# Patient Record
Sex: Female | Born: 1940 | Race: White | Hispanic: No | Marital: Married | Smoking: Never smoker
Health system: Southern US, Academic
[De-identification: ages and names within clinical notes are randomized; demographics above are authoritative.]

## PROBLEM LIST (undated history)

## (undated) DIAGNOSIS — Z973 Presence of spectacles and contact lenses: Secondary | ICD-10-CM

## (undated) DIAGNOSIS — G43909 Migraine, unspecified, not intractable, without status migrainosus: Secondary | ICD-10-CM

## (undated) DIAGNOSIS — M1990 Unspecified osteoarthritis, unspecified site: Secondary | ICD-10-CM

## (undated) DIAGNOSIS — F32A Depression, unspecified: Secondary | ICD-10-CM

## (undated) DIAGNOSIS — K219 Gastro-esophageal reflux disease without esophagitis: Secondary | ICD-10-CM

## (undated) DIAGNOSIS — R6889 Other general symptoms and signs: Secondary | ICD-10-CM

## (undated) DIAGNOSIS — G8929 Other chronic pain: Secondary | ICD-10-CM

## (undated) DIAGNOSIS — M545 Low back pain, unspecified: Secondary | ICD-10-CM

## (undated) DIAGNOSIS — R0602 Shortness of breath: Secondary | ICD-10-CM

## (undated) DIAGNOSIS — R413 Other amnesia: Secondary | ICD-10-CM

## (undated) HISTORY — DX: Depression, unspecified: F32.A

## (undated) HISTORY — DX: Unspecified osteoarthritis, unspecified site: M19.90

## (undated) HISTORY — PX: HX BREAST AUGMENTATION: SHX7

## (undated) HISTORY — DX: Gastro-esophageal reflux disease without esophagitis: K21.9

## (undated) HISTORY — PX: CYSTOURETHROSCOPY: SHX476

## (undated) HISTORY — DX: Other amnesia: R41.3

## (undated) HISTORY — DX: Migraine, unspecified, not intractable, without status migrainosus: G43.909

## (undated) HISTORY — PX: SACROILIAC JOINT INJECTION: SHX2370

## (undated) HISTORY — DX: Other general symptoms and signs: R68.89

## (undated) HISTORY — DX: Low back pain, unspecified: M54.50

## (undated) HISTORY — PX: HX CERVICAL SPINE SURGERY: 2100001197

## (undated) HISTORY — PX: HX ROTATOR CUFF REPAIR: SHX139

## (undated) HISTORY — PX: TOE AMPUTATION: SHX809

## (undated) HISTORY — PX: BREAST IMPLANT REMOVAL: SUR1101

## (undated) HISTORY — PX: FOOT SURGERY: SHX648

---

## 2018-01-06 ENCOUNTER — Ambulatory Visit (HOSPITAL_COMMUNITY)
Admission: RE | Admit: 2018-01-06 | Discharge: 2018-01-06 | Disposition: A | Discharge status: Home / Self Care | Payer: Self-pay | Source: Ambulatory Visit | Admitting: Radiology

## 2018-01-14 ENCOUNTER — Ambulatory Visit (HOSPITAL_COMMUNITY)
Admission: RE | Admit: 2018-01-14 | Discharge: 2018-01-14 | Disposition: A | Discharge status: Home / Self Care | Payer: Self-pay | Source: Ambulatory Visit | Admitting: Radiology

## 2019-05-07 ENCOUNTER — Ambulatory Visit (HOSPITAL_COMMUNITY)
Admission: RE | Admit: 2019-05-07 | Discharge: 2019-05-07 | Disposition: A | Discharge status: Home / Self Care | Payer: Self-pay | Source: Ambulatory Visit | Admitting: Radiology

## 2020-06-20 ENCOUNTER — Ambulatory Visit: Payer: Medicare Other

## 2020-10-13 IMAGING — MR MRI RIGHT SHOULDER WITHOUT CONTRAST
4 of 7 series · 21 of 40 positions shown · IV contrast (gadolinium)
Comparison: None

MRI RIGHT SHOULDER WITHOUT CONTRAST, 10/13/2020 [DATE]: 
CLINICAL INDICATION: Chronic right shoulder pain. Right shoulder surgery 11 
years ago (report not available at this time).
TECHNIQUE: Multiplanar, multiecho position MR images of the right shoulder were 
performed without intravenous gadolinium enhancement.

[Series 101: survey_fullfov_transversal · axial · 10.0mm · 1.84mm/px · z∈[-51,+51]mm · 2 of 7 slices shown]
[im 1/7]
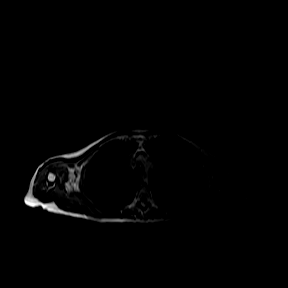
[im 7/7]
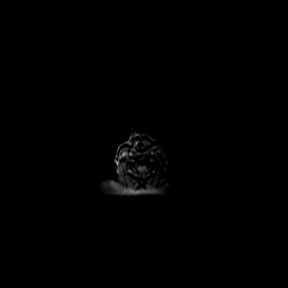

[Series 201: (person_name)2(person_name)_(person_name) · axial · 6.0mm · 0.61mm/px · z∈[-13,+191]mm · 4 of 15 slices shown]
[im 1/15]
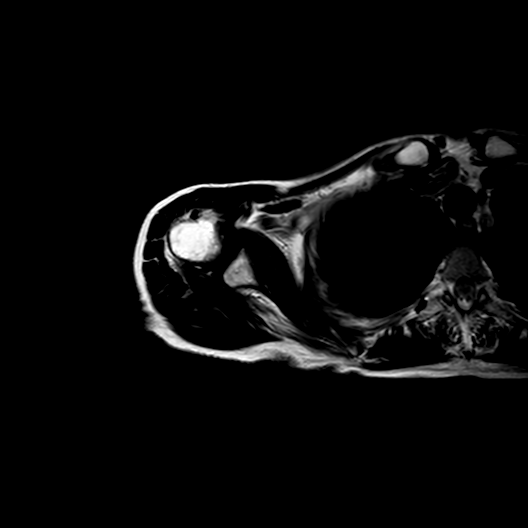
[im 5/15]
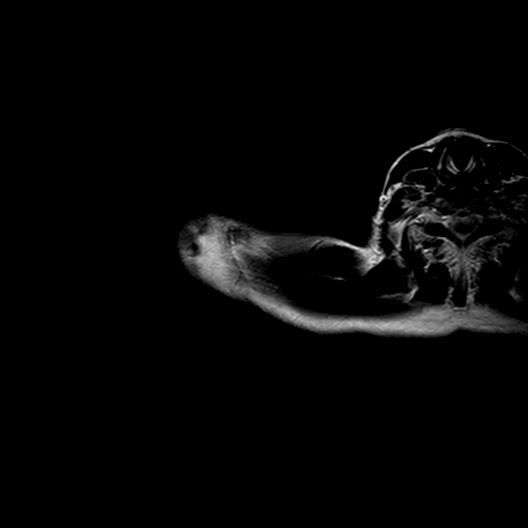
[im 10/15]
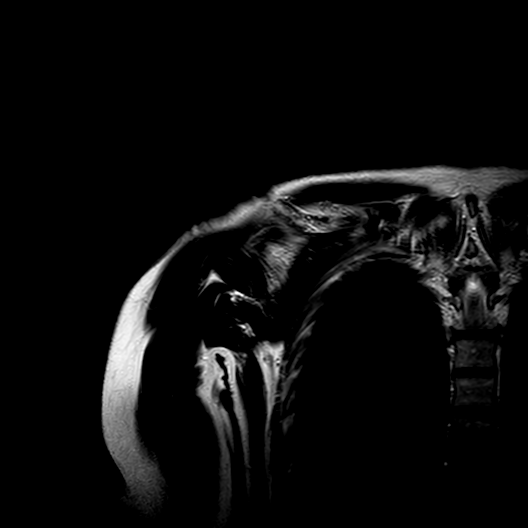
[im 15/15]
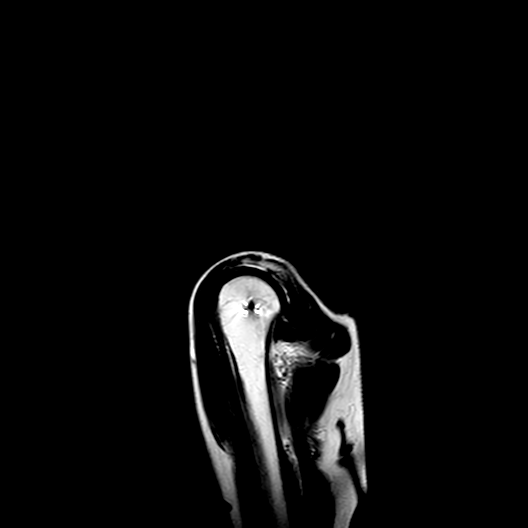

[Series 301: (person_name)_(person_name)_(person_name)* · axial · 3.0mm · 0.35mm/px · z∈[-48,+41]mm · 8 of 28 slices shown]
[im 1/28]
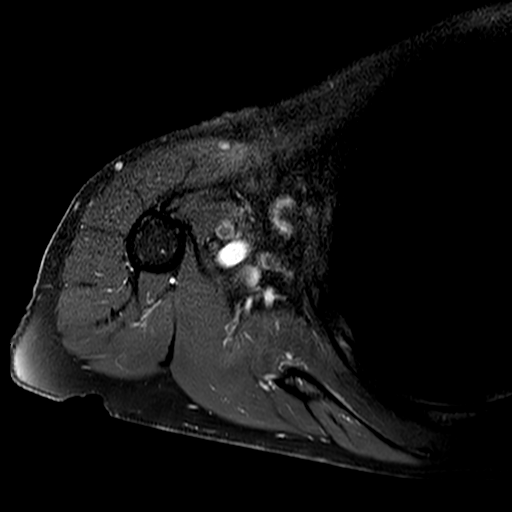
[im 4/28]
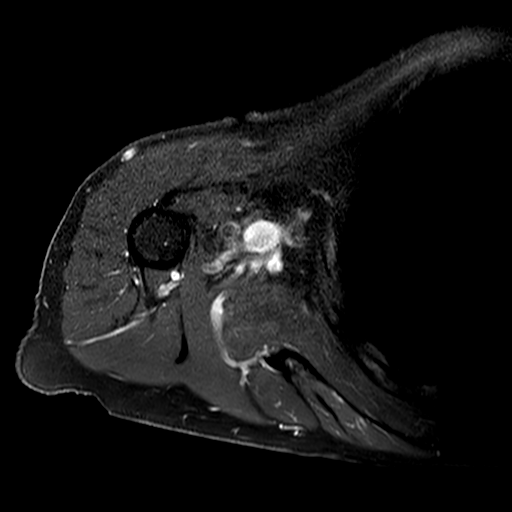
[im 8/28]
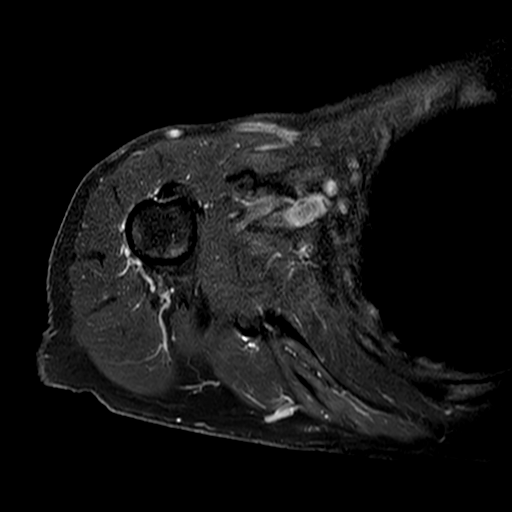
[im 12/28]
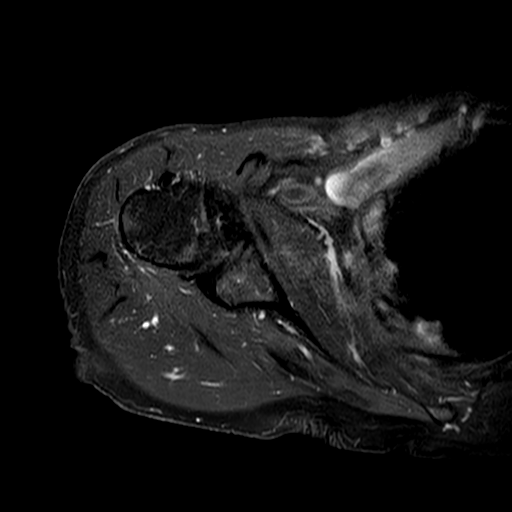
[im 16/28]
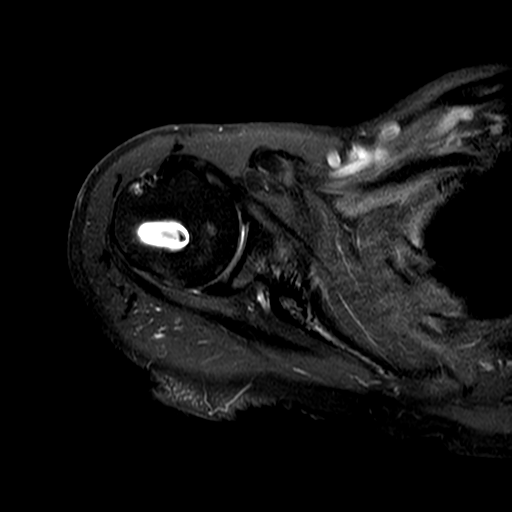
[im 20/28]
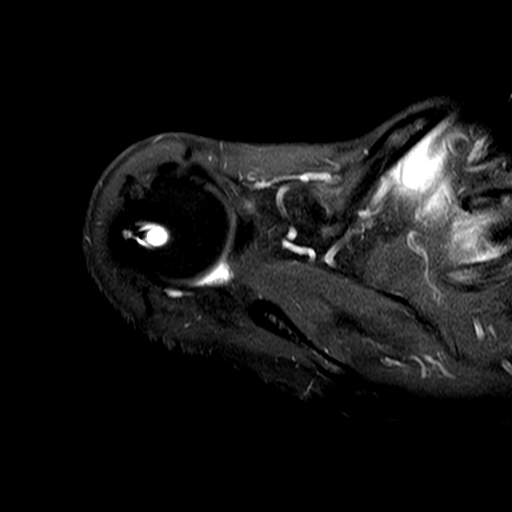
[im 24/28]
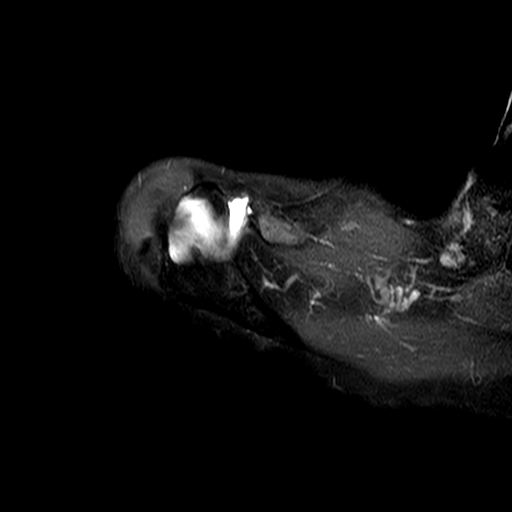
[im 28/28]
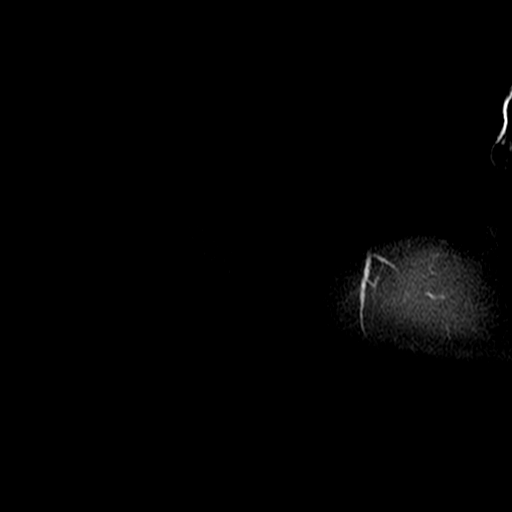

[Series 401: t2_fs_sag* · oblique · 3.0mm · 0.37mm/px · 7 of 24 slices shown]
[im 1/24]
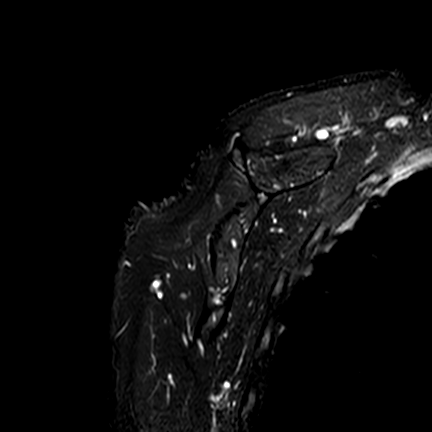
[im 4/24]
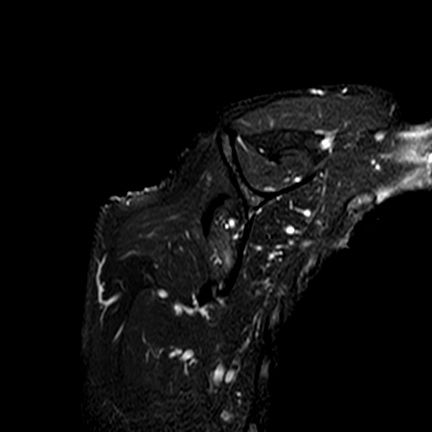
[im 8/24]
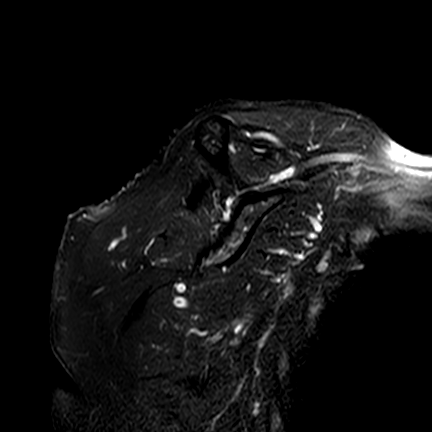
[im 12/24]
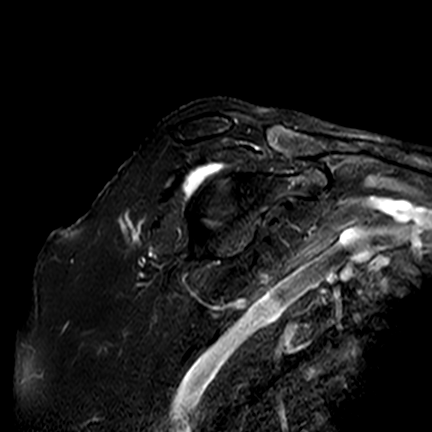
[im 16/24]
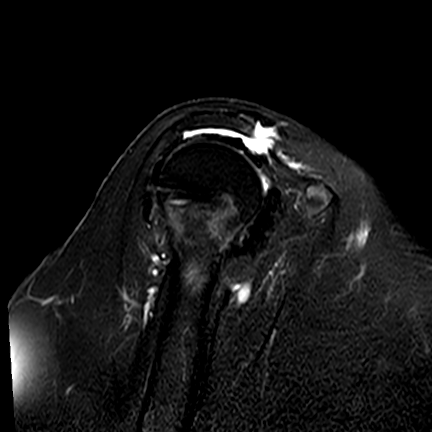
[im 20/24]
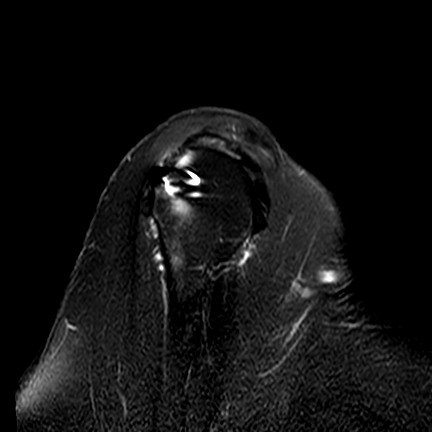
[im 24/24]
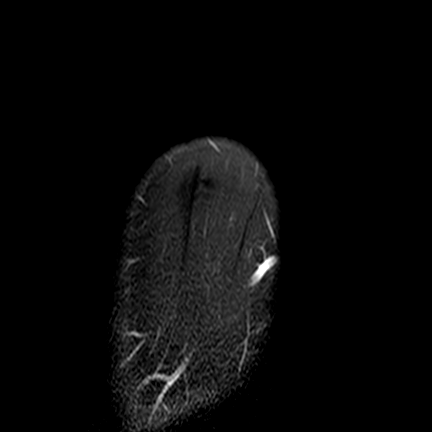

[21 of 40 positions shown; findings below may reference images not displayed]

FINDINGS: ROTATOR CUFF: Status post rotator cuff repair with 0.9 cm segment of marked 
thinning and small full-thickness tears of the reattached superior fibers of the 
infraspinatus tendon (sequence 401 image 7). Small near full-thickness tear of 
the critical zone of the supraspinatus tendon measures 0.4 cm in AP dimensions 
(sequence 601 image 10). The subscapularis and teres minor tendons are 
preserved. Moderate supraspinatus and infraspinatus fatty muscular atrophy. 
ACROMIOCLAVICULAR JOINT: The acromioclavicular joint is preserved. The 
coracoacromial ligament is intact without prominent spurring at the acromial 
attachment. Ruptured inferior acromioclavicular ligament. Coracoclavicular 
ligament is preserved. Acromioplasty. 
GLENOHUMERAL JOINT: The humeral head is well located within the glenoid fossa. 
The glenoid labrum is preserved. No paralabral cyst. The intra-articular portion 
of the long head of the biceps tendon is negative. No shoulder joint effusion. 
BONES AND SOFT TISSUES: Metallic suture anchors in the lateral humeral head. 1 
cm focus of edema-like signal changes within the greater tuberosity of the 
humerus. No fracture. No Hill-Sachs defect. Small humeral head subcortical cyst. 
Small amount of joint fluid and small amount of subacromial/subdeltoid bursal 
fluid, with extension into the AC joint. The axillary region is negative. 
Subcutaneous tissues are negative.
IMPRESSION: 1.  Status post rotator cuff repair and acromioplasty, with 0.9 cm segment of 
marked thinning and small full-thickness tears of the reattached superior fibers 
of the infraspinatus tendon. 
2.  0.4 cm near full-thickness tear of the critical zone of the supraspinatus 
tendon. 
3.  Moderate supraspinatus and infraspinatus fatty muscular atrophy. 
4.  Ruptured inferior acromioclavicular ligament. 
5.  1 cm focus of edema-like signal changes within the greater tuberosity of the 
humerus.

## 2020-12-30 HISTORY — PX: ANTERIOR FUSION CERVICAL SPINE: SUR626

## 2021-01-12 HISTORY — PX: HX CATARACT REMOVAL: SHX102

## 2021-03-27 ENCOUNTER — Ambulatory Visit (HOSPITAL_COMMUNITY)
Admission: RE | Admit: 2021-03-27 | Discharge: 2021-03-27 | Disposition: A | Discharge status: Home / Self Care | Payer: Self-pay | Source: Ambulatory Visit | Admitting: Radiology

## 2021-07-09 IMAGING — MR MRI CERVICAL SPINE WITHOUT CONTRAST
4 of 6 series · 24 of 48 positions shown · IV contrast (gadolinium)
Comparison: None

FINAL Diagnostic Imaging Report 
________________________________________________________________________________________________ 
MRI CERVICAL SPINE WITHOUT CONTRAST, 07/09/2021 [DATE]: 
CLINICAL INDICATION: Spinal stenosis
TECHNIQUE: Multiplanar, multiecho position MR images of the cervical spine were 
performed without intravenous gadolinium enhancement. Patient was scanned on a 
1.5T magnet.

[Series 101: survey* · axial · 10.0mm · 1.56mm/px · z∈[-30,+199]mm · 7 of 15 slices shown]
[im 1/15]
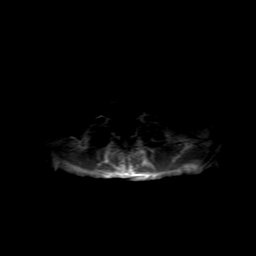
[im 3/15]
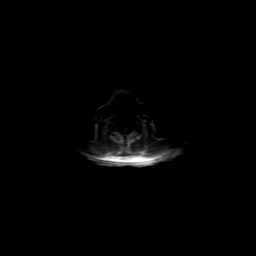
[im 5/15]
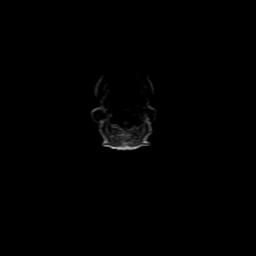
[im 8/15]
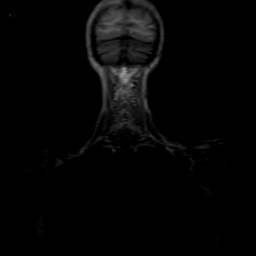
[im 10/15]
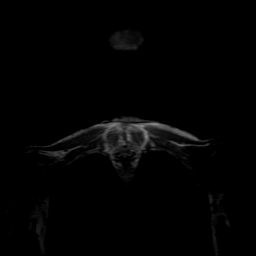
[im 12/15]
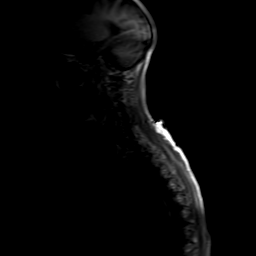
[im 15/15]
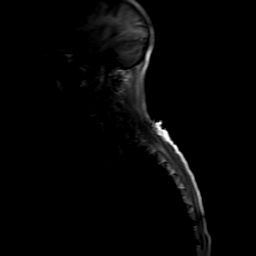

[Series 201: t2w_cor-surv · coronal · 5.0mm · 0.85mm/px · 3 of 7 slices shown]
[im 1/7]
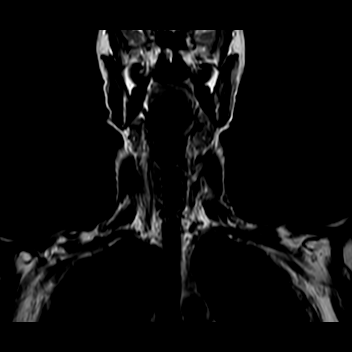
[im 4/7]
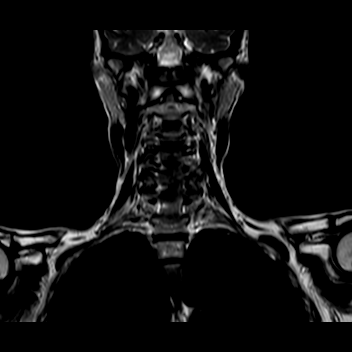
[im 7/7]
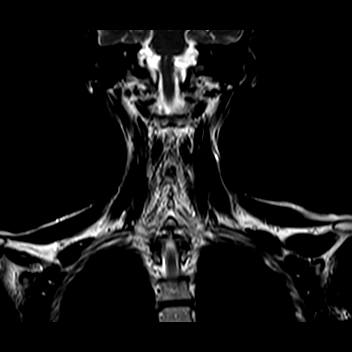

[Series 301: t1_(person_name) · sagittal · 3.0mm · 0.42mm/px · 7 of 15 slices shown]
[im 1/15]
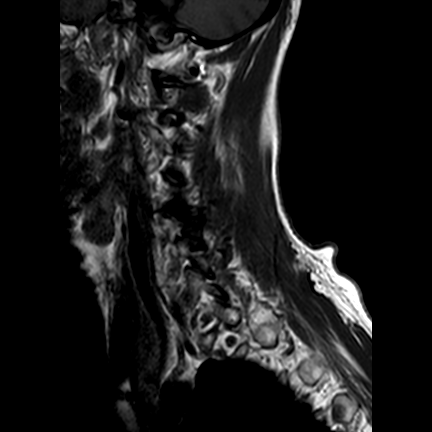
[im 3/15]
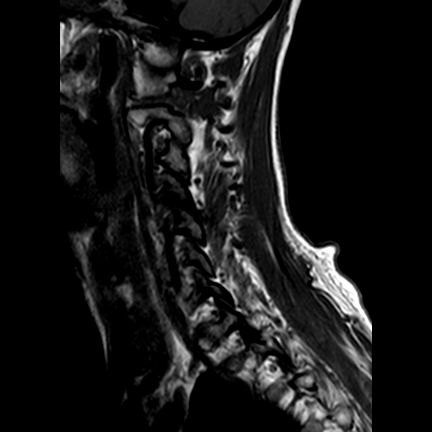
[im 5/15]
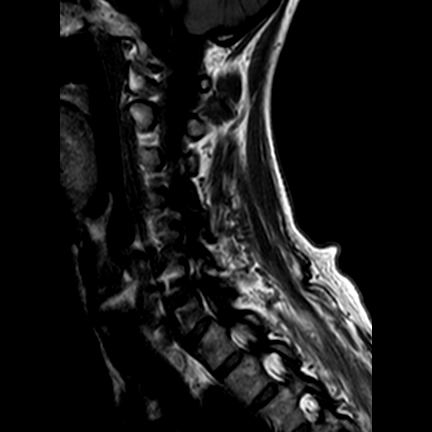
[im 8/15]
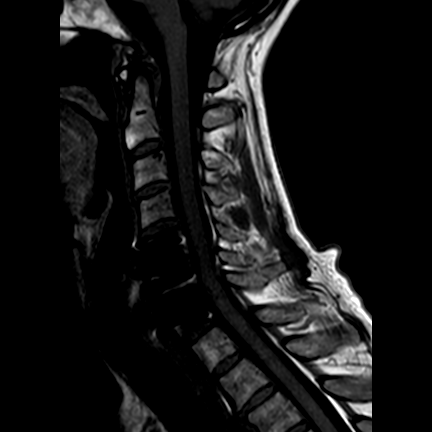
[im 10/15]
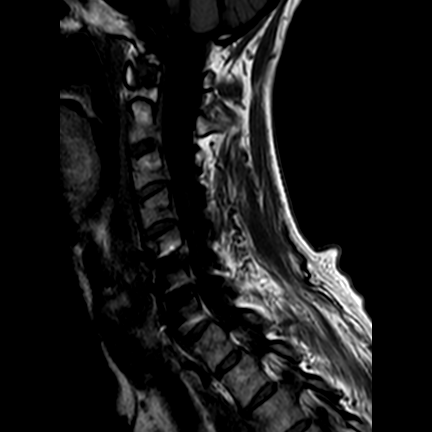
[im 12/15]
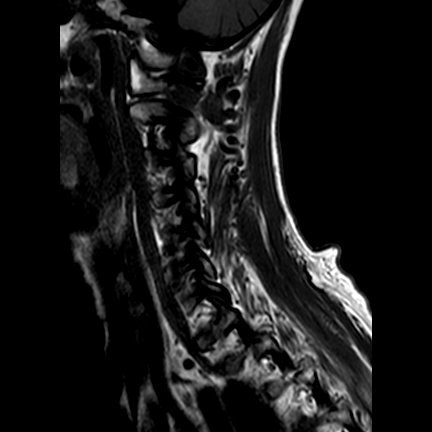
[im 15/15]
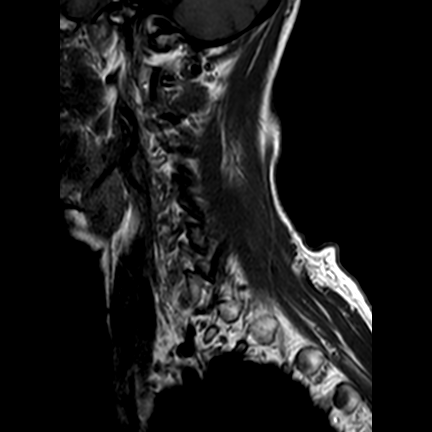

[Series 401: t2w_mv_xd_sag · sagittal · 3.0mm · 0.31mm/px · 7 of 15 slices shown]
[im 1/15]
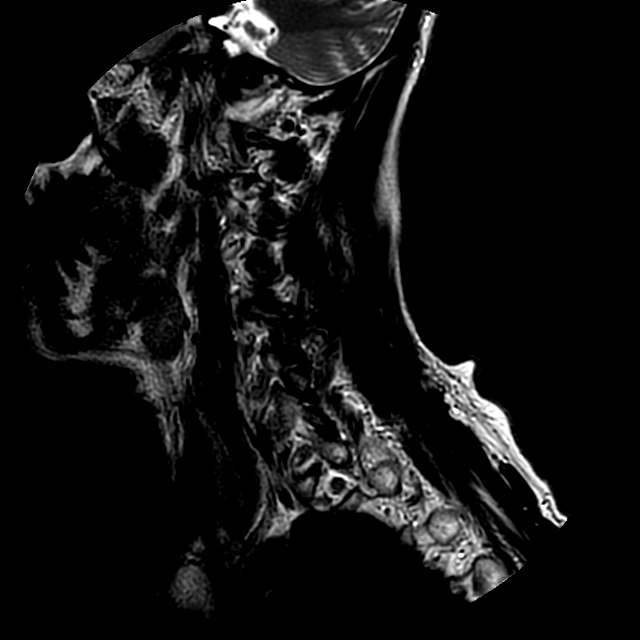
[im 3/15]
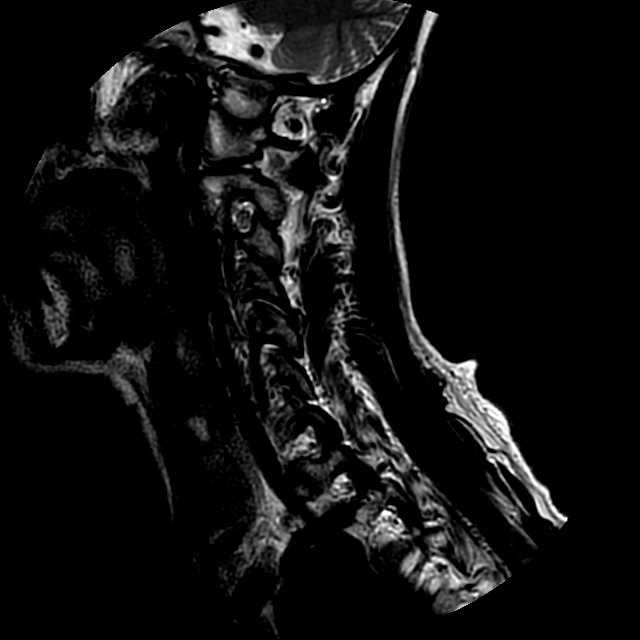
[im 5/15]
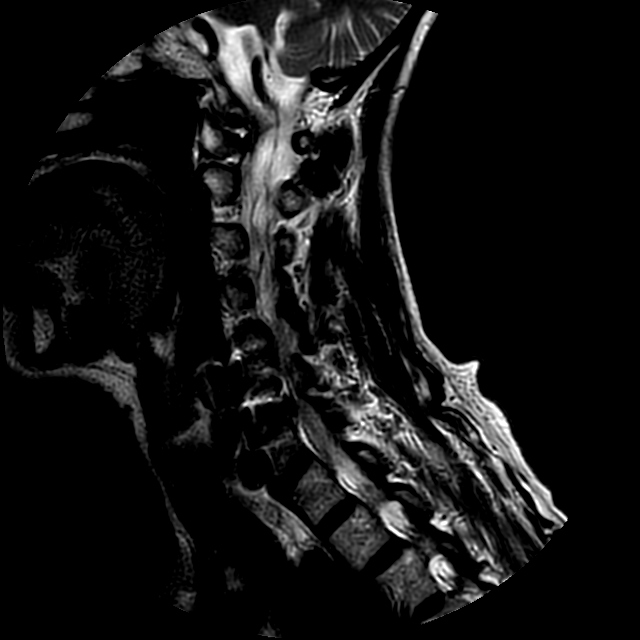
[im 8/15]
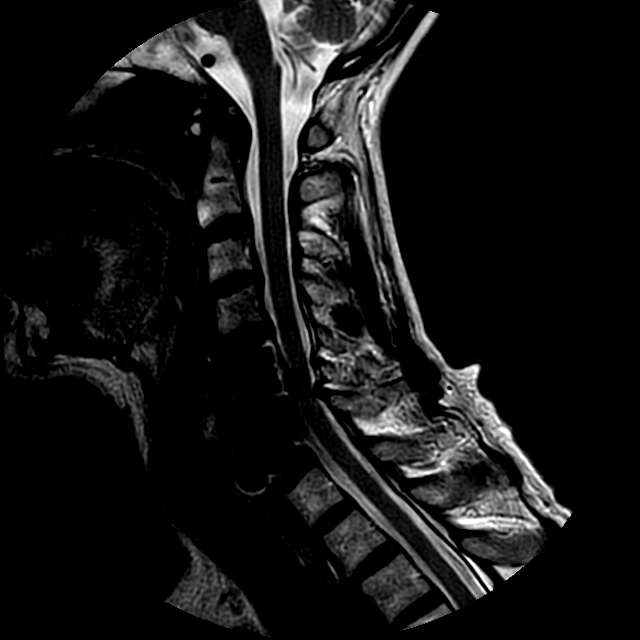
[im 10/15]
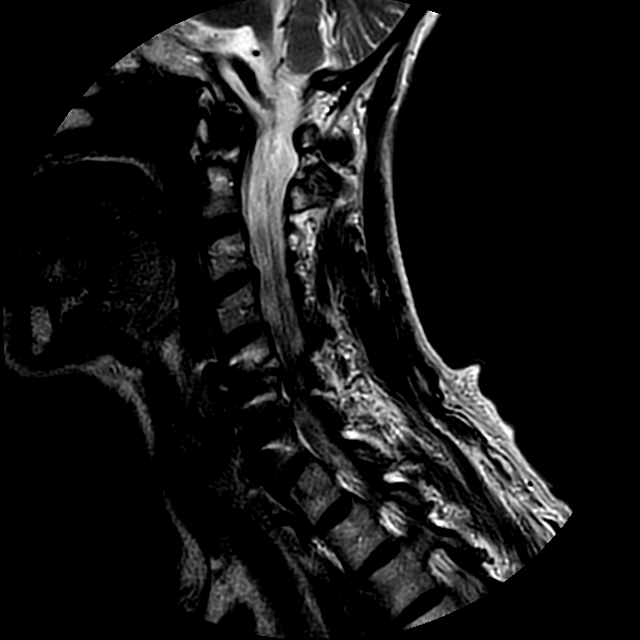
[im 12/15]
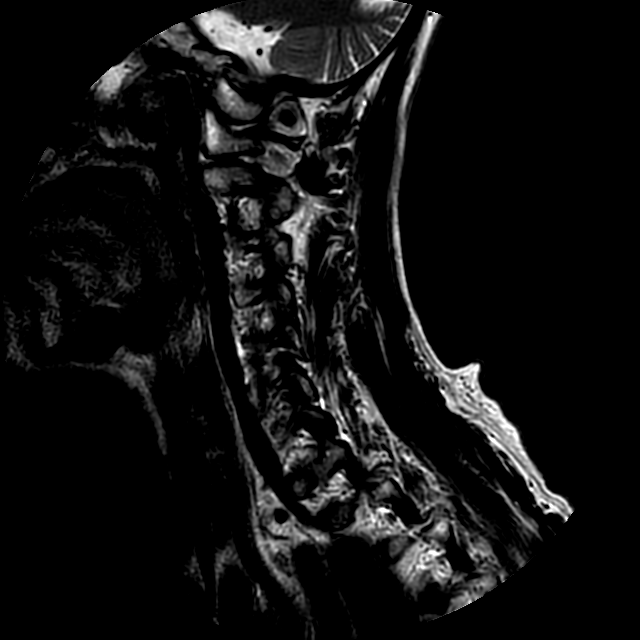
[im 15/15]
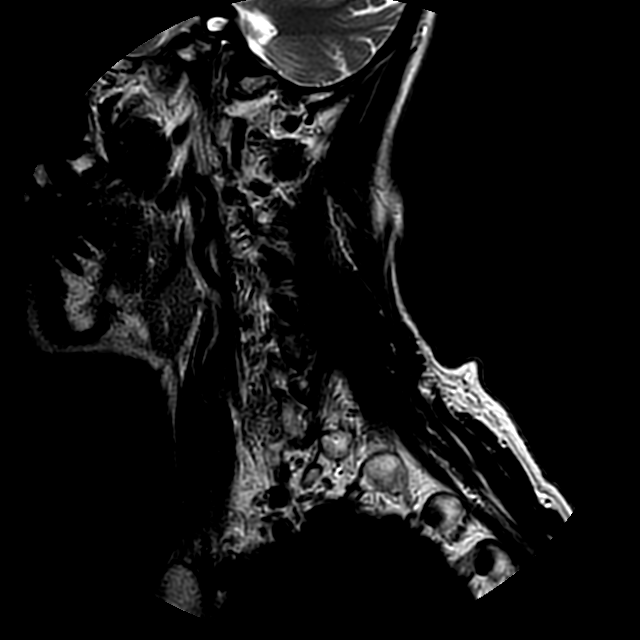

[24 of 48 positions shown; findings below may reference images not displayed]

FINDINGS: Patient is status post C5-C7 ACDF with anterior plate and screw 
fusion. Susceptibility artifact limits detail. Hardware appears grossly 
well-positioned. 
There is moderately severe canal stenosis opposite C5-6 due to posterior 
osteophyte and dorsal ligament is thickening, with cord deformity. Canal 
diameter proximally 6 mm measured on sagittal T2 image 9, 5.5 mm on sagittal 
image 8. There is moderate left, mild right foraminal stenosis at C5-6, axial 
image 21. At C6-7 there appears to be moderate left, mild right foraminal 
stenosis, image 16. 
At C2-3 the canal and foramina are open. 
At C3-4 there is mild disc bulge not touching the cord. Mid sagittal canal 
diameter is 9 mm, mildly stenotic. Foramina are open. 
At C4-5 there is mild disc bulge approximating the cord without deformity. The 
canal is borderline narrow. Foramina are open. 
Opposite C6-7 Canal diameter is 8 mm, mildly stenotic. 
At C7-T1 the canal and foramina are open. 
The dens is intact. No evidence for fracture or malignancy. Cord signal appears 
normal.
IMPRESSION: Patient is status post C5-C7 ACDF with plate and screw fusion. Allowing for 
susceptibility artifact hardware appears appropriately positioned. 
There is moderately severe canal stenosis opposite C5-6, with deformity of the 
ventral and dorsal cord. There is foraminal stenosis at C5-6 and C6-7, moderate 
on the left, mild on the right at both levels.

## 2021-07-10 IMAGING — CT CT CERVICAL SPINE WITHOUT CONTRAST
5 series · 15 of 33 positions shown, 17 images · non-contrast
Comparison: MRI cervical spine from June 09, 2021.

FINAL Diagnostic Imaging Report 
________________________________________________________________________________________________ 
CT CERVICAL SPINE WITHOUT CONTRAST, 07/10/2021 [DATE]: 
CLINICAL INDICATION: Cervical fusion. 
A search for DICOM formatted images was conducted for prior CT imaging studies 
completed at a non-affiliated media free facility.
TECHNIQUE: The cervical spine was scanned from the skull base through T1 
vertebra without contrast on a high-resolution CT scanner using dose reduction 
techniques. Routing MPR reconstructions were performed.

[Series 3: axial · axial · 0.33mm/px · z∈[-171,-101]mm · 2 of 107 slices shown]
[im 36/107  bone]
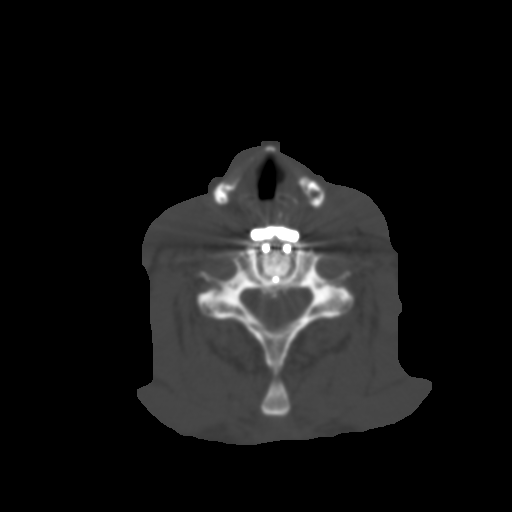
[im 71/107  bone]
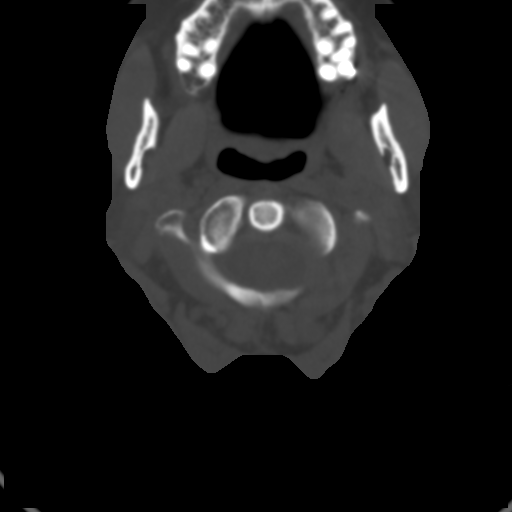

[Series 5: axial (person_name) · axial · 0.33mm/px · z∈[-189,-83]mm · 3 of 107 slices shown, 4 images]
[im 27/107  soft-tissue]
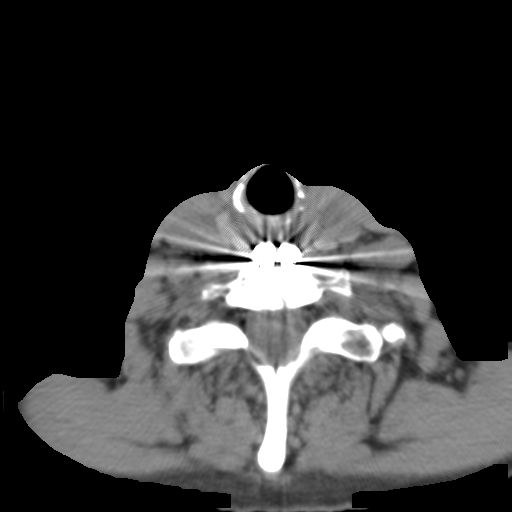
[im 27/107  bone]
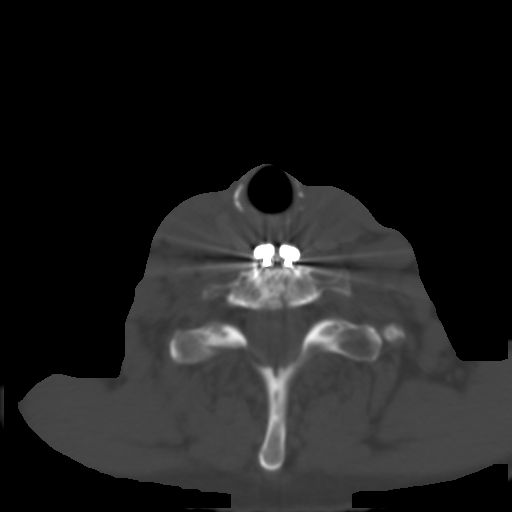
[im 54/107  bone]
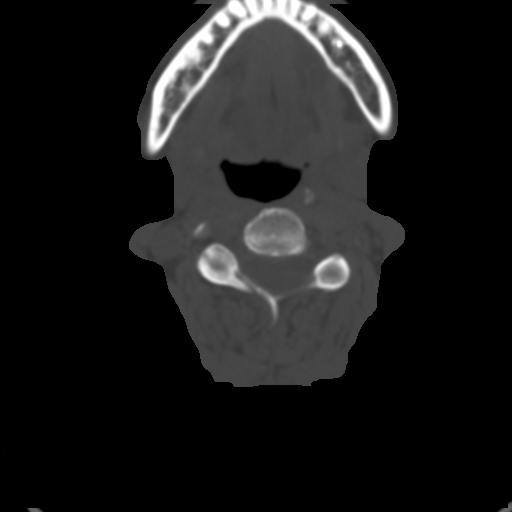
[im 80/107  bone]
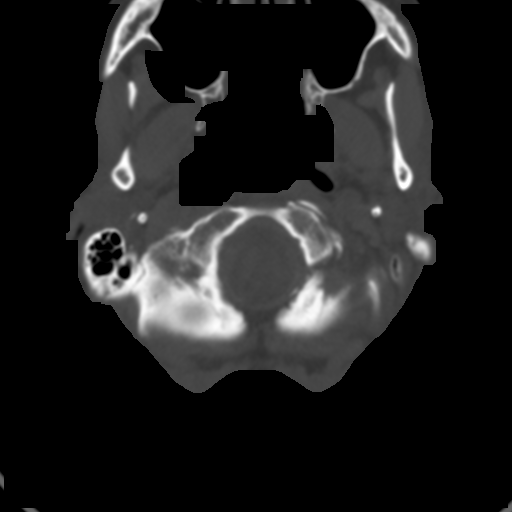

[Series 7: cor · coronal · 0.32mm/px · 3 of 66 slices shown]
[im 14/66  bone]
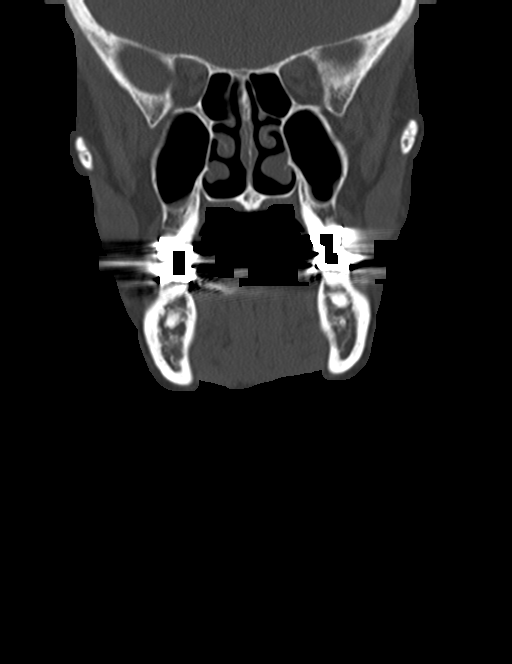
[im 27/66  bone]
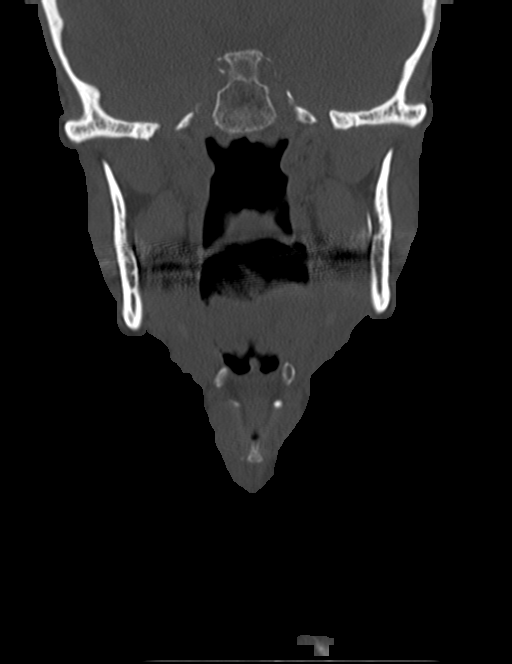
[im 40/66  bone]
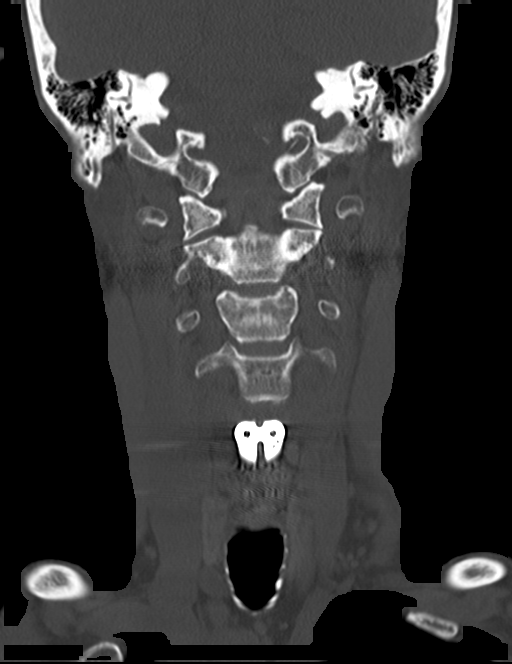

[Series 9: sag st · sagittal · 0.37mm/px · 5 of 76 slices shown, 6 images]
[im 26/76  bone]
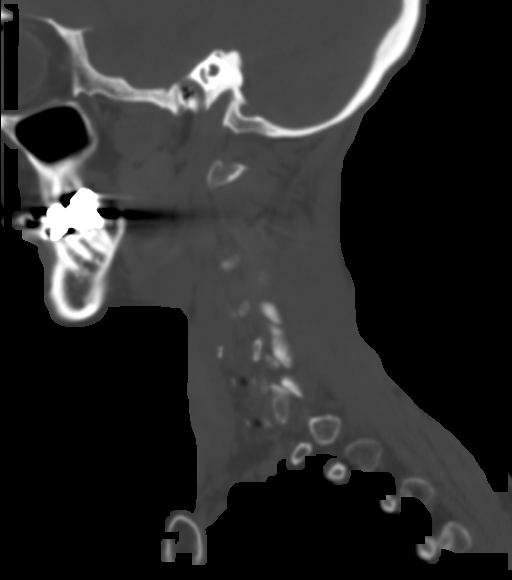
[im 32/76  bone]
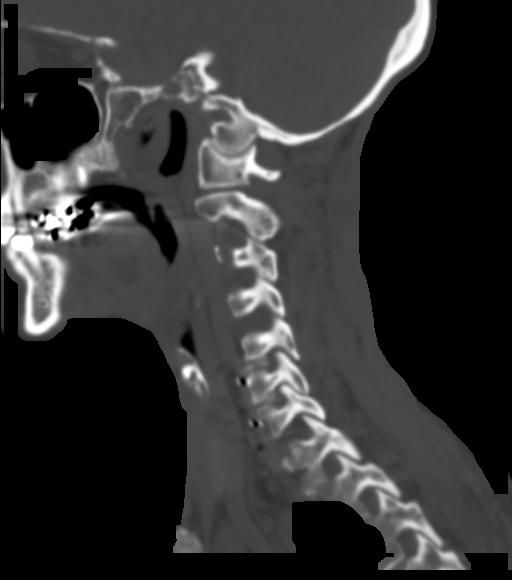
[im 38/76  soft-tissue]
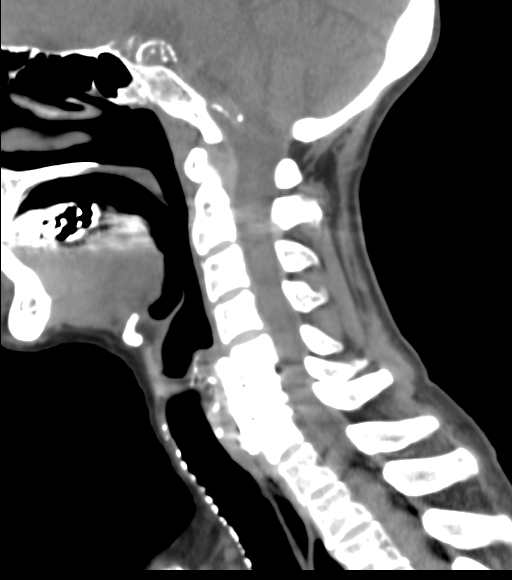
[im 38/76  bone]
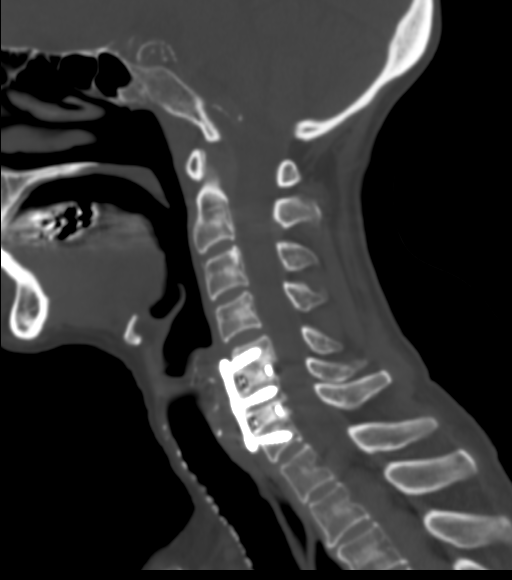
[im 44/76  bone]
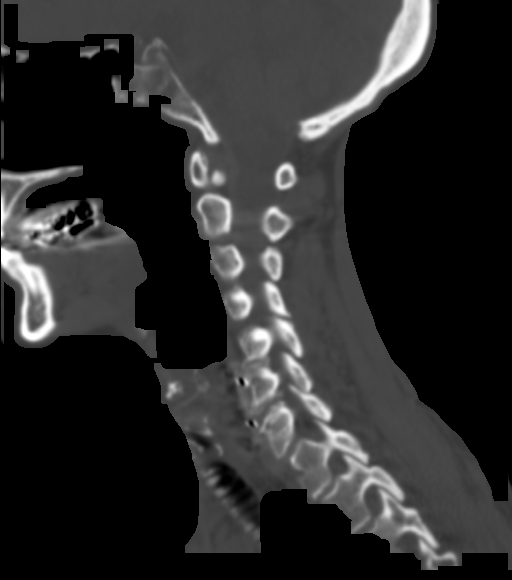
[im 51/76  bone]
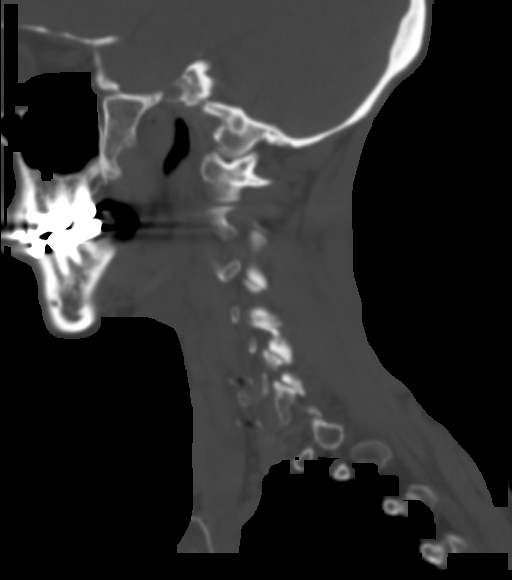

[Series 11: st multi (person_name) · axial · 0.29mm/px · z∈[-230,-189]mm · 2 of 118 slices shown]
[im 30/118  bone]
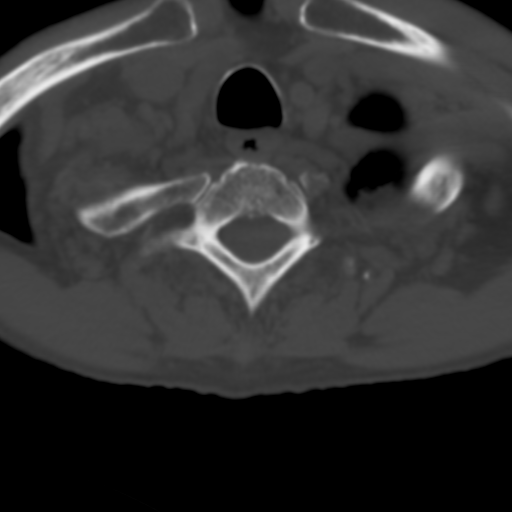
[im 59/118  bone]
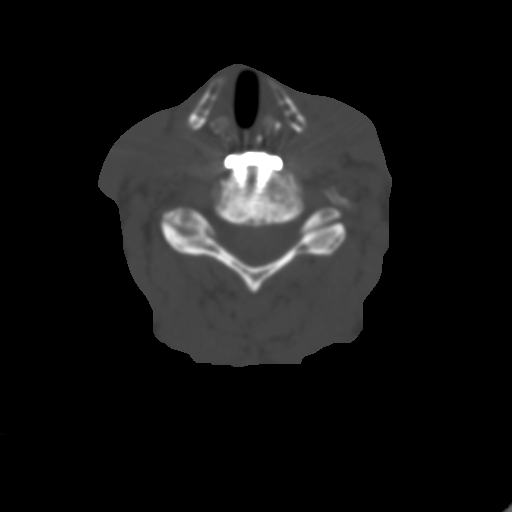

[15 of 33 positions shown; findings below may reference images not displayed]

FINDINGS: --------------------------------------------------------------------------- 
Postsurgical changes of anterior cervical discectomy and fusion from C5 through 
C7. There is no evidence of hardware fracture. No evidence of hardware 
loosening. Within the intervertebral spacer there is evidence of bony fusion 
across the disc spaces at C5-C6 and C6-C7. 
--------------------------------------------------------------------------- 
Alignment of cervical spine demonstrates mild anterolisthesis of C4 relative to 
C5. No acute cervical spine fracture. No focal suspect lytic or blastic lesions. 
Visualized extraspinal soft tissues are unremarkable. The lung apices are clear. 
Discogenic/degenerative changes, stable from recent MRI, please see that report 
for level by level details. 
---------------------------------------------------------------------------
IMPRESSION: Postsurgical changes of ACDF from C5 through C7 without evidence of hardware 
failure. No acute fracture. 
RADIATION DOSE REDUCTION: All CT scans are performed using radiation dose 
reduction techniques, when applicable.  Technical factors are evaluated and 
adjusted to ensure appropriate moderation of exposure.  Automated dose 
management technology is applied to adjust the radiation doses to minimize 
exposure while achieving diagnostic quality images.

## 2021-07-24 IMAGING — MR MRI LUMBAR SPINE WITHOUT CONTRAST
5 of 8 series · 20 of 48 positions shown · IV contrast (gadolinium)
Comparison: None.

FINAL Diagnostic Imaging Report 
________________________________________________________________________________________________ 
MRI LUMBAR SPINE WITHOUT CONTRAST, 07/24/2021 [DATE]: 
CLINICAL INDICATION: Chronic low back pain radiating down bilateral legs to the 
toes.
TECHNIQUE: Multiplanar, multiecho position MR images of the lumbar spine were 
performed without intravenous gadolinium enhancement. Patient was scanned on a 
1.5T magnet.

[Series 101: survey · axial · 10.0mm · 1.39mm/px · z∈[-33,+201]mm · 2 of 10 slices shown]
[im 1/10]
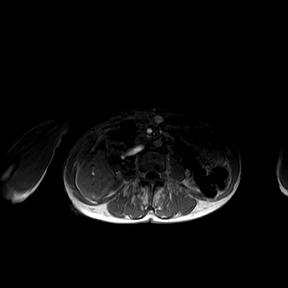
[im 10/10]
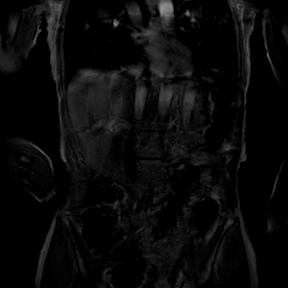

[Series 201: t2w_cor-surv · coronal · 6.0mm · 0.60mm/px · 2 of 5 slices shown]
[im 1/5]
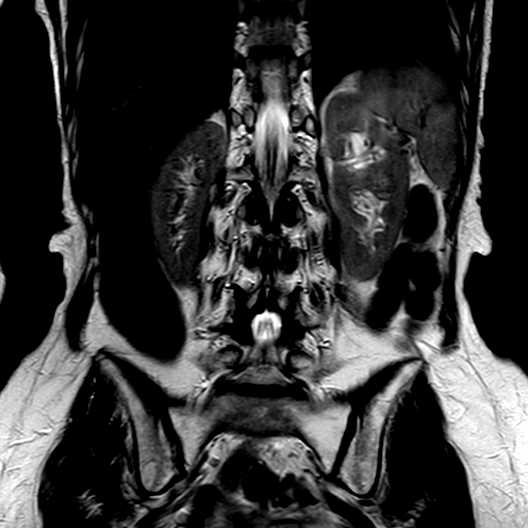
[im 5/5]
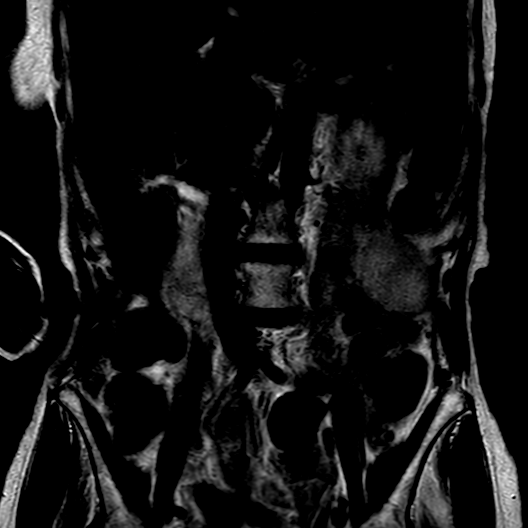

[Series 301: t1_tse_sag · sagittal · 4.0mm · 0.44mm/px · 5 of 15 slices shown]
[im 1/15]
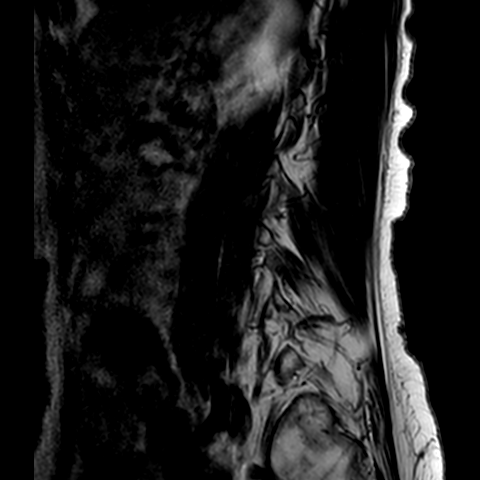
[im 4/15]
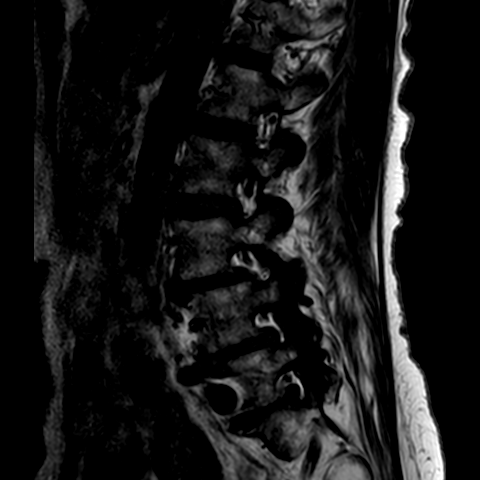
[im 8/15]
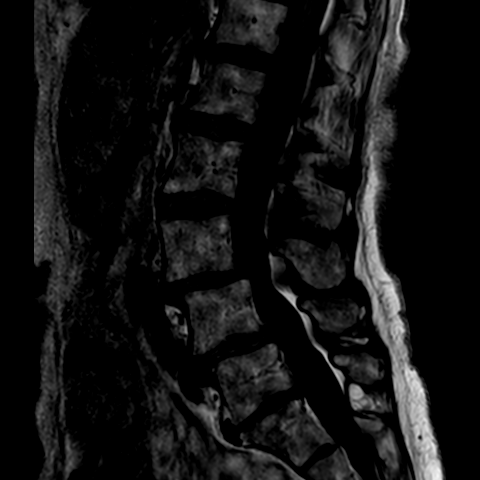
[im 11/15]
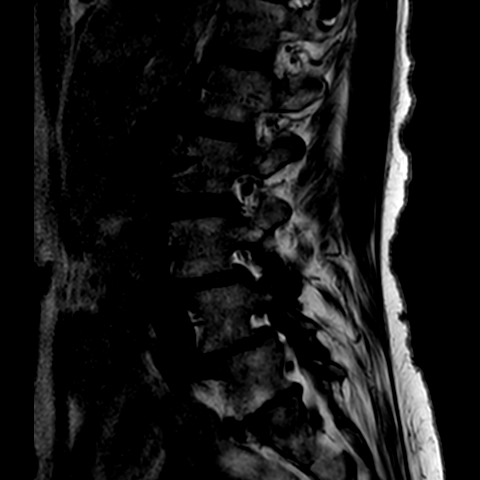
[im 15/15]
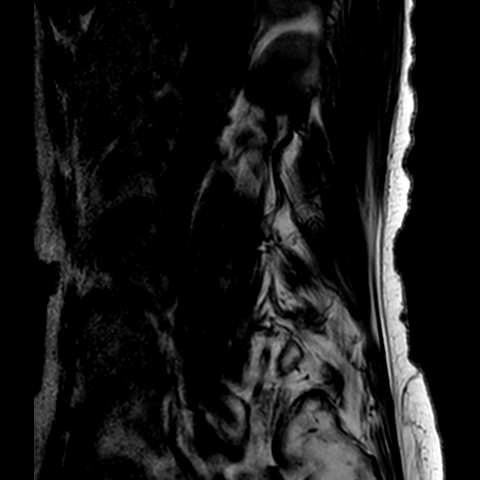

[Series 401: t2_tse_sag · sagittal · 4.0mm · 0.38mm/px · 3 of 15 slices shown]
[im 1/15]
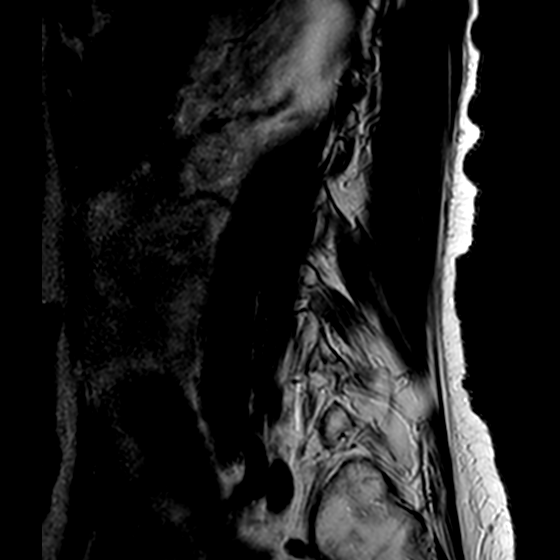
[im 4/15]
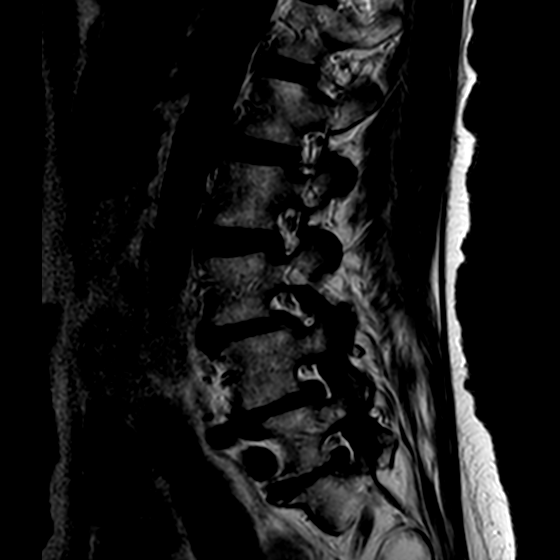
[im 8/15]
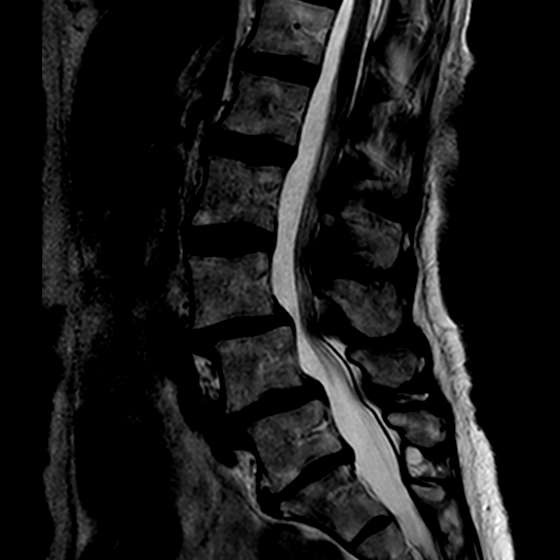

[Series 701: T1 · axial · 4.0mm · 0.38mm/px · z∈[-135,+55]mm · 8 of 27 slices shown]
[im 1/27]
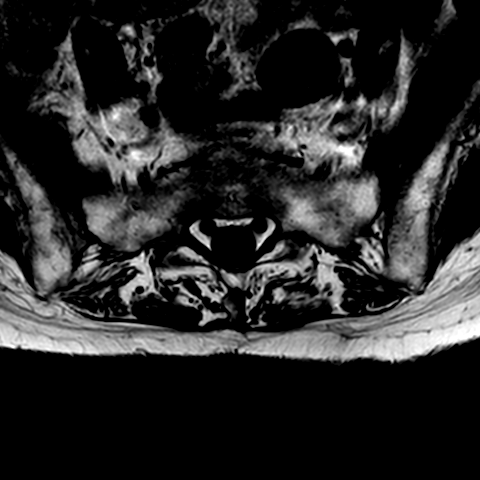
[im 4/27]
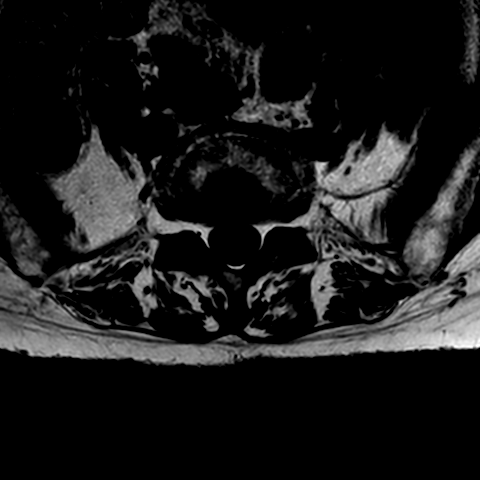
[im 8/27]
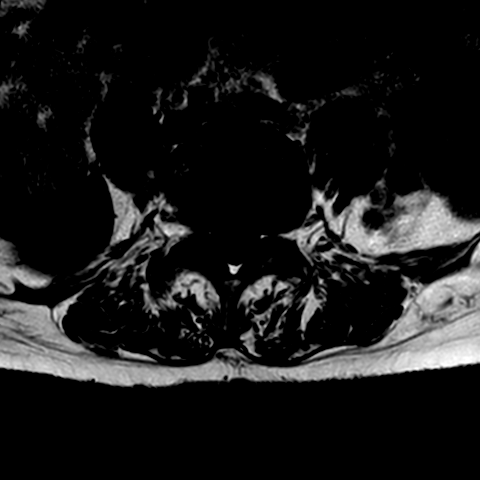
[im 12/27]
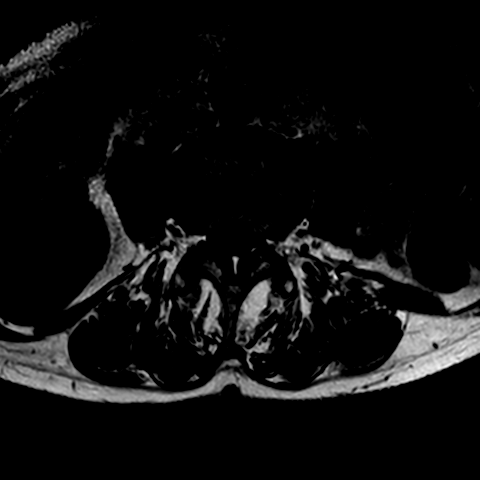
[im 15/27]
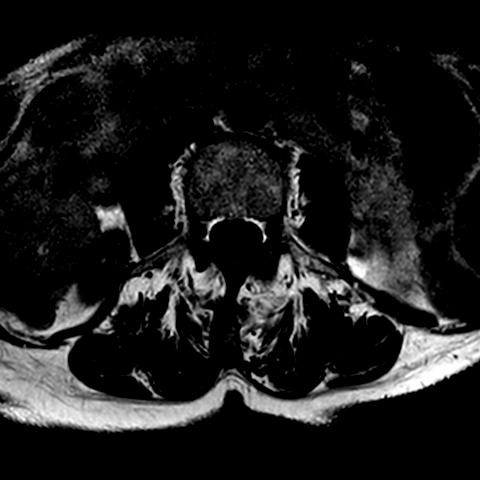
[im 19/27]
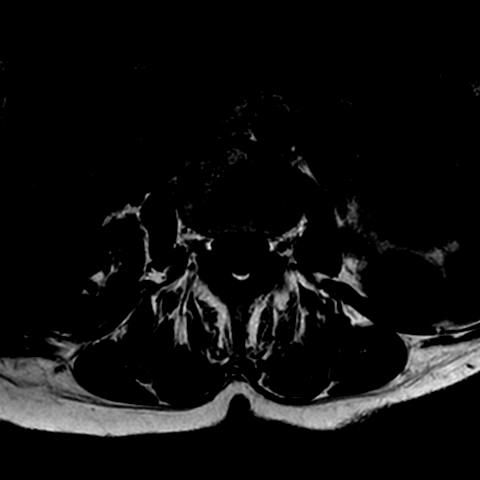
[im 23/27]
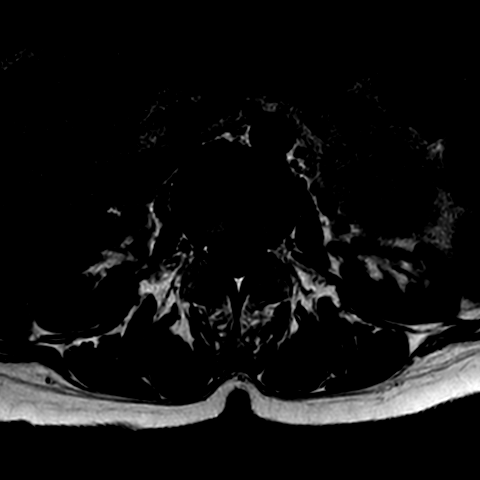
[im 27/27]
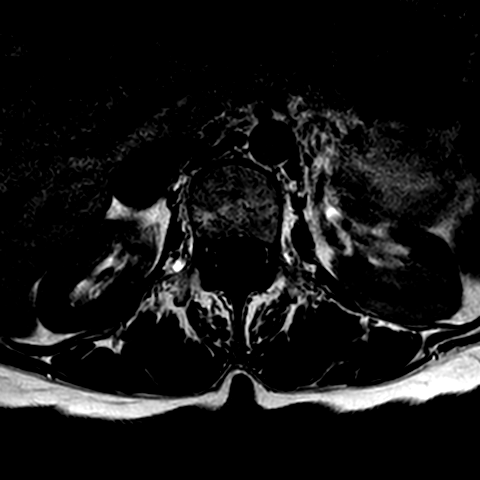

[20 of 48 positions shown; findings below may reference images not displayed]

FINDINGS: --------------------------------------------------------------------------- 
General: 
Alignment demonstrates 6 mm anterolisthesis of L3 relative to L4 and 5 mm of 
anterolisthesis of L4 relative to L5. Minimal retrolisthesis of L1 relative to 
L2 No focal suspect marrow signal abnormality. Conus medullaris is normal in 
size and signal intensity, terminating at L2 superior endplate level. Visualized 
extraspinal soft tissues are unremarkable. 
Modic I-II levels: None. 
Ligamentum flavum > 2.5 mm levels: All levels. 
--------------------------------------------------------------------------- 
Segmental: 
T12-L1: No significant central canal or neural foraminal narrowing. 
L1-L2: No significant central canal or neural foraminal narrowing. 
L2-L3: Trace disc bulge. No significant central canal or neural foraminal 
narrowing. 
L3-L4: Bilateral facet and ligamentum flavum hypertrophy. Loss of disc height. 
Uncovering of the disc space. Mild central canal narrowing with moderate 
bilateral subarticular recess narrowing. No significant neural foraminal 
narrowing. 
L4-L5: Bilateral facet and ligamentum flavum hypertrophy. Loss of disc height. 
Uncovering of the disc space. No significant central canal narrowing noting 
moderate bilateral subarticular recess narrowing. Mild left neural foraminal 
narrowing. Mild right neural foraminal narrowing. 
L5-S1: Loss of disc height. Disc bulge, eccentric towards the left side. Mild 
bilateral facet hypertrophy. No significant central canal narrowing. Moderate 
left neural foraminal narrowing. Mild right neural foraminal narrowing. 
---------------------------------------------------------------------------
IMPRESSION: 1.  Discogenic/degenerative changes throughout the lumbar spine as above. 
2.  Moderate bilateral subarticular recess narrowing at L3-L4 and L4-L5. 
3.  Moderate left neural foraminal narrowing at the L5-S1 level.

## 2021-09-13 ENCOUNTER — Ambulatory Visit: Payer: Medicare Other | Attending: Internal Medicine | Admitting: Internal Medicine

## 2021-09-13 ENCOUNTER — Ambulatory Visit (HOSPITAL_BASED_OUTPATIENT_CLINIC_OR_DEPARTMENT_OTHER): Payer: Medicare Other

## 2021-09-13 ENCOUNTER — Encounter (HOSPITAL_BASED_OUTPATIENT_CLINIC_OR_DEPARTMENT_OTHER): Payer: Self-pay | Admitting: Internal Medicine

## 2021-09-13 ENCOUNTER — Other Ambulatory Visit: Payer: Self-pay

## 2021-09-13 VITALS — BP 138/72 | HR 98 | Temp 97.5°F | Ht 63.31 in | Wt 104.5 lb

## 2021-09-13 DIAGNOSIS — E162 Hypoglycemia, unspecified: Secondary | ICD-10-CM

## 2021-09-13 DIAGNOSIS — R55 Syncope and collapse: Secondary | ICD-10-CM

## 2021-09-13 DIAGNOSIS — E46 Unspecified protein-calorie malnutrition: Secondary | ICD-10-CM

## 2021-09-13 DIAGNOSIS — G43909 Migraine, unspecified, not intractable, without status migrainosus: Secondary | ICD-10-CM

## 2021-09-13 DIAGNOSIS — Z411 Encounter for cosmetic surgery: Secondary | ICD-10-CM

## 2021-09-13 DIAGNOSIS — R1310 Dysphagia, unspecified: Secondary | ICD-10-CM

## 2021-09-13 DIAGNOSIS — R0789 Other chest pain: Secondary | ICD-10-CM

## 2021-09-13 DIAGNOSIS — M1990 Unspecified osteoarthritis, unspecified site: Secondary | ICD-10-CM

## 2021-09-13 DIAGNOSIS — H547 Unspecified visual loss: Secondary | ICD-10-CM

## 2021-09-13 DIAGNOSIS — N898 Other specified noninflammatory disorders of vagina: Secondary | ICD-10-CM

## 2021-09-13 DIAGNOSIS — Z0000 Encounter for general adult medical examination without abnormal findings: Secondary | ICD-10-CM

## 2021-09-13 DIAGNOSIS — J3489 Other specified disorders of nose and nasal sinuses: Secondary | ICD-10-CM

## 2021-09-13 DIAGNOSIS — K219 Gastro-esophageal reflux disease without esophagitis: Secondary | ICD-10-CM

## 2021-09-13 DIAGNOSIS — I781 Nevus, non-neoplastic: Secondary | ICD-10-CM

## 2021-09-13 LAB — CBC WITH DIFF
BASOPHIL #: <0.1 10*3/uL (ref <=0.20)
BASOPHIL %: 1 %
EOSINOPHIL #: 0.1 10*3/uL (ref <=0.50)
EOSINOPHIL %: 1 %
HCT: 36.5 % (ref 34.8–46.0)
HGB: 11.6 g/dL (ref 11.5–16.0)
IMMATURE GRANULOCYTE #: <0.1 10*3/uL (ref <0.10)
IMMATURE GRANULOCYTE %: 0 % (ref 0–1)
LYMPHOCYTE #: 0.72 10*3/uL — ABNORMAL LOW (ref 1.00–4.80)
LYMPHOCYTE %: 8 %
MCH: 29.4 pg (ref 26.0–32.0)
MCHC: 31.8 g/dL (ref 31.0–35.5)
MCV: 92.4 fL (ref 78.0–100.0)
MONOCYTE #: 0.35 10*3/uL (ref 0.20–1.10)
MONOCYTE %: 4 %
MPV: 9.3 fL (ref 8.7–12.5)
NEUTROPHIL #: 7.55 10*3/uL (ref 1.50–7.70)
NEUTROPHIL %: 86 %
PLATELETS: 182 10*3/uL (ref 150–400)
RBC: 3.95 10*6/uL (ref 3.85–5.22)
RDW-CV: 14.5 % (ref 11.5–15.5)
WBC: 8.8 10*3/uL (ref 3.7–11.0)

## 2021-09-13 LAB — COMPREHENSIVE METABOLIC PANEL, NON-FASTING
ALBUMIN: 3.8 g/dL (ref 3.4–4.8)
ALKALINE PHOSPHATASE: 50 U/L — ABNORMAL LOW (ref 55–145)
ALT (SGPT): 15 U/L (ref 8–22)
ANION GAP: 8 mmol/L (ref 4–13)
AST (SGOT): 22 U/L (ref 8–45)
BILIRUBIN TOTAL: 0.4 mg/dL (ref 0.3–1.3)
BUN/CREA RATIO: 22 (ref 6–22)
BUN: 21 mg/dL (ref 8–25)
CALCIUM: 8.9 mg/dL (ref 8.8–10.2)
CHLORIDE: 97 mmol/L (ref 96–111)
CO2 TOTAL: 27 mmol/L (ref 23–31)
CREATININE: 0.95 mg/dL (ref 0.60–1.05)
ESTIMATED GFR: 61 mL/min/BSA (ref >=60)
GLUCOSE: 154 mg/dL — ABNORMAL HIGH (ref 65–125)
POTASSIUM: 4.3 mmol/L (ref 3.5–5.1)
PROTEIN TOTAL: 7.2 g/dL (ref 6.0–8.0)
SODIUM: 132 mmol/L — ABNORMAL LOW (ref 136–145)

## 2021-09-13 LAB — LIPID PANEL
CHOL/HDL RATIO: 3.2
CHOLESTEROL: 230 mg/dL — ABNORMAL HIGH (ref 100–200)
HDL CHOL: 72 mg/dL (ref >=50)
LDL CALC: 125 mg/dL — ABNORMAL HIGH (ref <100)
NON-HDL: 158 mg/dL (ref <=190)
TRIGLYCERIDES: 167 mg/dL — ABNORMAL HIGH (ref <150)
VLDL CALC: 33 mg/dL — ABNORMAL HIGH (ref <30)

## 2021-09-13 LAB — FOLATE: FOLATE: 10.8 ng/mL (ref 7.0–31.0)

## 2021-09-13 LAB — POC BLOOD GLUCOSE (RESULTS): GLUCOSE, POC: 90 mg/dl (ref 70–105)

## 2021-09-13 LAB — VITAMIN B12: VITAMIN B 12: 438 pg/mL (ref 200–900)

## 2021-09-13 MED ORDER — OXYCODONE 10 MG TABLET
10.0000 mg | ORAL_TABLET | Freq: Four times a day (QID) | ORAL | 0 refills | Status: DC | PRN
Start: 2021-09-13 — End: 2021-10-03

## 2021-09-13 MED ORDER — GUAIFENESIN ER 600 MG TABLET, EXTENDED RELEASE 12 HR
1200.0000 mg | EXTENDED_RELEASE_TABLET | Freq: Two times a day (BID) | ORAL | Status: DC
Start: 2021-09-13 — End: 2021-12-27

## 2021-09-13 MED ORDER — BUTALBITAL-ACETAMINOPHEN-CAFFEINE 50 MG-300 MG-40 MG CAPSULE
1.0000 | ORAL_CAPSULE | Freq: Every day | ORAL | 0 refills | Status: DC | PRN
Start: 2021-09-13 — End: 2021-10-23

## 2021-09-13 MED ORDER — TURMERIC ROOT EXTRACT 500 MG TABLET
500.0000 mg | ORAL_TABLET | Freq: Every day | ORAL | 3 refills | Status: DC
Start: 2021-09-13 — End: 2021-10-23

## 2021-09-13 NOTE — Nursing Note (Signed)
Performed POCT Blood Glucose test. Result: 90.     Milfred Krammes, RN  09/13/2021, 12:17

## 2021-09-13 NOTE — Nursing Note (Signed)
09/13/21 1200   Covid-19 Vaccine Questions   Which dose are you administering ? Booster   Do you have an active COVID infection ? N   Have you received COVID antibodies in the past 90 days ? N   Have you had a severe allergic reaction to vaccines in the past ( severe hives, swelling, shortness of breath, treatment with EpiPen or hospitalization? Karren Cobble, Ambulatory Care Assistant  09/13/2021, 12:20

## 2021-09-13 NOTE — Progress Notes (Signed)
INTERNAL MEDICINE - [facility]  New Patient Visit H&P     Name: Rachel [name] Date of service: 09/13/2021   MRN:  [id number]  Age/DOB: 80 y.o., [birth year] PCP:  [name], MD  Reason for Visit: Establish Care     Subjective:      Rachel [name] is a 80 y.o. 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  They are currently moving back to [place] from [facility].  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  She is retired and says up until a few years ago she used to enjoy a tennis gardening needle [employer] but now because of her chronic pain without osteoarthritis she has been on many unable to do any of these things.  She wakes up every few hours at night due to headaches and pain.  she was following with a pain management specialist in [place], who had tried injections into multiple joints for osteoarthritis and she actually underwent a shoulder replacement and anterior cervical spine fusion.  She had a opiate contract with this physician and had appointments every 3 months.  She has been on oxycodone for about 12 years every 6 hours.  She did try Tylenol in the past which was not effective and she tried Motrin briefly but she reports that "tore upper stomach".    Rachel [name] also experiences trouble with hypoglycemic episodes and syncope.  She reports that she has passed out while gardening and so her husband does not want her to do that without him present.  She has stopped driving because of this and she also notices changes in her vision and dizziness when these episodes occur.  She reports being worked up for this in [place] and was only found to have a low blood sugar.  She says she frequently forgets to eat meals.  Six days ago was her last fall on a  table she had her left rib around the level of her spleen.  She knows that she passed out 1st which led to the fall since then she has been having difficulty breathing because it is painful every time she takes a deep breath.  She reports that she gets very fatigued all of a sudden and then falls.  She says that she has had difficulty going to the grocery store because she relies on her husband to drive her there.  Her husband is a diabetic and so he eats low carb meals and so she has also been eating a similar diet although she knows she needs to eat more carbs.  Her weight has been stable around 105 lb but she does report being as low as 100 lb.  In the office she became dizzy during our visit and reports that she forgot to eat breakfast because she was fixing her husband cereal.  After drinking orange juice and Phillip Heal cracker she felt much better immediately. blood sugar was 90 after she had orange juice.  Her blood pressure was 138/72 with her heart rate being 75.    Rachel [name] tells me that she had cataract surgery in [place] and after that she could not see.  She went back to see her ophthalmologist and was told that she can see normally  and her vision appears normal on exam.  She is asking to establish with the [facility] here.  Because of this difficulty seeing she has been unable to drive for over a year.     Rachel [name] has history of migraines and was using Fioricet as needed which significantly helped her.  She was on propranolol previously but that made her fatigue worse and she felt that she was passing out more frequently.  She stopped the medication herself.  She is requesting a Fioricet refill.  She did use it 8 times in the last year for migraines.    Rachel [name] has a history of sinus infections she says she is currently on a prednisone taper from [place].  She had sinus surgery in the past which was not effective.  Fioricet also helps with her sinus headaches.    In for a marker  receive the flu vaccine and COVID booster but she has not had the by the limbus started the COVID booster was several months ago.       Medical History:      PAST MEDICAL  SURGICAL HISTORIES:   No past medical history on file.     SURGICAL HISTORIES:   Past Surgical History:   Procedure Laterality Date   . ANTERIOR FUSION CERVICAL SPINE  12/30/2020   . FOOT SURGERY Bilateral    . HX BREAST AUGMENTATION Bilateral     x3, 1974, 1994, 2014   . HX CATARACT REMOVAL Bilateral 01/12/2021   . HX ROTATOR CUFF REPAIR Right    . SACROILIAC JOINT INJECTION     . TOE AMPUTATION         ALLERGIES:  She is allergic to latex and shellfish derived.     FAMILY HISTORY:  Her family history is not on file.    SOCIAL HISTORY:  She  has no history on file for alcohol use.   She  reports that she has never smoked. She has never used smokeless tobacco.   She  has no history on file for drug use.    MEDICATIONS:  Outpatient Medications Marked as Taking for the 09/13/21 encounter (Office Visit) with [name], MD   Medication Sig   . Butalbital-Acetaminophen-Caff 50-300-40 mg Oral Capsule Take 1 Capsule by mouth Once per day as needed Indications: a migraine headache   . dexlansoprazole (DEXILANT ORAL) Take by mouth   . estrogen,con/m-progest acet (PREMPRO ORAL) Take by mouth   . fexofenadine HCl (ALLEGRA ORAL) Take by mouth   . guaiFENesin (MUCINEX) 600 mg Oral Tablet Extended Release 12hr Take 2 Tablets (1,200 mg total) by mouth Twice daily   . METHOCARBAMOL ORAL Take by mouth   . Oxycodone (ROXICODONE) 10 mg Oral Tablet Take 1 Tablet (10 mg total) by mouth Every 6 hours as needed for Pain for up to 10 days Indications: osteoarthritis   . turmeric root extract 500 mg Oral Tablet Take 1 Tablet (500 mg total) by mouth Once a day           Review Of Systems Rachel [name] is Positive):      Other than stated above, all other ROS were reviewed and are negative.     Physical Exam:     Vitals:  height is 1.608 m (5' 3.31") and weight is 47.4 kg  (104 lb 8 oz). Her temporal temperature is 36.4 C (97.5 F). Her blood pressure is 138/72 and her pulse is 98. Her oxygen saturation is 99%.  General:  Well-nourished, well-developed, in no apparent distress  Head:  Normocephalic, atraumatic  Ears:  Tympanic membranes pearly without erythema or edema bilaterally  Eyes:  Pupils equally round and reactive to light, extraocular muscles intact, sclareae non-icteric, conjunctivae clear  Nose:  Turbinates without inflammation or erythema  Throat:  Oropharynx clear, mucous membranes moist  Neck:  Trachea midline, no thyromegaly or lymphadenopathy  Cardiovascular:  Regular rate and rhythm, no murmurs, rubs, gallops  Respiratory:  Clear to auscultation bilaterally, no wheezes, rhonchi, crackles  Abdominal:  Bowel sounds normal; abdomen soft, non-tender to palpation  Extremities:  No edema, dorsalis pedis pulses 2+ bilaterally  Skin:  Warm and dry  Neurological:  Awake, A&O x 3. CNII-XII, strength and sensation grossly intact  Psychiatric:  Normal mood & affect, thought process linear, speech content normal         Data Reviewed and Interpretation:     HEMOGLOBIN A1C  No results found for: HA1C  COMPLETE BLOOD COUNT   No results found for: WBC, HGB, HCT, PLTCNT, BANDS    DIFFERENTIAL  No results found for: PMNS, LYMPHOCYTES, MYELOCYTES, MONOCYTES, EOSINOPHIL, BASOPHILS, NRBCS, PMNABS, LYMPHSABS, EOSABS, MONOSABS, BASOSABS  BILIRUBIN  No results found for: BILIRUBIN, TOTBILIRUBIN, NBILIP  BASIC METABOLIC PANEL  No results found for: SODIUM, POTASSIUM, CHLORIDE, CO2, ANIONGAP, BUN, CREATININE, BUNCRRATIO, GFR, CALCIUM, GLUCOSENF   COMPREHENSIVE METABOLIC PANEL FASTING  No results found for: SODIUM, POTASSIUM, CHLORIDE, CO2, ANIONGAP, BUN, CREATININE, GLUCOSEFAST, CALCIUM, PHOSPHORUS, ALBUMIN, TOTALPROTEIN, ALKPHOS, AST, ALT, BILIRUBINCON  GLUCOSE  No results found for: GLUCOSEFAST, GLUCOSENF  LIPID PROFILE  No results found for: CHOLESTEROL, HDLCHOL, LDLCHOL, LDLCHOLDIR,  TRIG           Assessment:        Rachel [name] is a 80 y.o. 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 Endoscopy Request (RUBY ONLY)   . CANCELED: XR CHEST AP AND LATERAL   . Covid-19 Vaccine,Moderna,17mcg/0.5ml,BIVALENT Booster Dose (18 yr+)   . CBC/DIFF   . Comp Metabolic Panel Nonfasting   . Lipid Panel   . VITAMIN D 25 TOTAL   . Vitamin B12   . Folate   . Hemoglobin A1C   . Refer to [facility] [employer]   . Refer to [place]   . Refer to [facility]   . Refer to [facility] [place]   . Refer to [name]   . POCT Blood glucose   . guaiFENesin (MUCINEX) 600 mg Oral Tablet Extended Release 12hr   . Oxycodone (ROXICODONE) 10 mg Oral Tablet   . turmeric root extract 500 mg Oral Tablet   . Butalbital-Acetaminophen-Caff 50-300-40 mg Oral Capsule            Plan:          (Z41.1) Encounter for breast augmentation  (primary encounter diagnosis)  Comment: plastic surgery consultation for implant removal ordered  Plan: Refer to [place]    (H54.7) Visual impairment  Comment: ophthalmology referral  s/p cataract surgery    Plan: Refer to [name] [place]    (R13.10) Dysphagia, unspecified type  Comment: endoscopy request to evaluate dysphagia after cervical spine surgery  Plan: Endoscopy Request (RUBY ONLY)    (J34.89) Thick nasal mucus  Plan: guaiFENesin (MUCINEX) 600 mg Oral Tablet         Extended Release 12hr    (E16.2) Hypoglycemia  Comment: frequent meals every 4-6 hours  BS in the office after orange juice was 90  Plan: Comp Metabolic Panel Nonfasting, Hemoglobin A1C    (E46) Malnutrition, unspecified type (CMS HCC)  Comment: instructed to eat every 4-6 hours when she takes her pain  medication for OA, appetite is normal but she forgets to eat   Plan: CBC/DIFF, VITAMIN D 25 TOTAL, Vitamin B12,         Folate    (Z00.00) Healthcare maintenance  Comment: bivalent COVID booster today  pneumovax at her pharmacy  derm referral for skin check  Plan: Lipid Panel, Covid-19         Vaccine,Moderna,20mcg/0.5ml,BIVALENT Booster         Dose (18 yr+), Refer to [name]    (M19.90) Osteoarthritis, unspecified osteoarthritis type, unspecified site  Comment: refilled oxycodone for 10 day supply but d/w patient that I do not feel comfortable with her being on opiates and would prefer a pain management referral  Plan: Oxycodone (ROXICODONE) 10 mg Oral Tablet,         turmeric root extract 500 mg Oral Tablet, Refer        to [facility]    (G43.909) Migraine  Comment: fioricet PRN, used it in [place] 8 times in the last year  Plan: Butalbital-Acetaminophen-Caff 50-300-40 mg Oral        Capsule    (R07.89) Left-sided chest wall pain  Comment: s/p fall - normal lung sounds, no ecchymoses or palpable rib abnormalities on exam  voltaren gel  Plan: CANCELED: XR CHEST AP AND LATERAL    (R55) Pre-syncope  Comment: 2/2 hypoglycemia - eat frequent meals  discussed options like meals on wheels and ordering grocery delivery  Plan: POCT Blood glucose    (I78.1) Spider veins  Comment: vein institute referral  Plan: Refer to [facility] [place] Maintenance:       Preventative Healthcare (Female):    Pap (>21yo q 3 years or q 5 years if negative HPV and >30yo)    HPV Vaccine N/a   Mammogram (76-74 yo, 40-50 if risk, > 75 if life expectancy > 10 yrs) No results found for this or any previous visit (from the past 17520 hour(s)).    Annually or bianually for implants   Colonoscopy (50-75 average risk or age 69 or 10 years prior to 1st degree relative diagnosis) 5 years ago colonoscopy normal. Was scheduled this month for EGD/colon for dysphagia but she is moving   CT  chest (>30 pack year smoking history; yearly starting at age 43-80, current smoker or quit within 15 years) N/a   Bone Density (>65 or with risk factors) No results found for this or any previous visit (from the past [id number] hour(s)).    Normal in last 5 years in [place]   Hemoglobin A1c (every 3 years if BMI >25 and additional risk factor - ADA)  Check today   Lipids No results found for: CHOLESTEROL, HDLCHOL, LDLCHOL, LDLCHOLDIR, TRIG   The ASCVD Risk score Mikey Bussing DC Jr., et al., 2013) failed to calculate for the following reasons:    Cannot find a previous HDL lab    Cannot find a previous total cholesterol  lab    Unable to determine if patient is Non-Hispanic African American    Check today   Depression Screening with PHQ-2 (yearly) completed   HIV screening (one time) declined   HCV screening (one time) declined   Tetanus Booster (every 10 years, 5 years if wound) Due next year   Flu Shot (yearly) [place] in [place]   Pneumonia Vaccine (>65yo, or CAD/DM/COPD/liver disease, or smoking every 5 years or once after 65) Will get pneumovax at pharmacy   Zoster vaccination (>50yo) completed     Follow Up:       Return to clinic in 6 months.    Patient had no questions regarding treatment plan, goals, risks or benefits and agrees to contact me or my clinic in the interim should any questions or problems arise.    On the day of the encounter, a total of  90 minutes was spent on this patient encounter including review of historical information, examination, documentation and post-visit activities.     [name], MD 09/13/2021, 12:06  Department of Internal Medicine  [facility]

## 2021-09-14 LAB — VITAMIN D 25 TOTAL: VITAMIN D, 25OH: 58 ng/mL (ref 30–100)

## 2021-09-14 LAB — HGA1C (HEMOGLOBIN A1C WITH EST AVG GLUCOSE)
ESTIMATED AVERAGE GLUCOSE: 128 mg/dL
HEMOGLOBIN A1C: 6.1 % — ABNORMAL HIGH (ref 4.0–5.6)

## 2021-09-15 ENCOUNTER — Other Ambulatory Visit (HOSPITAL_BASED_OUTPATIENT_CLINIC_OR_DEPARTMENT_OTHER): Payer: Self-pay | Admitting: Internal Medicine

## 2021-09-15 DIAGNOSIS — R0602 Shortness of breath: Secondary | ICD-10-CM

## 2021-09-15 DIAGNOSIS — W19XXXA Unspecified fall, initial encounter: Secondary | ICD-10-CM

## 2021-09-18 ENCOUNTER — Other Ambulatory Visit: Payer: Self-pay

## 2021-09-18 ENCOUNTER — Ambulatory Visit: Payer: Medicare Other | Attending: Hand Surgery | Admitting: Hand Surgery

## 2021-09-18 ENCOUNTER — Encounter (INDEPENDENT_AMBULATORY_CARE_PROVIDER_SITE_OTHER): Payer: Self-pay | Admitting: Hand Surgery

## 2021-09-18 DIAGNOSIS — Z45812 Encounter for adjustment or removal of left breast implant: Secondary | ICD-10-CM

## 2021-09-18 DIAGNOSIS — N644 Mastodynia: Secondary | ICD-10-CM

## 2021-09-18 DIAGNOSIS — Z45811 Encounter for adjustment or removal of right breast implant: Secondary | ICD-10-CM

## 2021-09-18 DIAGNOSIS — Z411 Encounter for cosmetic surgery: Secondary | ICD-10-CM | POA: Insufficient documentation

## 2021-09-18 NOTE — Progress Notes (Signed)
PHYSICIAN OFFICE [facility]  PLASTIC SURGERY, [place] [address]  [phone]  COSMETIC BREAST CONSULT    Chief Complaint  Painful breast implants    History of Present Illness  Rachel [name] is a 80 y.o. female who presents today with concerns about the appearance of her breasts. Specifically, she is concerned about the pain associated with her implants.  Her current implants were placed in 1994, and she previously had a first pair of implants placed in 1974.  The current implants are saline implants, presumably over the muscle.  [place] A Cibrian does not have a personal history of breast pathology.    Past Medical History  Past Medical History:   Diagnosis Date   . Depression    . GERD (gastroesophageal reflux disease)    . Osteoarthritis        Past Surgical History  Past Surgical History:   Procedure Laterality Date   . ANTERIOR FUSION CERVICAL SPINE  12/30/2020   . FOOT SURGERY Bilateral    . HX BREAST AUGMENTATION Bilateral     x3, 1974, 1994, 2014   . HX CATARACT REMOVAL Bilateral 01/12/2021   . HX ROTATOR CUFF REPAIR Right    . SACROILIAC JOINT INJECTION     . TOE AMPUTATION             Medications  Current Outpatient Medications   Medication Sig Dispense Refill   . Butalbital-Acetaminophen-Caff 50-300-40 mg Oral Capsule Take 1 Capsule by mouth Once per day as needed Indications: a migraine headache 30 Capsule 0   . dexlansoprazole (DEXILANT ORAL) Take by mouth     . DULoxetine (CYMBALTA DR) 60 mg Oral Capsule, Delayed Release(E.C.) Take 60 mg by mouth Once a day     . estrogen,con/m-progest acet (PREMPRO ORAL) Take by mouth     . fexofenadine HCl (ALLEGRA ORAL) Take by mouth     . guaiFENesin (MUCINEX) 600 mg Oral Tablet Extended Release 12hr Take 2 Tablets (1,200 mg total) by mouth Twice daily     . METHOCARBAMOL ORAL Take by mouth     . Oxycodone (ROXICODONE) 10 mg Oral Tablet Take 1 Tablet (10 mg total) by mouth Every 6 hours as needed for Pain  for up to 10 days Indications: osteoarthritis 40 Tablet 0   . turmeric root extract 500 mg Oral Tablet Take 1 Tablet (500 mg total) by mouth Once a day 90 Tablet 3     No current facility-administered medications for this visit.       Allergies  Allergies   Allergen Reactions   . Latex    . Shellfish Derived        Family History  Family Medical History:    None           Social History  Social History     Socioeconomic History   . Marital status: Married     Spouse name: Not on file   . Number of children: Not on file   . Years of education: Not on file   . Highest education level: Not on file   Occupational History   . Not on file   Tobacco Use   . Smoking status: Never Smoker   . Smokeless tobacco: Never Used   Substance and Sexual Activity   . Alcohol use: Not on file   . Drug use: Not on file   . Sexual activity: Not on file   Other Topics  Concern   . Not on file   Social History Narrative   . Not on file     Social Determinants of Health     Financial Resource Strain: Not on file   Food Insecurity: Not on file   Transportation Needs: Not on file   Physical Activity: Not on file   Stress: Not on file   Intimate Partner Violence: Not on file   Housing Stability: Not on file       Review of Systems  negative except for HPI    Physical Exam  BP (!) 140/62   Pulse 95   Temp 36.4 C (97.5 F)   Ht 1.626 m (5\' 4" )   Wt 48.4 kg (106 lb 11.2 oz)   SpO2 100%   BMI 18.32 kg/m       Body mass index is 18.32 kg/m.    Physical Exam  Constitutional:       General: She is not in acute distress.  HENT:      Head: Normocephalic and atraumatic.      Nose: Nose normal. No congestion.   Eyes:      General: No scleral icterus.     Extraocular Movements: Extraocular movements intact.   Cardiovascular:      Rate and Rhythm: Normal rate.   Pulmonary:      Effort: Pulmonary effort is normal. No respiratory distress.   Abdominal:      Palpations: Abdomen is soft.   Musculoskeletal:         General: No deformity. Normal range of  motion.      Cervical back: Normal range of motion and neck supple.   Skin:     General: Skin is warm and dry.      Capillary Refill: Capillary refill takes less than 2 seconds.   Neurological:      General: No focal deficit present.      Mental Status: She is alert and oriented to person, place, and time.   Psychiatric:         Mood and Affect: Mood normal.         Behavior: Behavior normal.       Breasts: No obvious masses bilaterally, bilateral grade 3 ptosis, implants sitting low on chest wall   RIGHT:  No palpable masses, abnormal skin color or changes, nipple discharge or inversion; mildly-TTP, no animation, no discharge or bleeding from nipple; no axillary lymphadenopathy   LEFT:  No palpable masses, abnormal skin color or changes, nipple discharge or inversion; mildly-TTP, no animation, no discharge or bleeding from nipple; no axillary lymphadenopathy        Assessment and Plan  [name] is a 80 y.o. female with nearly 79 year old implants who now has pain associated with the implants. She would benefit from implant removal.  We discussed removing the implants, possibly with a partial capsulectomy.  She is amenable to this plan    Consent obtained  Photographs obtained  Will submit to insurance for authorization  Will schedule surgery  Pt should have PAT    Alysia Penna, MD 09/18/2021, 16:01  PGY-5  [facility]  Plastic, Reconstructive and Hand Surgery  Pager [phone]      I saw and examined the patient.  I reviewed the resident's note.  I agree with the findings and plan of care as documented in the resident's note.  Any exceptions/additions are edited/noted.    Redgie Grayer, MD

## 2021-09-29 ENCOUNTER — Encounter (INDEPENDENT_AMBULATORY_CARE_PROVIDER_SITE_OTHER): Payer: Self-pay | Admitting: NURSE PRACTITIONER-ADULT HEALTH

## 2021-09-29 ENCOUNTER — Ambulatory Visit: Payer: Medicare Other | Attending: NURSE PRACTITIONER-ADULT HEALTH | Admitting: NURSE PRACTITIONER-ADULT HEALTH

## 2021-09-29 ENCOUNTER — Other Ambulatory Visit: Payer: Self-pay

## 2021-09-29 VITALS — BP 120/62 | HR 92 | Temp 97.5°F | Resp 18 | Ht 64.0 in | Wt 104.7 lb

## 2021-09-29 DIAGNOSIS — M5412 Radiculopathy, cervical region: Secondary | ICD-10-CM | POA: Insufficient documentation

## 2021-09-29 DIAGNOSIS — M542 Cervicalgia: Secondary | ICD-10-CM | POA: Insufficient documentation

## 2021-09-29 DIAGNOSIS — G8929 Other chronic pain: Secondary | ICD-10-CM | POA: Insufficient documentation

## 2021-09-29 DIAGNOSIS — M5416 Radiculopathy, lumbar region: Secondary | ICD-10-CM | POA: Insufficient documentation

## 2021-09-29 DIAGNOSIS — M5136 Other intervertebral disc degeneration, lumbar region: Secondary | ICD-10-CM | POA: Insufficient documentation

## 2021-09-29 DIAGNOSIS — M25511 Pain in right shoulder: Secondary | ICD-10-CM | POA: Insufficient documentation

## 2021-09-29 DIAGNOSIS — M79643 Pain in unspecified hand: Secondary | ICD-10-CM | POA: Insufficient documentation

## 2021-09-29 DIAGNOSIS — M47816 Spondylosis without myelopathy or radiculopathy, lumbar region: Secondary | ICD-10-CM | POA: Insufficient documentation

## 2021-09-29 DIAGNOSIS — M1990 Unspecified osteoarthritis, unspecified site: Secondary | ICD-10-CM | POA: Insufficient documentation

## 2021-09-29 DIAGNOSIS — M47812 Spondylosis without myelopathy or radiculopathy, cervical region: Secondary | ICD-10-CM | POA: Insufficient documentation

## 2021-09-29 DIAGNOSIS — M545 Low back pain, unspecified: Secondary | ICD-10-CM | POA: Insufficient documentation

## 2021-09-29 DIAGNOSIS — G43109 Migraine with aura, not intractable, without status migrainosus: Secondary | ICD-10-CM | POA: Insufficient documentation

## 2021-09-29 NOTE — H&P (Addendum)
[facility]  [address] [place], [place] [address]  [phone]    History and Physical      Rachel [name]  MRN: [id number]  DOB: 09-23-1941  Date of Service: 09/29/2021    CHIEF COMPLAINT  Chief Complaint   Patient presents with   . Osteoarthritis Multiple Sites     Everywhere the worst neck and hands       HISTORY OF PRESENT ILLNESS  Rachel [name] is a 80 y.o. female presenting to clinic to establish care. Patient recently moved here from [place], [place] and is taking care of her husband. The patient reports recent neck surgery in January for C3-C5 fusion with plates. She had a follow up with her neurosurgeon in [place] at 6 months and was not due to be seen again. Patient endorses neck and shoulder pain that radiates down her arms and into her fingers. This pain began 10 years ago when she fell on her shoulder. Patient states that she had surgery to repair it however they missed an additional tear and it has been painful since. After this incident is when she was prescribed oxycodone 10mg  q6h for her pain in addition to low back and hand pain. She describes the shoulder pain as stabbing and aching. The pain is intermittent with aggravating factors include using her right arm. Alleviating factors include rest and ice with alternating heat. She has done physical therapy for her shoulder off and on for 6 years. 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 Patient states that they occur everyday and she usually wakes up with them. Patient also endorses aura with pain presenting behind her eyes before the migraine appears. She is currently on propranolol and Fioricet and she said they work decent but could be better. In addition patient has endorsed dizzy spells that she has attributed to forgetting to eat meals and hypoglycemia. States PCP is working it up. Patient complains of chronic hand pain that started 1.5 years ago. Patient  describes the pain as stabbing and aching. The pain is intermittent and is aggravated by use with daily activities. Alleviating factors include rest and massage. Patient also endorses bilateral low back pain that has been present for 10+ years. She describes the pain as an intense ache that radiates down her buttocks and into the sides of her knees. Aggravating factors include changing positions and walking. Alleviating factors include laying down and ice. Of note patient obtained medical marijuana card from pain management doctor in [place] but no longer has the card and is expired. Patient used topical marijuana gel on joints.     Most recent PT completed: shoulder for 6 years     Previous procedures: TPIs in hands and wrist, shoulder, neck and low back and coccyx - would last over 6 months    Previous medications tried:  Tylenol: arthritis  NSAIDS: Motrin   Anticonvulsants (gabapentin, pregabalin): gabapentin   SNRIs (duloxetine, venlafaxine, milnacipran): currently on Cymbalta   TCAs (desipramine, nortriptyline, imipramine, doxepin, amitriptyline) : None  Atypical antidepressants (buproprion): None  Muscle relaxants (cyclobenzaprine, methocarbamol, baclofen, tizanidine, carisoprodol, metaxolone): currently on robaxin    Topicals (lidocaine, voltaren, capsaicin, compound cream): using voltaren    Other (ketamine, zinconatide): None  Opioids: oxycodone 10 years for back pain   Sleep aids (melatonin, trazadone, ambien, benzodiazepines): none      PAST MEDICAL HISTORY  Past Medical History:   Diagnosis Date   . Depression    .  GERD (gastroesophageal reflux disease)    . Low back pain    . Neck problem    . Osteoarthritis    . Osteoarthritis            MEDICATIONS  Butalbital-Acetaminophen-Caff 50-300-40 mg Oral Capsule, Take 1 Capsule by mouth Once per day as needed Indications: a migraine headache  dexlansoprazole (DEXILANT ORAL), Take by mouth  DULoxetine (CYMBALTA DR) 60 mg Oral Capsule, Delayed Release(E.C.),  Take 60 mg by mouth Once a day  estrogen,con/m-progest acet (PREMPRO ORAL), Take by mouth  fexofenadine HCl (ALLEGRA ORAL), Take by mouth  guaiFENesin (MUCINEX) 600 mg Oral Tablet Extended Release 12hr, Take 2 Tablets (1,200 mg total) by mouth Twice daily  METHOCARBAMOL ORAL, Take by mouth  rOPINIRole (REQUIP) 2 mg Oral Tablet, Take 2 mg by mouth Twice daily  turmeric root extract 500 mg Oral Tablet, Take 1 Tablet (500 mg total) by mouth Once a day    No facility-administered medications prior to visit.      ALLERGIES  Allergies   Allergen Reactions   . Latex    . Shellfish Derived        PAST SURGICAL HISTORY  Past Surgical History:   Procedure Laterality Date   . ANTERIOR FUSION CERVICAL SPINE  12/30/2020   . FOOT SURGERY Bilateral    . HX BREAST AUGMENTATION Bilateral     x3, 1974, 1994, 2014   . HX CATARACT REMOVAL Bilateral 01/12/2021   . HX ROTATOR CUFF REPAIR Right    . SACROILIAC JOINT INJECTION     . TOE AMPUTATION             IMMUNIZATIONS  Immunization History   Administered Date(s) Administered   . Covid-19 Vaccine,Moderna Bivalent Booster,61yrs+ 09/13/2021   . Covid-19 Vaccine,Moderna,12 Years+ 02/14/2020, 03/12/2020, 11/14/2020       FAMILY HISTORY  Family Medical History:     Problem Relation (Age of Onset)    No Known Problems Mother, Father            SOCIAL HISTORY  Social History     Socioeconomic History   . Marital status: Married   Tobacco Use   . Smoking status: Never Smoker   . Smokeless tobacco: Never Used   Vaping Use   . Vaping Use: Never used   Substance and Sexual Activity   . Alcohol use: Not Currently   . Drug use: Yes     Frequency: 5.0 times per week     Types: Marijuana     Comment: medical marijuana gel       REVIEW OF SYSTEMS  Positive ROS discussed in HPI, otherwise all other systems negative.      PHYSICAL EXAM  Vitals: Blood pressure 120/62, pulse 92, temperature 36.4 C (97.5 F), resp. rate 18, height 1.626 m (5\' 4" ), weight 47.5 kg (104 lb 11.5 oz), SpO2 99 %. Body mass  index is 17.97 kg/m.  General: appears stated age, no acute distress  HEENT: conjunctiva clear; pupils equal and round  Abdomen: soft, non-tender  Skin: no rashes, healing wound on right anterior shin of lower extremity    Neurologic: gait normal, motor and sensory intact bilaterally, CN 2-12 grossly intact, AOx3, DTR intact and 2+  Vascular: radial and pedal pulses palpable and symmetric  Psychiatric: normal affect and behavior  Musculoskeletal:  Cervical   ROM: Limited extension and Limited rotation   Palpation:  Non-tender   Motor: 5/5 in UEs b/l   Sensory: Intact in UEs b/l  Facet loading: Negative bilaterally   Facet TTP: Negative bilaterally   Trigger points: Negative     Lumbar   ROM: Preserved   Palpation: Tender   Motor: 5/5 in LEs b/l   Sensory: Intact in LEs b/l   Facet loading: Positive bilaterally   Facet TTP: Positive bilaterally   Trigger points:  Lumbar paraspinal bilateral   Straight leg raise:  Negative bilaterally     SI Joint   Palpation: Tender bilaterally   Provocation Test :         Gaenslen's:  Negative bilaterally        FABER: Negative bilaterally        Distraction: Positive        Compression: Negative       ASSESSMENT  (M19.90) Osteoarthritis, unspecified osteoarthritis type, unspecified site    Reviewed CIPM rules and regulations of not opioid medications on the first visit. Also educated patient on process of the clinic prescribing opioids of which includes behavioral med evaluation. Due to patients vertigo spells with an episode of syncope with unknown origin, it is not appropriate to prescribe opioids that could exacerbate her symptoms at this time. Will obtain urine drug screen today and send for behavioral med evaluation but will not prescribe opioids until origin of vertigo is resolved. Continue to follow with PCP and educated patient that since we cannot prescribe opioids at this time that her PCP would need to prescribe them for her. In addition educated patient that she  cannot be on medical marijuana on opioids and patient agreed to stop medical marijuana.     Since patients pain is consistent with facet arthritis and age related degenerative changes will recommend future TPIs due to achieved relief. Patient is having breast implant removal surgery in 2 weeks and educated patient on the importance of avoiding steroid injections before the surgery that could impact her recovery. Patient is aware that she will call and schedule after she has fully recovered.       PLAN/RECOMMENDATION:    Orders Placed This Encounter   . Referral to Neurology   . Referral to [facility]     1. Refer to headache clinic with neurology   2. Urine drug screen today for opioid eval   3. Call and schedule TPIs after breast implant removal surgery   4. Stop medical marijuana use, if wanting to be candidate for CIPM opioid management   5. Continue to follow with PCP and follow up with referrals placed    Bennie Hind, PA-C 09/29/2021, 16:47     I saw patient with PA as above- agree with findings as documented with exception that we will request records from [facility] to include recent MRI cervical and lumbar spine. Patient reports all pain complaints are chronic in nature and is not experiencing new acute symptoms. Reviewed red flag signs and when to report to ER for evaluation. Discussed that cannot prescribe opioids with medical marijuana products. Consider low dose opioids after medical optimization and B-Med evaluation.    Vergie Living, NP  09/29/2021, 16:59

## 2021-09-29 NOTE — Nursing Note (Signed)
New Patient Visit    [name]    Chief Complaint   Patient presents with   . Osteoarthritis Multiple Sites     Everywhere the worst neck and hands         [place] Pain Rating Scale     On a scale of 0-10, during the past 24 hours, pain has interfered with you usual activity: 8     On a scale of 0-10, during the past 24 hours, pain has interfered with your sleep: 8    On a scale of 0-10, during the past 24 hours, pain has affected your mood: 5     On a scale of 0-10, during the past 24 hours, pain has contributed to your stress: 10     On a scale of 0-10, what is your overall pain Rating: 3        Vitals:    09/29/21 1422   BP: 120/62   Pulse: 92   Resp: 18   Temp: 36.4 C (97.5 F)   SpO2: 99%   Weight: 47.5 kg (104 lb 11.5 oz)   Height: 1.626 m (5\' 4" )   PainSc:   5   PainLoc: Neck       Body mass index is 17.97 kg/m.    If taking opioid medication, current prescribing provider: Yes Oxycodone from PCP    Recent imaging:  1. No results found for this or any previous visit (from the past [id number] hour(s)).   2. No results found for this or any previous visit (from the past [id number] hour(s)).   3. No results found for this or any previous visit (from the past 158309407 hour(s)).   4. No results found for this or any previous visit (from the past [id number] hour(s)).  [facility]      Prior Pain Management:  PT: yes  Heat: yes  Ice: yes  Chiropractor: no   Bedrest: yes  Injection: yes  VAS (0-10) Now: 5  VAS (0-10) Best: 5  VAS (0-10) Worst: 10    Appliances Needed:       Other Questions:  Recurring Fevers: no  Numbness/tingling: yes (both in fingertips)  Trouble sleeping: yes  Poor/increased appetite: yes (poor)  Weight loss/Gain: yes (loss)  Muscle Spasm: yes (both legs)  Cold/burning sensation: no  Skin discolors/where: no  Bowel/bladder issues: no  Urinary urgency-frequency: yes (frequently)    Ladell Pier, Ambulatory Care Assistant  09/29/2021, 14:25

## 2021-10-02 ENCOUNTER — Ambulatory Visit (INDEPENDENT_AMBULATORY_CARE_PROVIDER_SITE_OTHER): Payer: Medicare Other

## 2021-10-02 ENCOUNTER — Encounter (INDEPENDENT_AMBULATORY_CARE_PROVIDER_SITE_OTHER): Payer: Self-pay | Admitting: Student in an Organized Health Care Education/Training Program

## 2021-10-02 ENCOUNTER — Ambulatory Visit
Payer: Medicare Other | Attending: Internal Medicine | Admitting: Student in an Organized Health Care Education/Training Program

## 2021-10-02 ENCOUNTER — Other Ambulatory Visit: Payer: Self-pay

## 2021-10-02 DIAGNOSIS — Z961 Presence of intraocular lens: Secondary | ICD-10-CM

## 2021-10-02 DIAGNOSIS — H547 Unspecified visual loss: Secondary | ICD-10-CM

## 2021-10-02 DIAGNOSIS — H35363 Drusen (degenerative) of macula, bilateral: Secondary | ICD-10-CM | POA: Insufficient documentation

## 2021-10-02 DIAGNOSIS — H43811 Vitreous degeneration, right eye: Secondary | ICD-10-CM | POA: Insufficient documentation

## 2021-10-02 DIAGNOSIS — H04123 Dry eye syndrome of bilateral lacrimal glands: Secondary | ICD-10-CM

## 2021-10-02 NOTE — Progress Notes (Addendum)
[name]  [place] [address]  Operated by [facility]         Patient Name: Rachel [name]  MRN#: [id number]  Birthdate: 12-22-1941    Date of Service: 10/02/2021        Rachel [name] is a 80 y.o. female who presents today for evaluation/consultation of:  HPI    Pt here for NPV eval   Referred by PCP    Has had cataracts removed OU within the past year. States she has had new glasses made since then, but is still not seeing well with them.   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     Denies eye pain, irritation.   Denies HA, nausea, dizziness.   Denies other changes or concerns.   Last edited by Rachel [name], [place] on 10/02/2021  9:30 AM.        ROS    Positive for: Eyes  Last edited by Rachel [name], [place] on 10/02/2021  9:30 AM.         All other systems Negative    Rachel [name], Rachel [name]  10/02/2021, 09:30    Base Eye Exam     Visual Acuity (Snellen - Linear)       Right Left    Dist cc 20/20 -2 20/25    Dist ph cc  NI    Near cc 20/20 20/20    Correction: Glasses          Tonometry (Tonopen, 9:44 AM)       Right Left    Pressure 16 17          Pupils       Pupils APD    Right PERRL None    Left PERRL None          Visual Fields       Right Left     Full Full          Extraocular Movement       Right Left     Full Full          Neuro/Psych     Oriented x3: Yes    Mood/Affect: Normal          Dilation     Both eyes: 1.0% Mydriacyl, 2.5% Phenylephrine @ 9:44 AM            Refraction     Wearing Rx       Sphere Cylinder Axis Add    Right [place] +0.50 120 +2.75    Left -0.25 +1.25 175 +2.75    Age: <55yr    Type: PAL   Pt states that she has prisms in [name] glasses, but confirmed that there are no prism in the lenses. Pt may be confused (? Speaking of bifocal- does not know the difference)                  MD Addition to HPI: Pt here for eye exam. Had new pair of glasses made which pt feels that it isn't too great. Had [name] within a year and  unsure which doctor did the surgery. No eye disease other than wearing glasses. No diplopia. No specific fhx otherwise. No other visual concerns. VA has been stable since [place] and wasn't happy since [name].           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 Visual impairment  H54.7     No orders of the defined types were placed in this encounter.      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 I discussed the above diagnoses listed in the assessment and the above ophthalmic plan of care with the patient and patient's family. All questions were answered. I reviewed and, when necessary, made changes to the technician/resident note, documented ophthalmology exam, chief complaint, history of present illness, allergies, review of systems, past medical, past surgical, family and social history. I personally reviewed and interpreted all testing and/or imaging performed at this visit and agree with the resident's or fellow's interpretation. Any exceptions/additions are edited/noted in the relevant encounter fields.          I saw and examined the patient.  I reviewed the resident's or fellow's note.  I agree with the findings and plan of care as documented in the resident's or fellow's note.  I personally reviewed and interpreted all testing and/or imaging performed at this visit and agree with the resident's or fellow's interpretation. Any exceptions/additions are edited/noted.     Rachel Her, MD 10/02/2021, 10:28

## 2021-10-03 ENCOUNTER — Encounter (HOSPITAL_BASED_OUTPATIENT_CLINIC_OR_DEPARTMENT_OTHER): Payer: Self-pay | Admitting: Family

## 2021-10-03 ENCOUNTER — Ambulatory Visit: Payer: Medicare Other | Attending: Family | Admitting: Family

## 2021-10-03 ENCOUNTER — Other Ambulatory Visit (HOSPITAL_BASED_OUTPATIENT_CLINIC_OR_DEPARTMENT_OTHER): Payer: Self-pay | Admitting: Internal Medicine

## 2021-10-03 ENCOUNTER — Other Ambulatory Visit (HOSPITAL_BASED_OUTPATIENT_CLINIC_OR_DEPARTMENT_OTHER): Payer: Self-pay | Admitting: Family

## 2021-10-03 VITALS — BP 128/68 | HR 76 | Temp 97.7°F | Ht 63.7 in | Wt 104.7 lb

## 2021-10-03 DIAGNOSIS — F39 Unspecified mood [affective] disorder: Secondary | ICD-10-CM

## 2021-10-03 DIAGNOSIS — Z0000 Encounter for general adult medical examination without abnormal findings: Secondary | ICD-10-CM | POA: Insufficient documentation

## 2021-10-03 DIAGNOSIS — T148XXA Other injury of unspecified body region, initial encounter: Secondary | ICD-10-CM

## 2021-10-03 DIAGNOSIS — G2581 Restless legs syndrome: Secondary | ICD-10-CM

## 2021-10-03 DIAGNOSIS — M1990 Unspecified osteoarthritis, unspecified site: Secondary | ICD-10-CM

## 2021-10-03 DIAGNOSIS — R0602 Shortness of breath: Secondary | ICD-10-CM

## 2021-10-03 DIAGNOSIS — Z23 Encounter for immunization: Secondary | ICD-10-CM | POA: Insufficient documentation

## 2021-10-03 MED ORDER — BUSPIRONE 5 MG TABLET
5.0000 mg | ORAL_TABLET | Freq: Three times a day (TID) | ORAL | 0 refills | Status: DC
Start: 2021-10-03 — End: 2021-12-27

## 2021-10-03 MED ORDER — ROPINIROLE 4 MG TABLET
4.0000 mg | ORAL_TABLET | Freq: Two times a day (BID) | ORAL | 0 refills | Status: DC
Start: 2021-10-03 — End: 2021-11-13

## 2021-10-03 MED ORDER — DOXYCYCLINE HYCLATE 100 MG TABLET
100.0000 mg | ORAL_TABLET | Freq: Two times a day (BID) | ORAL | 0 refills | Status: DC
Start: 2021-10-03 — End: 2021-10-23

## 2021-10-03 MED ORDER — PROPRANOLOL ER 60 MG CAPSULE,24 HR,EXTENDED RELEASE
60.0000 mg | ORAL_CAPSULE | Freq: Every day | ORAL | 0 refills | Status: DC
Start: 2021-10-03 — End: 2021-11-08

## 2021-10-03 NOTE — Nursing Note (Signed)
Immunization administered     Name Date Dose VIS Date Route    Pneumovax 10/03/2021 0.5 mL 10/22/2018 Intramuscular    Site: Right deltoid    Given By: Ezequiel Essex, LPN    Manufacturer: [employer]    Lot: [id number]    [place]: [phone]    Shingrix - Zoster Vaccine (Admin) 10/03/2021 0.5 mL 01/27/2021 Intramuscular    Site: Left deltoid    Given By: Leodis Sias Antionette, LPN    Manufacturer: [employer]    Lot: [id number]    [place]: [phone]

## 2021-10-03 NOTE — Progress Notes (Signed)
INTERNAL MEDICINE   [facility]  Return Visit Progress Note     Name: Rachel [name] Date of Service: 10/03/2021   MRN:  [id number]  Age/DOB: 80 y.o., 1941-07-19 PCP:  [name], MD  Reason for Visit: Follow Up     Subjective:      Rachel [name] is a 80 y.o. 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 Patient was last seen in clinic on 09/13/21.     Light headed/dizziness and shortness of breath. She states this has been going on for some time now. Initially thinks it was related to her sinus infection. She thought his may be related to HTN, but it has not been elevated. She had an eye exam yesterday with improved vision. She is able to lay flat without difficulty breathing. Exertion tends to make her feel more short of breath. If she sits down to rest, shortness of breath improves. She states she is very stressed related to move and her husband.     She is not sleeping well, only able to sleep a few hours at a time. Feels that she has disturbed sleeping pattern. Her legs are continuing to bother her at night. She feels that this is causing her a lot of problems falling asleep as well. She is agreeable to increasing Requip Dose.     She had stopped taking her propranolol to help decrease her daily medications. Her headaches have intensified since that time. We discussed restarting propranolol and allegra daily.     She is out of her oxycodone. She had a short prescription written by PCP, waiting for pain clinic to accept her as a patient. She states she has a lot of back pain. She has been using tylenol to help between doses of oxycodone. She is currently using oxycodone 10 mg QID. She is requesting refill until she can get set up officially with pain management. Will have refill pended to PCP.      Medical History:      Past Medical History, Allergies, Surgical History, Family, and  Social History were reviewed  on   10/03/2021 and updated as needed.     MEDICATIONS:  Outpatient Medications Marked as Taking for the 10/03/21 encounter (Office Visit) with Gwendolyn Lima, APRN,FNP-BC   Medication Sig   . busPIRone (BUSPAR) 5 mg Oral Tablet Take 1 Tablet (5 mg total) by mouth in the morning and 1 Tablet (5 mg total) at noon and 1 Tablet (5 mg total) before bedtime. Do all this for 30 days.   . Butalbital-Acetaminophen-Caff 50-300-40 mg Oral Capsule Take 1 Capsule by mouth Once per day as needed Indications: a migraine headache   . carboxymethylcellulose sodium (ARTIFICIAL TEARS, CMC, OPHT) Administer into affected eye(s)   . dexlansoprazole (DEXILANT ORAL) Take by mouth   . doxycycline 100 mg Oral Tablet Take 1 Tablet (100 mg total) by mouth in the morning and 1 Tablet (100 mg total) before bedtime. Do all this for 7 days.   . DULoxetine (CYMBALTA DR) 60 mg Oral Capsule, Delayed Release(E.C.) Take 60 mg by mouth Once a day   . estrogen,con/m-progest acet (PREMPRO ORAL) Take by mouth   . METHOCARBAMOL ORAL Take 500 mg by mouth Once a day   . mometasone furoate (NASONEX NASL) Administer into affected nostril(s)   . propranoloL (INDERAL LA) 60 mg Oral Capsule,Sustained Action 24 hr Take  1 Capsule (60 mg total) by mouth Once a day for 30 days   . rOPINIRole (REQUIP) 4 mg Oral Tablet Take 1 Tablet (4 mg total) by mouth in the morning and 1 Tablet (4 mg total) before bedtime. Do all this for 30 days.     Physical Exam:     Vitals:   height is 1.618 m (5' 3.7") and weight is 47.5 kg (104 lb 11.5 oz). Her temporal temperature is 36.5 C (97.7 F). Her blood pressure is 128/68 and her pulse is 76.      Physical Exam  Constitutional:       General: She is not in acute distress.     Appearance: She is well-groomed. She is cachectic. She is not ill-appearing.   HENT:      Head: Normocephalic and atraumatic.      Right Ear: Tympanic membrane normal.      Left Ear: Tympanic membrane normal.      Mouth/Throat:       Mouth: Mucous membranes are moist.      Pharynx: Oropharynx is clear. No posterior oropharyngeal erythema.   Eyes:      Extraocular Movements: Extraocular movements intact.      Conjunctiva/sclera: Conjunctivae normal.      Pupils: Pupils are equal, round, and reactive to light.   Cardiovascular:      Rate and Rhythm: Normal rate and regular rhythm.      Pulses: Normal pulses.      Heart sounds: Normal heart sounds.   Pulmonary:      Effort: Pulmonary effort is normal.      Breath sounds: Normal breath sounds.   Abdominal:      General: Bowel sounds are normal.      Palpations: Abdomen is soft.      Tenderness: There is no abdominal tenderness.   Musculoskeletal:      Cervical back: Neck supple. No tenderness.   Lymphadenopathy:      Cervical: No cervical adenopathy.   Skin:     General: Skin is warm and dry.      Capillary Refill: Capillary refill takes less than 2 seconds.      Findings: Abrasion, erythema and signs of injury present.          Neurological:      General: No focal deficit present.      Mental Status: She is alert and oriented to person, place, and time.   Psychiatric:         Mood and Affect: Mood normal.         Behavior: Behavior normal.          Data Reviewed and Interpretation:     CBC  Diff   Lab Results   Component Value Date/Time    WBC 8.8 09/13/2021 12:41 PM    HGB 11.6 09/13/2021 12:41 PM    HCT 36.5 09/13/2021 12:41 PM    PLTCNT 182 09/13/2021 12:41 PM    RBC 3.95 09/13/2021 12:41 PM    MCV 92.4 09/13/2021 12:41 PM    MCHC 31.8 09/13/2021 12:41 PM    MCH 29.4 09/13/2021 12:41 PM    MPV 9.3 09/13/2021 12:41 PM    Lab Results   Component Value Date/Time    PMNS 86 09/13/2021 12:41 PM    MONOCYTES 4 09/13/2021 12:41 PM    BASOPHILS 1 09/13/2021 12:41 PM    BASOPHILS <0.10 09/13/2021 12:41 PM    PMNABS 7.55 09/13/2021 12:41 PM  LYMPHSABS 0.72 (L) 09/13/2021 12:41 PM    EOSABS 0.10 09/13/2021 12:41 PM    MONOSABS 0.35 09/13/2021 12:41 PM           BASIC METABOLIC PANEL  Lab Results    Component Value Date    SODIUM 132 (L) 09/13/2021    POTASSIUM 4.3 09/13/2021    CHLORIDE 97 09/13/2021    CO2 27 09/13/2021    ANIONGAP 8 09/13/2021    BUN 21 09/13/2021    CREATININE 0.95 09/13/2021    BUNCRRATIO 22 09/13/2021    GFR 61 09/13/2021    CALCIUM 8.9 09/13/2021    GLUCOSENF 154 (H) 09/13/2021         Assessment:     Rachel [name] is a 80 y.o. 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     Plan:     (R06.02) Shortness of breath on exertion  (primary encounter diagnosis)  Plan: XR CHEST PA AND LATERAL  -Advised patient to present to ER if symptoms worsen/fail to improve.     (F39) Mood disorder (CMS HCC)  -Cymbalta 60 mg daily  -BuSpar 5 mg TID    (T14.8XXA) Abrasion of skin  -Doxycycline 100 mg BID x 7 days  -Keep area clean and dry    (Z00.00) Healthcare maintenance  -PPSV 23 today  -Shingrix #2 today    (G25. 81) RLS  -increase Requip to 4 mg BID.         Healthcare Maintenance:     Preventative Healthcare (Female):    Pap (>21yo q 3 years or q 5 years if negative HPV and >30yo) N/A   Mammogram (61-74 yo, 40-50 if risk, > 75 if life expectancy > 10 yrs) No results found for this or any previous visit (from the past 17520 hour(s)).     Annually or Biannually for implants   Colonoscopy (50-75 average risk or age 8 or 10 years prior to 1st degree relative diagnosis) 5 years ago colonoscopy normal. Was scheduled for EGD/Colon last month for evaluation dysphagia. Moving   CT chest (>30 pack year smoking history; yearly starting at age 62-80, current smoker or quit within 15 years) N/A   Bone Density (>65 or with risk factors) No results found for this or any previous visit (from the past [id number] hour(s)).     -Normal in last 5 years in [place]   Hemoglobin A1c (every 3 years if BMI >25 and additional risk factor - ADA)  Lab Results   Component Value Date    HA1C 6.1 (H) 09/13/2021      Lipids  Lab Results   Component Value Date    CHOLESTEROL 230 (H) 09/13/2021    HDLCHOL 72 09/13/2021    LDLCHOL 125 (H) 09/13/2021    TRIG 167 (H) 09/13/2021      The ASCVD Risk score (Arnett DK, et al., 2019) failed to calculate for the following reasons:    Unable to determine if patient is Non-Hispanic African American   Depression Screening with PHQ-2 (yearly) Little interest or pleasure in doing things.: 0  Feeling down, depressed, or hopeless: 0  PHQ 2 Total: 0     HIV screening (one time) No results found for: RHIVAB, HIVCO   HCV screening (one time) No results found for: HEPCAB   Tetanus Booster (every 10 years, 5 years if wound) Reports previously received in [place]  Flu Shot (yearly) Reports previously received in [place]   Pneumonia Vaccine (>65yo, or CAD/DM/COPD/liver disease, or smoking every 5 years or once after 24) [id number]  PCV23: 2022, Completed today   Zoster vaccination (>50yo) Done     Follow Up:       Return to clinic in 1 month.   Labs and Imaging Needing Follow Up:  CXR  Goals for Next Visit:  F/U RLE abrasion; shortness of breath, mood    Patient had no questions regarding treatment plan, goals, risks or benefits and agrees to contact me or my clinic in the interim should any questions or problems arise.    Gwendolyn Lima, APRN,FNP-BC  Internal Medicine  [place]         The patient was seen independently        On the day of the encounter, a total of  60 minutes was spent on this patient encounter including review of historical information, examination, documentation and post-visit activities.

## 2021-10-03 NOTE — Telephone Encounter (Signed)
Patient saw Gwendolyn Lima, NP today in the clinic. Patient stated that the pain clinic is saying that the PCP needs to prescribe the initial medication.  [place], LPN  80/22/3361, 22:44

## 2021-10-04 ENCOUNTER — Inpatient Hospital Stay (HOSPITAL_BASED_OUTPATIENT_CLINIC_OR_DEPARTMENT_OTHER)
Admission: RE | Admit: 2021-10-04 | Discharge: 2021-10-04 | Disposition: A | Discharge status: Home / Self Care | Payer: Medicare Other | Source: Ambulatory Visit

## 2021-10-04 ENCOUNTER — Ambulatory Visit
Admission: RE | Admit: 2021-10-04 | Discharge: 2021-10-04 | Disposition: A | Discharge status: Home / Self Care | Payer: Medicare Other | Source: Ambulatory Visit | Attending: Family | Admitting: Family

## 2021-10-04 ENCOUNTER — Other Ambulatory Visit: Payer: Self-pay

## 2021-10-04 ENCOUNTER — Encounter (HOSPITAL_BASED_OUTPATIENT_CLINIC_OR_DEPARTMENT_OTHER): Payer: Self-pay

## 2021-10-04 ENCOUNTER — Telehealth (INDEPENDENT_AMBULATORY_CARE_PROVIDER_SITE_OTHER): Payer: Self-pay | Admitting: NURSE PRACTITIONER-ADULT HEALTH

## 2021-10-04 DIAGNOSIS — R0602 Shortness of breath: Secondary | ICD-10-CM

## 2021-10-04 HISTORY — DX: Presence of spectacles and contact lenses: Z97.3

## 2021-10-04 LAB — POC BLOOD GLUCOSE (RESULTS): GLUCOSE, POC: 88 mg/dL (ref 70–105)

## 2021-10-04 MED ORDER — OXYCODONE 10 MG TABLET
10.0000 mg | ORAL_TABLET | Freq: Four times a day (QID) | ORAL | 0 refills | Status: DC | PRN
Start: 2021-10-04 — End: 2021-10-20

## 2021-10-04 NOTE — Telephone Encounter (Signed)
Spoke with patient and scheduled opioid eval with Truddie Hidden on 10/12/21.  10/04/2021  Vernie Ammons, CASE MANAGER  10/04/2021, 14:36

## 2021-10-09 ENCOUNTER — Encounter (INDEPENDENT_AMBULATORY_CARE_PROVIDER_SITE_OTHER): Payer: Self-pay | Admitting: NURSE PRACTITIONER-ADULT HEALTH

## 2021-10-12 ENCOUNTER — Ambulatory Visit (INDEPENDENT_AMBULATORY_CARE_PROVIDER_SITE_OTHER): Payer: Self-pay | Admitting: Counselor

## 2021-10-12 ENCOUNTER — Telehealth (INDEPENDENT_AMBULATORY_CARE_PROVIDER_SITE_OTHER): Payer: Self-pay | Admitting: Counselor

## 2021-10-12 NOTE — Telephone Encounter (Signed)
Spoke with patient and rescheduled missed opioid evaluation for 11/01/21. Patient arrived today approximately 25 min late for her appointment and provider was unable to see her. She has surgery on 10/23/21 and will need 10 days after that before she can come to an appointment. She is rescheduled with Truddie Hidden.  10/12/2021  Vernie Ammons, CASE MANAGER  10/12/2021, 15:34

## 2021-10-18 ENCOUNTER — Other Ambulatory Visit: Payer: Self-pay

## 2021-10-18 ENCOUNTER — Ambulatory Visit (HOSPITAL_BASED_OUTPATIENT_CLINIC_OR_DEPARTMENT_OTHER): Payer: Self-pay | Admitting: Internal Medicine

## 2021-10-18 ENCOUNTER — Ambulatory Visit: Payer: Medicare Other | Attending: Internal Medicine | Admitting: GENERAL PRACTICE

## 2021-10-18 VITALS — Temp 98.2°F | Ht 63.0 in | Wt 106.0 lb

## 2021-10-18 DIAGNOSIS — Z1283 Encounter for screening for malignant neoplasm of skin: Secondary | ICD-10-CM

## 2021-10-18 DIAGNOSIS — T07XXXA Unspecified multiple injuries, initial encounter: Secondary | ICD-10-CM

## 2021-10-18 DIAGNOSIS — D692 Other nonthrombocytopenic purpura: Secondary | ICD-10-CM

## 2021-10-18 DIAGNOSIS — Z0000 Encounter for general adult medical examination without abnormal findings: Secondary | ICD-10-CM

## 2021-10-18 MED ORDER — TELFA 3" X 8" BANDAGE
CUTANEOUS | 3 refills | Status: DC
Start: 2021-10-18 — End: 2022-12-31

## 2021-10-18 NOTE — Telephone Encounter (Signed)
-----   Message from Penne Lash sent at 10/18/2021 12:33 PM EDT -----  Rachel [name] stopped by the front desk to leave a message for Dr Megan Salon. She has not seen pain management yet (scheduled on 11/9) and is needing a refill of her pain medication and refill of Dexilant.

## 2021-10-18 NOTE — Progress Notes (Signed)
Dermatology Clinic, [facility]  [place] [address]  [phone]    Date:   10/18/2021  Name: Rachel [name]  Age: 80 y.o.  Last visit: Visit date not found    Chief complaint: skin tears    HPI  Rachel [name] is a 80 y.o. female with no personal  history of skin cancer, presenting for concern(s) of skin tears on the extremities. She notes weight loss in the last year due to dysphagia. Has discussed with PCP and has plan for endoscopy to evaluate. She notes poor overall nutrition. Does not eat much protein. albumin WNL (3.8) in 9/22. No other new, changing, bleeding, or symptomatic lesions or moles. No other skin-related complaints. She is having breast implant removal     ROS   Constitutional: Negative for chills and fever.   Skin: Negative for itching and rash.       Current Medications  Outpatient Encounter Medications as of 10/18/2021   Medication Sig Dispense Refill    busPIRone (BUSPAR) 5 mg Oral Tablet Take 1 Tablet (5 mg total) by mouth in the morning and 1 Tablet (5 mg total) at noon and 1 Tablet (5 mg total) before bedtime. Do all this for 30 days. 90 Tablet 0    Butalbital-Acetaminophen-Caff 50-300-40 mg Oral Capsule Take 1 Capsule by mouth Once per day as needed Indications: a migraine headache (Patient not taking: Reported on 10/04/2021) 30 Capsule 0    carboxymethylcellulose sodium (ARTIFICIAL TEARS, CMC, OPHT) Administer into affected eye(s) Twice daily      dexlansoprazole (DEXILANT ORAL) Take by mouth Every morning      diclofenac sodium (VOLTAREN) 1 % Gel Apply topically Three times a day      [EXPIRED] doxycycline 100 mg Oral Tablet Take 1 Tablet (100 mg total) by mouth in the morning and 1 Tablet (100 mg total) before bedtime. Do all this for 7 days. 14 Tablet 0    DULoxetine (CYMBALTA DR) 60 mg Oral Capsule, Delayed Release(E.C.) Take 60 mg by mouth Once a day      estrogen,con/m-progest acet (PREMPRO ORAL) Take by mouth Every morning       fexofenadine HCl (ALLEGRA ORAL) Take 180 mg by mouth Every morning      guaiFENesin (MUCINEX) 600 mg Oral Tablet Extended Release 12hr Take 2 Tablets (1,200 mg total) by mouth Twice daily (Patient not taking: Reported on 10/04/2021)      METHOCARBAMOL ORAL Take 500 mg by mouth Every morning      mometasone furoate (NASONEX NASL) Administer into affected nostril(s) Twice daily      [EXPIRED] Oxycodone (ROXICODONE) 10 mg Oral Tablet Take 1 Tablet (10 mg total) by mouth Every 6 hours as needed for Pain for up to 10 days Indications: osteoarthritis 40 Tablet 0    propranoloL (INDERAL LA) 60 mg Oral Capsule,Sustained Action 24 hr Take 1 Capsule (60 mg total) by mouth Once a day for 30 days 30 Capsule 0    rOPINIRole (REQUIP) 4 mg Oral Tablet Take 1 Tablet (4 mg total) by mouth in the morning and 1 Tablet (4 mg total) before bedtime. Do all this for 30 days. 60 Tablet 0    turmeric root extract 500 mg Oral Tablet Take 1 Tablet (500 mg total) by mouth Once a day (Patient not taking: No sig reported) 90 Tablet 3    [DISCONTINUED] Oxycodone (ROXICODONE) 10 mg Oral Tablet Take 1 Tablet (10 mg total) by mouth Every 6  hours as needed for Pain for up to 10 days Indications: osteoarthritis 40 Tablet 0    [DISCONTINUED] rOPINIRole (REQUIP) 2 mg Oral Tablet Take 2 mg by mouth Twice daily       No facility-administered encounter medications on file as of 10/18/2021.        Allergies   Allergen Reactions    Latex     Darvon [Propoxyphene] Mental Status Effect     Hallucinations    Shellfish Derived         Past Medical History:   Diagnosis Date    Depression     GERD (gastroesophageal reflux disease)     Low back pain     Neck problem     Osteoarthritis     Osteoarthritis     Wears glasses             Physical Exam  Vitals:    Temp:  [36.8 C (98.2 F)] 36.8 C (98.2 F) (10/26 1129)   Vitals:    10/18/21 1129   Temp: 36.8 C (98.2 F)   Weight: 48.1 kg (106 lb 0.7 oz)   Height: 1.6 m (5\' 3" )        Physical Exam  Constitutional:        Appearance: Normal appearance.   Skin:     General: Skin is warm and dry.          Neurological:      Mental Status: She is alert.        Constitutional: she appears well-developed and well-nourished.   Skin: Skin is warm and dry.     General skin exam was performed including head, neck, anterior/posterior trunk, bilateral upper and lower extremities and revealed no areas of concern other than those documented.     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 Recommend Vaseline, Telfa, and gauze wrap for wound care daily. Rx Tefla. No evidence of infection today. Skin tears do not preclude her from surgery for implant removal in my opinion.   - Referral to nutrition placed per pt request. Albumin WNL in 9/22.    - Recommend SPF 30 sunscreen and protective clothing when out.      RTC 12 months or sooner if needed.    Nonnie Done, MD 10/18/2021 11:35  [facility]  Department of Dermatology  PGY-4 Resident       I saw and examined the patient.  I reviewed the resident's note.  I agree with the findings and plan of care as documented in the resident's note.  Any exceptions/additions are edited/noted.    Mauri Reading, MD

## 2021-10-19 ENCOUNTER — Encounter (INDEPENDENT_AMBULATORY_CARE_PROVIDER_SITE_OTHER): Payer: Self-pay

## 2021-10-20 ENCOUNTER — Telehealth (INDEPENDENT_AMBULATORY_CARE_PROVIDER_SITE_OTHER): Payer: Self-pay | Admitting: NURSE PRACTITIONER-ADULT HEALTH

## 2021-10-20 ENCOUNTER — Other Ambulatory Visit (HOSPITAL_BASED_OUTPATIENT_CLINIC_OR_DEPARTMENT_OTHER): Payer: Self-pay | Admitting: Internal Medicine

## 2021-10-20 DIAGNOSIS — M1990 Unspecified osteoarthritis, unspecified site: Secondary | ICD-10-CM

## 2021-10-20 DIAGNOSIS — K219 Gastro-esophageal reflux disease without esophagitis: Secondary | ICD-10-CM

## 2021-10-20 NOTE — Telephone Encounter (Signed)
Regarding: second request for refills   pt is out  ----- Message from Burnadette Pop sent at 10/20/2021  4:14 PM EDT -----  Second request for rx     [name], MD [name], MD    Outpatient Medication Detail     Disp Refills Start End   Oxycodone (ROXICODONE) 10 mg Oral Tablet 40 Tablet 0 10/04/2021 10/14/2021   Sig - Route: Take 1 Tablet (10 mg total) by mouth Every 6 hours as needed for Pain for up to 10 days Indications: osteoarthritis - Oral   Sent to pharmacy as: oxyCODONE 10 mg tablet (ROXICODONE)   Class: E-Rx   Earliest Fill Date: 10/04/2021   Non-formulary Exception Code: ACZYS/AY Formulary Info Available   E-Prescribing Status: Receipt confirmed by pharmacy (10/04/2021 10:52 AM EDT)     dexlansoprazole (DEXILANT ORAL)       Sig - Route: Take by mouth Every morning - Oral   Class: Historical Med         Associated Diagnoses    Osteoarthritis, unspecified osteoarthritis type, unspecified site   refilled oxycodone for 10 day supply but d/w patient that I do not feel comfortable with her being on opiates and would prefer a pain management referral          ----- Message from Penne Lash sent at 10/18/2021 12:33 PM EDT -----  Joycelyn Schmid stopped by the front desk to leave a message for Dr Megan Salon. She has not seen pain management yet (scheduled on 11/9) and is needing a refill of her pain medication and refill of Dexilant.

## 2021-10-20 NOTE — Telephone Encounter (Signed)
Case manager contacted patient to reschedule 11/01/21 appointment with Truddie Hidden to a later time due to provider unavailability. Patient is scheduled for 11/01/21 at 2pm. Voice message left with appointment information and appointment reminder mailed.    10/20/2021  Geoffery Spruce, CASE MANAGER  10/20/2021, 09:37

## 2021-10-23 ENCOUNTER — Encounter (HOSPITAL_COMMUNITY): Payer: Medicare Other | Admitting: Hand Surgery

## 2021-10-23 ENCOUNTER — Inpatient Hospital Stay
Admission: RE | Admit: 2021-10-23 | Discharge: 2021-10-23 | Disposition: A | Discharge status: Home / Self Care | Payer: Medicare Other | Source: Ambulatory Visit | Attending: Hand Surgery | Admitting: Hand Surgery

## 2021-10-23 ENCOUNTER — Ambulatory Visit (HOSPITAL_COMMUNITY): Payer: Medicare Other | Admitting: Family

## 2021-10-23 ENCOUNTER — Encounter (HOSPITAL_COMMUNITY): Payer: Self-pay | Admitting: Hand Surgery

## 2021-10-23 ENCOUNTER — Other Ambulatory Visit: Payer: Self-pay

## 2021-10-23 ENCOUNTER — Ambulatory Visit (HOSPITAL_BASED_OUTPATIENT_CLINIC_OR_DEPARTMENT_OTHER): Payer: Medicare Other | Admitting: Certified Registered"

## 2021-10-23 ENCOUNTER — Encounter (HOSPITAL_COMMUNITY)
Admission: RE | Disposition: A | Discharge status: Home / Self Care | Payer: Self-pay | Source: Ambulatory Visit | Attending: Hand Surgery

## 2021-10-23 DIAGNOSIS — T8544XA Capsular contracture of breast implant, initial encounter: Secondary | ICD-10-CM

## 2021-10-23 SURGERY — REMOVAL IMPLANT BREAST
Anesthesia: General | Site: Breast | Laterality: Bilateral | Wound class: Clean Wound: Uninfected operative wounds in which no inflammation occurred

## 2021-10-23 MED ORDER — SODIUM CHLORIDE 0.9 % (FLUSH) INJECTION SYRINGE
2.0000 mL | INJECTION | INTRAMUSCULAR | Status: DC | PRN
Start: 2021-10-23 — End: 2021-10-23

## 2021-10-23 MED ORDER — DEXTROSE 5% IN WATER (D5W) FLUSH BAG - 250 ML
INTRAVENOUS | Status: DC | PRN
Start: 2021-10-23 — End: 2021-10-23

## 2021-10-23 MED ORDER — SODIUM CHLORIDE 0.9 % (FLUSH) INJECTION SYRINGE
2.0000 mL | INJECTION | Freq: Three times a day (TID) | INTRAMUSCULAR | Status: DC
Start: 2021-10-23 — End: 2021-10-23

## 2021-10-23 MED ORDER — PHENYLEPHRINE 1 MG/10 ML (100 MCG/ML) IN 0.9 % SOD.CHLORIDE IV SYRINGE
INJECTION | Freq: Once | INTRAVENOUS | Status: DC | PRN
Start: 2021-10-23 — End: 2021-10-23
  Administered 2021-10-23: 100 ug via INTRAVENOUS

## 2021-10-23 MED ORDER — LIDOCAINE (PF) 100 MG/5 ML (2 %) INTRAVENOUS SYRINGE
INJECTION | Freq: Once | INTRAVENOUS | Status: DC | PRN
Start: 2021-10-23 — End: 2021-10-23
  Administered 2021-10-23: 80 mg via INTRAVENOUS

## 2021-10-23 MED ORDER — SODIUM CHLORIDE 0.9% FLUSH BAG - 250 ML
INTRAVENOUS | Status: DC | PRN
Start: 2021-10-23 — End: 2021-10-23

## 2021-10-23 MED ORDER — ACETAMINOPHEN 300 MG-CODEINE 30 MG TABLET
1.0000 | ORAL_TABLET | ORAL | 0 refills | Status: DC | PRN
Start: 2021-10-23 — End: 2021-11-13
  Filled 2021-10-23: qty 15, 3d supply, fill #0

## 2021-10-23 MED ORDER — FENTANYL (PF) 50 MCG/ML INJECTION SOLUTION
12.5000 ug | INTRAMUSCULAR | Status: DC | PRN
Start: 2021-10-23 — End: 2021-10-23

## 2021-10-23 MED ORDER — SUGAMMADEX 100 MG/ML INTRAVENOUS SOLUTION
INTRAVENOUS | Status: AC
Start: 2021-10-23 — End: 2021-10-23
  Filled 2021-10-23: qty 2

## 2021-10-23 MED ORDER — OXYCODONE 10 MG TABLET
10.0000 mg | ORAL_TABLET | Freq: Four times a day (QID) | ORAL | 0 refills | Status: DC | PRN
Start: 2021-10-23 — End: 2021-12-08

## 2021-10-23 MED ORDER — ONDANSETRON HCL (PF) 4 MG/2 ML INJECTION SOLUTION
Freq: Once | INTRAMUSCULAR | Status: DC | PRN
Start: 2021-10-23 — End: 2021-10-23
  Administered 2021-10-23: 4 mg via INTRAVENOUS

## 2021-10-23 MED ORDER — PROPOFOL 10 MG/ML IV BOLUS
INJECTION | Freq: Once | INTRAVENOUS | Status: DC | PRN
Start: 2021-10-23 — End: 2021-10-23
  Administered 2021-10-23: 30 mg via INTRAVENOUS
  Administered 2021-10-23: 170 mg via INTRAVENOUS

## 2021-10-23 MED ORDER — LACTATED RINGERS INTRAVENOUS SOLUTION
INTRAVENOUS | Status: DC
Start: 2021-10-23 — End: 2021-10-23

## 2021-10-23 MED ORDER — ROCURONIUM 10 MG/ML INTRAVENOUS SOLUTION
Freq: Once | INTRAVENOUS | Status: DC | PRN
Start: 2021-10-23 — End: 2021-10-23
  Administered 2021-10-23: 50 mg via INTRAVENOUS

## 2021-10-23 MED ORDER — BUPIVACAINE (PF) 0.25 % (2.5 MG/ML) INJECTION SOLUTION
INTRAMUSCULAR | Status: AC
Start: 2021-10-23 — End: 2021-10-23
  Filled 2021-10-23: qty 30

## 2021-10-23 MED ORDER — DEXAMETHASONE SODIUM PHOSPHATE 4 MG/ML INJECTION SOLUTION
4.0000 mg | Freq: Once | INTRAMUSCULAR | Status: DC | PRN
Start: 2021-10-23 — End: 2021-10-23

## 2021-10-23 MED ORDER — ONDANSETRON HCL (PF) 4 MG/2 ML INJECTION SOLUTION
4.0000 mg | Freq: Once | INTRAMUSCULAR | Status: DC | PRN
Start: 2021-10-23 — End: 2021-10-23

## 2021-10-23 MED ORDER — FENTANYL (PF) 50 MCG/ML INJECTION SOLUTION
INTRAMUSCULAR | Status: AC
Start: 2021-10-23 — End: 2021-10-23
  Filled 2021-10-23: qty 2

## 2021-10-23 MED ORDER — DEXAMETHASONE SODIUM PHOSPHATE 4 MG/ML INJECTION SOLUTION
Freq: Once | INTRAMUSCULAR | Status: DC | PRN
Start: 2021-10-23 — End: 2021-10-23
  Administered 2021-10-23: 8 mg via INTRAVENOUS

## 2021-10-23 MED ORDER — BUPIVACAINE LIPOSOME(PF) 1.3 %(13.3 MG/ML) SUSPENSION FOR INFILTRATION
30.0000 mL | Freq: Once | Status: DC | PRN
Start: 2021-10-23 — End: 2021-10-23
  Administered 2021-10-23: 15:00:00 30 mL via INTRAMUSCULAR
  Filled 2021-10-23: qty 20

## 2021-10-23 MED ORDER — FENTANYL (PF) 50 MCG/ML INJECTION SOLUTION
25.0000 ug | INTRAMUSCULAR | Status: AC | PRN
Start: 2021-10-23 — End: 2021-10-23
  Administered 2021-10-23 (×4): 25 ug via INTRAVENOUS

## 2021-10-23 MED ORDER — DEXTROSE 5 % IN WATER (D5W) INTRAVENOUS SOLUTION
2.0000 g | Freq: Once | INTRAVENOUS | Status: AC
Start: 2021-10-23 — End: 2021-10-23
  Administered 2021-10-23: 14:00:00 2 g via INTRAVENOUS
  Filled 2021-10-23: qty 20

## 2021-10-23 MED ORDER — FENTANYL (PF) 50 MCG/ML INJECTION SOLUTION
Freq: Once | INTRAMUSCULAR | Status: DC | PRN
Start: 2021-10-23 — End: 2021-10-23
  Administered 2021-10-23: 100 ug via INTRAVENOUS

## 2021-10-23 MED ORDER — PROCHLORPERAZINE EDISYLATE 10 MG/2 ML (5 MG/ML) INJECTION SOLUTION
5.0000 mg | Freq: Once | INTRAMUSCULAR | Status: DC | PRN
Start: 2021-10-23 — End: 2021-10-23

## 2021-10-23 MED ORDER — FENTANYL (PF) 50 MCG/ML INJECTION SOLUTION
25.0000 ug | INTRAMUSCULAR | Status: DC | PRN
Start: 2021-10-23 — End: 2021-10-23
  Filled 2021-10-23: qty 2

## 2021-10-23 MED ORDER — SUGAMMADEX 100 MG/ML INTRAVENOUS SOLUTION
Freq: Once | INTRAVENOUS | Status: DC | PRN
Start: 2021-10-23 — End: 2021-10-23
  Administered 2021-10-23: 200 mg via INTRAVENOUS

## 2021-10-23 MED ORDER — CEPHALEXIN 500 MG CAPSULE
500.0000 mg | ORAL_CAPSULE | Freq: Four times a day (QID) | ORAL | 0 refills | Status: DC
Start: 2021-10-23 — End: 2021-11-13
  Filled 2021-10-23: qty 28, 7d supply, fill #0

## 2021-10-23 MED ORDER — DEXLANSOPRAZOLE 30 MG CAPSULE,BIPHASE DELAYED RELEASE
30.0000 mg | DELAYED_RELEASE_CAPSULE | Freq: Every morning | ORAL | 4 refills | Status: DC
Start: 2021-10-23 — End: 2021-10-23

## 2021-10-23 SURGICAL SUPPLY — 25 items
APPL 70% ISPRP 2% CHG 26ML 13._2X13.2IN CHLRPRP PREP DEHP-FR (MED SURG SUPPLIES) ×1
APPL 70% ISPRP 2% CHG 26ML CHLRPRP HI-LT ORNG PREP STRL LF  DISP CLR (MED SURG SUPPLIES) ×2 IMPLANT
BANDAGE ACE 5YDX6IN STRL 2 SLF_CLS CLIP ELAS KNIT SUP ABS (WOUND CARE SUPPLY) ×4 IMPLANT
BANDAGE ACE 5YDX6IN STRL 2 SLF_CLS CLIP ELAS KNIT SUP ABS (WOUND CARE/ENTEROSTOMAL SUPPLY) ×2
BLANKET MISTRAL-AIR ADULT LWR BODY 55.9X40.2IN FRC AIR HI VOL BLWR INTUITIVE CONTROL PNL LRG LED (MED SURG SUPPLIES) ×2 IMPLANT
BLANKET WARMER 55.9INW X 40.2I_LOWER BODY MISTRAL-AIR (MED SURG SUPPLIES) ×2
CONV USE 320027 - CHILDRENS USE 320025 - KIT RM TURNOVER CSTM NONST LF (DRAPE/PACKS/SHEETS/OR TOWEL) ×2
CONV USE 320027 - CHILDRENS USE 320025 - KIT RM TURNOVER CUSTOM NONST LF (DRAPE/PACKS/SHEETS/OR TOWEL) ×2 IMPLANT
CONV USE 338642 - PACK SURG PLASTIC STRL DISP LTX (CUSTOM TRAYS & PACK) ×2 IMPLANT
DRAIN INCS 15FR JP SIL CHNL TROCAR STRL LF  RND DISP WHT (MED SURG SUPPLIES) ×4 IMPLANT
DRAIN WND TROCAR HUBLESS 15FR_JP2229 10/BX (MED SURG SUPPLIES) ×2
DUPE USE ITEM 319485 - SUTURE 3-0 PS2 MONOCRYL + MTPS_18IN MONOF ABS (SUTURE/WOUND CLOSURE) ×8 IMPLANT
ELECTRODE ESURG BLADE 6.5IN 3/32IN EDGE STRL .2IN DISP INSL STD SHAFT XTD LF (SURGICAL CUTTING SUPPLIES) ×2 IMPLANT
ELECTRODE ESURG BLADE 6.5IN 3/_32IN EDGE STRL .2IN DISP INSL (CUTTING ELEMENTS) ×1
KIT ROOM TURNOVER ~~LOC~~ CUSTOM (DRAPE/PACKS/SHEETS/OR TOWEL) ×1
PACK CUSTOM PLASTIC_CS/1 (CUSTOM TRAYS & PACK) ×1
RESERVOIR DRAIN SIL JP BULB 100CC STRL LF  DISP (MED SURG SUPPLIES) ×4 IMPLANT
RESERVOIR DRAIN SIL JP BULB 10_0CC STRL LF DISP (MED SURG SUPPLIES) ×2
RETRACTR 135X30MM ONETRAC LX 2_SMOKE EVAC CHNL CRDLS INTGR (MED SURG SUPPLIES) ×1
RETRACTR 135X30MM ONETRAC LX BUIL IN MULTILED LIGHT SRC SURG STRL DISP (MED SURG SUPPLIES) ×2 IMPLANT
SUTURE 3-0 PS2 MONOCRYL + MTPS_18IN MONOF ABS (SUTURE/WOUND CLOSURE) ×4
SUTURE 4-0 PS2 MONOCRYL + MTPS 18IN MONOF ANBCTRL ABS (SUTURE/WOUND CLOSURE) ×2 IMPLANT
SUTURE 4-0 PS2 MONOCRYL + MTPS_18IN UNDYED MONOF ABS (SUTURE/WOUND CLOSURE) ×1
WIPE PRSNL PREMIERPRO SPUNLACE RESL 12X8IN LRG 50 GSM LF  DISP UNSCNT (MED SURG SUPPLIES) ×2 IMPLANT
WIPES UNSCNT FLSH 8 X 12 (MED SURG SUPPLIES) ×1

## 2021-10-23 NOTE — Discharge Summary (Signed)
Patient adequate for discharge. Tolerating PO fluids. Dressing clean, dry, and intact. Patient and spouse educated on discharge instructions. All questions encouraged and answered. No further questions by patient and spouse. IV removed. Patient transported to car via wheelchair.

## 2021-10-23 NOTE — Anesthesia Transfer of Care (Signed)
ANESTHESIA TRANSFER OF CARE   Rachel [name] is a 81 y.o. ,female, Weight: 46.9 kg (103 lb 6.3 oz)   had Procedure(s):  REMOVAL IMPLANT BREAST  CAPSULECTOMY BREAST  performed  10/23/21   Primary Service: Redgie Grayer, MD    Past Medical History:   Diagnosis Date   . Depression    . GERD (gastroesophageal reflux disease)    . Low back pain    . Neck problem    . Osteoarthritis    . Osteoarthritis    . Wears glasses       Allergy History as of 10/23/21     LATEX       Noted Status Severity Type Reaction    09/13/21 1059 Melvyn Novas, RN 09/13/21 Active             SHELLFISH DERIVED       Noted Status Severity Type Reaction    09/13/21 1059 Melvyn Novas, RN 09/13/21 Active             PROPOXYPHENE       Noted Status Severity Type Reaction    10/04/21 1001 Hedwig Morton, [place] 10/04/21 Active   Mental Status Effect    Comments: Hallucinations               I completed my transfer of care / handoff to the receiving personnel during which we discussed:  Access, Airway, All key/critical aspects of case discussed, Analgesia, Antibiotics, Expectation of post procedure, Fluids/Product, Gave opportunity for questions and acknowledgement of understanding, Labs and PMHx    Post Location: PACU                                          Additional Info:To PACU on 6L SFM. VSS. Report and transfer of care to PACU RN.Marland Kitchen                        Last OR Temp: Temperature: 36 C (96.8 F)  ABG:  POTASSIUM   Date Value Ref Range Status   09/13/2021 4.3 3.5 - 5.1 mmol/L Final     CALCIUM   Date Value Ref Range Status   09/13/2021 8.9 8.8 - 10.2 mg/dL Final     Airway:* No LDAs found *  Blood pressure (!) 202/90, pulse 85, temperature 36 C (96.8 F), resp. rate (!) 22, height 1.626 m (5\' 4" ), weight 46.9 kg (103 lb 6.3 oz), SpO2 100 %.

## 2021-10-23 NOTE — Anesthesia Preprocedure Evaluation (Signed)
ANESTHESIA PRE-OP EVALUATION  Planned Procedure: REMOVAL IMPLANT BREAST (Bilateral: Breast)  Review of Systems         patient summary reviewed  nursing notes reviewed        Pulmonary     Cardiovascular           GI/Hepatic/Renal    GERD        Endo/Other    osteoarthritis,      Neuro/Psych/MS    depression     Cancer                      Physical Assessment      Airway       Mallampati: II    TM distance: >3 FB    Neck ROM: full  Mouth Opening: good.  No Facial hair  No Beard  No endotracheal tube present  No Tracheostomy present    Dental       Dentition intact             Pulmonary           Cardiovascular      Rate: Normal       Other findings            Plan  ASA 2     Planned anesthesia type: general     general anesthesia with endotracheal tube intubation    plan to administer opioids postoperatively            PONV/POV Plan:  I plan to administer pharmcologic prophalaxis antiemetics  Intravenous induction     Anesthesia issues/risks discussed are: Nerve Injuries, Dental Injuries, PONV, Post-op Pain Management, Cardiac Events/MI, Aspiration, Sore Throat, Post-op Agitation/Tantrum, Intraoperative Awareness/ Recall, Post-op Cognitive Dysfunction, Stroke and Blood Loss.  Anesthetic plan and risks discussed with patient  Signed consent obtained        Use of blood products discussed with patient who consented to blood products.     Patient's NPO status is appropriate for Anesthesia.           Plan discussed with CRNA.

## 2021-10-23 NOTE — Brief Op Note (Signed)
[place]                                                     BRIEF OPERATIVE NOTE    Patient Name: [facility] Number: [id number]   Date of Service: 10/23/21   Date of Birth: March 05, 1941     All elements must be documented.    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    Attending Surgeon: Redgie Grayer MD  Assistant(s): Everitt Amber MD, Baxter Flattery MD    Anesthesia Type: General  Estimated Blood Loss:  Minimal  Blood Given: None  Fluids Given: 381 cc  Complications (not routinely expected or not inherent to difficulty/nature of procedure): N/A  Wound Class: Clean Wound: Uninfected operative wounds in which no inflammation occurred    Tubes: None  Drains: Terrial Rhodes Drain x2 in bilateral incisions  Specimens/ Cultures: Breast implants sent for gross pathology  Implants: None           Disposition: PACU - hemodynamically stable.  Condition: stable    PLEASE DISCHARGE PATIENT TO HOME POSTOP IF MEETS [facility] CRITERIA.    Baxter Flattery, MD 10/23/2021 15:39       I saw and examined the patient.  I reviewed the resident's note.  I agree with the findings and plan of care as documented in the resident's note.  Any exceptions/additions are edited/noted.    Redgie Grayer, MD

## 2021-10-23 NOTE — H&P (Signed)
[facility]  Pre-Operative H&P  Plastic Surgery    Rachel [name], Rachel [name], 80 y.o. female  Date of Birth:  [birth year]  Date of service: 10/23/2021    Information Obtained from: patient and history reviewed via medical record  Chief Complaint: Painful breast implants    PCP: [name], MD     [name] is a 80 y.o., female with PMH of painful breast implants who presents for removal of bilateral breast implants. She reports since her last visit with Dr. Clarise Cruz on 09/18/21, she has not had any changes in medications or medical history. She is currently not taking Doxycycline. She was seen by Dermatology for skin tears of her lower extremities and right arm. She states she was cleared for surgery and there was no concern for acute infection. She denies fever, chills, N/V, CP, and SOB.     Per prior H&P: "Rachel [name] is a 80 y.o. female who presents today with concerns about the appearance of her breasts. Specifically, she is concerned about the pain associated with her implants.  Her current implants were placed in 1994, and she previously had a first pair of implants placed in 1974.  The current implants are saline implants, presumably over the muscle.  Jersee A Noecker does not have a personal history of breast pathology."    Pre-operative Risk Assessment   Baseline Dyspnea: None  Functional Health Status Prior to Surgery(Best in last 30 days): Independent - No assistance required for activities of daily living (May Use prosthetics or DME)  COPD: Patient does not have COPD  Ascites: No  CHF within 30 days: No  Hypertension requiring medications: Yes  Renal failure requiring dialysis: No  Widely metastatic cancer (Stage 4): No  Open wound: No  Weight loss > 10 % body weight in last 6 months: No  Bleeding disorder: No  Urinary Symptoms None  Current UTI Treatment:No  Pneumonia Symptoms:  none  Current Pneumonia Treatment:  no    ROS:  MUST comment on all "Abnormal" findings   ROS Other  than ROS in the HPI, all other systems were negative.    PAST MEDICAL/ FAMILY/ SOCIAL HISTORY:       Past Medical History:   Diagnosis Date   . Depression    . GERD (gastroesophageal reflux disease)    . Low back pain    . Neck problem    . Osteoarthritis    . Osteoarthritis    . Wears glasses          Allergies   Allergen Reactions   . Latex    . Darvon [Propoxyphene] Mental Status Effect     Hallucinations   . Shellfish Derived      Medications Prior to Admission     Prescriptions    busPIRone (BUSPAR) 5 mg Oral Tablet    Take 1 Tablet (5 mg total) by mouth in the morning and 1 Tablet (5 mg total) at noon and 1 Tablet (5 mg total) before bedtime. Do all this for 30 days.    Butalbital-Acetaminophen-Caff 50-300-40 mg Oral Capsule    Take 1 Capsule by mouth Once per day as needed Indications: a migraine headache    Patient not taking:  Reported on 10/04/2021    carboxymethylcellulose sodium (ARTIFICIAL TEARS, CMC, OPHT)    Administer into affected eye(s) Twice daily    dexlansoprazole (DEXILANT) 30 mg Oral Cap, Delayed Rel., Multiphasic    Take 1 Capsule (30 mg total) by mouth  Every morning    diclofenac sodium (VOLTAREN) 1 % Gel    Apply topically Three times a day    doxycycline 100 mg Oral Tablet    Take 1 Tablet (100 mg total) by mouth in the morning and 1 Tablet (100 mg total) before bedtime. Do all this for 7 days.    DULoxetine (CYMBALTA DR) 60 mg Oral Capsule, Delayed Release(E.C.)    Take 60 mg by mouth Once a day    estrogen,con/m-progest acet (PREMPRO ORAL)    Take by mouth Every morning    fexofenadine HCl (ALLEGRA ORAL)    Take 180 mg by mouth Every morning    guaiFENesin (MUCINEX) 600 mg Oral Tablet Extended Release 12hr    Take 2 Tablets (1,200 mg total) by mouth Twice daily    Patient not taking:  Reported on 10/04/2021    METHOCARBAMOL ORAL    Take 500 mg by mouth Every morning    mometasone furoate (NASONEX NASL)    Administer into affected nostril(s) Twice daily    Non-Adherent Bandage (TELFA) 3 X  8 " Bandage    Apply Vaseline to wounds. Then, apply Telfa pad. Secure with gauze    Oxycodone (ROXICODONE) 10 mg Oral Tablet    Take 1 Tablet (10 mg total) by mouth Every 6 hours as needed for Pain for up to 30 days Indications: osteoarthritis    propranoloL (INDERAL LA) 60 mg Oral Capsule,Sustained Action 24 hr    Take 1 Capsule (60 mg total) by mouth Once a day for 30 days    rOPINIRole (REQUIP) 4 mg Oral Tablet    Take 1 Tablet (4 mg total) by mouth in the morning and 1 Tablet (4 mg total) before bedtime. Do all this for 30 days.    turmeric root extract 500 mg Oral Tablet    Take 1 Tablet (500 mg total) by mouth Once a day    Patient not taking:  No sig reported          Past Surgical History:   Procedure Laterality Date   . ANTERIOR FUSION CERVICAL SPINE  12/30/2020   . FOOT SURGERY Bilateral    . HX BREAST AUGMENTATION Bilateral     x3, 1974, 1994, 2014   . HX CATARACT REMOVAL Bilateral 01/12/2021   . HX ROTATOR CUFF REPAIR Right    . SACROILIAC JOINT INJECTION     . TOE AMPUTATION           Family Medical History:     Problem Relation (Age of Onset)    No Known Problems Mother, Father          Social History     Tobacco Use   . Smoking status: Never   . Smokeless tobacco: Never   Vaping Use   . Vaping Use: Never used   Substance Use Topics   . Alcohol use: Not Currently   . Drug use: Not Currently     Frequency: 5.0 times per week     Types: Marijuana     Comment: medical marijuana gel         PHYSICAL EXAMINATION: MUST comment on all "Abnormal" findings    Exam  Vitals: BP (!) 175/87   Pulse 85   Temp 36.7 C (98.1 F)   Resp 16   Ht 1.626 m (5\' 4" )   Wt 46.9 kg (103 lb 6.3 oz)   SpO2 99%   BMI 17.75 kg/m  General: alert, no distress, appears stated age  Head: normocephalic and atraumatic  Eyes:Conjunctiva clear, EOM normal  QVZ:DGLO without erythema or injection, mucous membranes moist.  Respiratory:Non-labored respirations, symmetrical rising of chest  Breasts: No obvious masses  bilaterally, bilateral grade 3 ptosis, implants sitting low on chest wall. Tenderness to palpation of bilateral breast.   Cardiovascular:regular rate and rhythm  Abdomen: Soft, non distended  Neuro: alert and oriented x 3, CN II-XII grossly normal  Vascular: well perfused with normal pulses in the distal extremities   Skin: warm, dry. Healed RUE and RLE wound with dry eschar and no evidence of cellulitic changes or purulent drainage.     Labs Ordered/ Reviewed (Please indicate ordered or reviewed)   N/A    Radiology Tests Ordered/ Reviewed (Please indicate ordered or reviewed)   N/A    IMPRESSION:    Rachel [name] is a 80 y.o., female with PMH of painful breast implants who presents for removal of bilateral breast implants.    Recommendations   -Proceed to OR as planned  -Consent obtained  -Skin to be marked by Dr. Clarise Cruz  -All questions answered       Cleda Mccreedy, PA-C 10/23/2021     I personally saw and evaluated the patient. See mid-level's note for additional details. My findings/participation are patient marked and all questions answered.    Redgie Grayer, MD

## 2021-10-23 NOTE — Discharge Instructions (Signed)
SURGICAL DISCHARGE INSTRUCTIONS     Dr. Redgie Grayer, MD  performed your REMOVAL IMPLANT BREAST, CAPSULECTOMY BREAST today at the [facility]:  Monday through Friday from 6 a.m. - 7 p.m.: [phone]  Between 7 p.m. - 6 a.m., weekends and holidays:  Call [facility] at [phone] or [phone].    PLEASE SEE WRITTEN HANDOUTS AS DISCUSSED BY YOUR NURSE.    SIGNS AND SYMPTOMS OF A WOUND / INCISION INFECTION   Be sure to watch for the following:  Increase in redness or red streaks near or around the wound or incision.  Increase in pain that is intense or severe and cannot be relieved by the pain medication that your doctor has given you.  Increase in swelling that cannot be relieved by elevation of a body part, or by applying ice, if permitted.  Increase in drainage, or if yellow / green in color and smells bad. This could be on a dressing or a cast.  Increase in fever for longer than 24 hours, or an increase that is higher than 101 degrees Fahrenheit (normal body temperature is 98 degrees Fahrenheit). The incision may feel warm to the touch.    **CALL YOUR DOCTOR IF ONE OR MORE OF THESE SIGNS / SYMPTOMS SHOULD OCCUR.    ANESTHESIA INFORMATION   ANESTHESIA -- ADULT PATIENTS:  You have received intravenous sedation / general anesthesia, and you may feel drowsy and light-headed for several hours. You may even experience some forgetfulness of the procedure. DO NOT DRIVE A MOTOR VEHICLE or perform any activity requiring complete alertness or coordination until you feel fully awake in about 24-48 hours. Do not drink alcoholic beverages for at least 24 hours. Do not stay alone, you must have a responsible adult available to be with you. You may also experience a dry mouth or nausea for 24 hours. This is a normal side effect and will disappear as the effects of the medication wear off.    REMEMBER   If you experience any difficulty breathing, chest pain, bleeding that you feel  is excessive, persistent nausea or vomiting or for any other concerns:  Call your physician Dr. Santiago Glad at [phone] or [phone]. You may also ask to have the plastic surgery doctor on call paged. They are available to you 24 hours a day.    SPECIAL INSTRUCTIONS / COMMENTS   Please see handouts.    FOLLOW-UP APPOINTMENTS   Please call patient services at [phone] or [phone] to schedule a date / time of return. They are open Monday - Friday from 7:30 am - 5:00 pm.

## 2021-10-23 NOTE — Discharge Summary (Signed)
[facility]  DISCHARGE SUMMARY    PATIENT NAME:  Rachel [name], Rachel [name]  MRN:  [id number]  DOB:  [birth year]    ENCOUNTER DATE:  10/23/2021  INPATIENT ADMISSION DATE:   DISCHARGE DATE:  10/23/2021    ATTENDING PHYSICIAN: Redgie Grayer, MD  SERVICE: PLASTIC SURGERY  PRIMARY CARE PHYSICIAN: [name], MD         LAY CAREGIVER:  ,  ,        PRIMARY DISCHARGE DIAGNOSIS:    There are no hospital problems to display for this patient.    Active Non-Hospital Problems    Diagnosis Date Noted    uds 09/29/21 09/29/2021    Healthcare maintenance 09/13/2021    Visual impairment 09/13/2021    Dysphagia 09/13/2021    Hypoglycemia 09/13/2021    Malnutrition (CMS HCC) 09/13/2021    Osteoarthritis 09/13/2021    Migraine 09/13/2021    Left-sided chest wall pain 09/13/2021    Pre-syncope 09/13/2021    Spider veins 09/13/2021    Thick nasal mucus 09/13/2021        DISCHARGE MEDICATIONS:     Current Discharge Medication List      CONTINUE these medications - NO CHANGES were made during your visit.      Details   ALLEGRA ORAL   180 mg, Oral, EVERY MORNING  Refills: 0     ARTIFICIAL TEARS (CMC) OPHT   Ophthalmic, 2 TIMES DAILY  Refills: 0     busPIRone 5 mg Tablet  Commonly known as: BUSPAR   5 mg, Oral, 3 TIMES DAILY  Qty: 90 Tablet  Refills: 0     Butalbital-Acetaminophen-Caff 50-300-40 mg Capsule   1 Capsule, Oral, DAILY PRN  Qty: 30 Capsule  Refills: 0     dexlansoprazole 30 mg Cap, Delayed Rel., Multiphasic  Commonly known as: Dexilant   30 mg, Oral, EVERY MORNING  Qty: 90 Capsule  Refills: 4     diclofenac sodium 1 % Gel  Commonly known as: VOLTAREN   Apply Topically, 3 TIMES DAILY  Refills: 0     DULoxetine 60 mg Capsule, Delayed Release(E.C.)  Commonly known as: CYMBALTA DR   60 mg, Oral, DAILY  Refills: 0     guaiFENesin 600 mg Tablet Extended Release 12hr  Commonly known as: MUCINEX   1,200 mg, Oral, 2 TIMES DAILY  Refills: 0     METHOCARBAMOL ORAL   500 mg, Oral, EVERY MORNING  Refills: 0     NASONEX NASL    Nasal, 2 TIMES DAILY  Refills: 0     Oxycodone 10 mg Tablet  Commonly known as: ROXICODONE   10 mg, Oral, EVERY 6 HOURS PRN  Qty: 120 Tablet  Refills: 0     PREMPRO ORAL   Oral, EVERY MORNING  Refills: 0     propranoloL 60 mg Capsule,Sustained Action 24 hr  Commonly known as: INDERAL LA   60 mg, Oral, DAILY  Qty: 30 Capsule  Refills: 0     rOPINIRole 4 mg Tablet  Commonly known as: REQUIP   4 mg, Oral, 2 TIMES DAILY  Qty: 60 Tablet  Refills: 0     Telfa 3 X 8 " Bandage  Generic drug: Non-Adherent Bandage   Apply Vaseline to wounds. Then, apply Telfa pad. Secure with gauze  Qty: 144 Each  Refills: 3     turmeric root extract 500 mg Tablet   500 mg, Oral, DAILY  Qty: 90 Tablet  Refills: 3  ASK your doctor about these medications.      Details   doxycycline 100 mg Tablet  Ask about: Should I take this medication?   100 mg, Oral, 2 TIMES DAILY  Qty: 14 Tablet  Refills: 0          Discharge med list refreshed?  YES                     ALLERGIES:  Allergies   Allergen Reactions    Latex     Darvon [Propoxyphene] Mental Status Effect     Hallucinations    Shellfish Derived              HOSPITAL PROCEDURE(S):   Bedside Procedures:  No orders of the defined types were placed in this encounter.    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 The surgery went without complication and the patient was deemed appropriate for discharge from the PACU.     BRIEF HOSPITAL NARRATIVE:         MARQUIS DILES is a 80 y.o. female who presented for day surgery for removal of bilateral breast implants for Baker Grade IV capsular contracture. The surgery went without complication. The patient  was discharged after meeting PACU discharge criteria. She was sent home with PO antibiotics, PO analgesia, and appropriate follow-up scheduled. She had two JP drains in place holding suction to bulb, and was instructed how to document outputs.      TRANSITION/POST DISCHARGE CARE/PENDING TESTS/REFERRALS: Follow-up outpatient with the Plastic Surgery team in 1 week.        CONDITION ON DISCHARGE:  A. Ambulation: Full ambulation  B. Self-care Ability: Complete  C. Cognitive Status Alert and Oriented x 3  D. Code status at discharge:             LINES/DRAINS/WOUNDS AT DISCHARGE:   Patient Lines/Drains/Airways Status     Active Line / Dialysis Catheter / Dialysis Graft / Drain / Airway / Wound     Name Placement date Placement time Site Days    Peripheral IV Right Wrist 10/23/21  1300  -- less than 1    Drain (Miscellaneous) 15 Fr Blake Left;Anterior Chest 10/23/21  1456  -- less than 1    Drain (Miscellaneous) 15 Fr Blake Anterior;Right Chest 10/23/21  1457  -- less than 1    Surgical Incision Left Breast 10/23/21  1432  -- less than 1    Surgical Incision Right Breast 10/23/21  1433  -- less than 1                DISCHARGE DISPOSITION:  Home discharge              DISCHARGE INSTRUCTIONS:    No discharge procedures on file.             Baxter Flattery, MD    Copies sent to Care Team       Relationship Specialty Notifications Start End    [name], MD PCP - General INTERNAL MEDICINE Admissions 09/13/21     Phone: [phone] Fax: [phone]  [address] [place] [address]            Referring providers can utilize [email or web address] to access their referred [place] patient's information.

## 2021-10-23 NOTE — Anesthesia Postprocedure Evaluation (Signed)
Anesthesia Post Op Evaluation    Patient: Rachel [name]  Procedure(s):  REMOVAL IMPLANT BREAST  CAPSULECTOMY BREAST    Last Vitals:Temperature: 36 C (96.8 F) (10/23/21 1539)  Heart Rate: 85 (10/23/21 1539)  BP (Non-Invasive): (!) 202/90 (10/23/21 1539)  Respiratory Rate: (!) 22 (10/23/21 1539)  SpO2: 100 % (10/23/21 1539)    No notable events documented.    Patient is sufficiently recovered from the effects of anesthesia to participate in the evaluation and has returned to their pre-procedure level.           Anesthetic complications: no

## 2021-10-24 NOTE — OR Surgeon (Addendum)
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 R side periprosthetic complete capsulectomy (19371)  2. R side breast intact implant removal (included)  3. L side periprosthetic complete capsulectomy (19371)  4. L side breast intact implant removal (included)    Attending Surgeon: Redgie Grayer MD  Assistant(s): Everitt Amber MD, Baxter Flattery MD    Indications for Procedure:  Rachel [name] is a 80 y.o. female who presents today for we removal of her bilateral implants with complete capsulectomy.  She has severe pain and Baker grade 4 capsular contracture.  It is medically necessary to excise these implants and the surrounding capsules. Her current implants were placed in 1994, and she previously had a first pair of implants placed in 1974.  The current implants are saline implants, presumably over the muscle.    She understands the risks, benefits and alternatives to surgery. She understands the serious nature of the procedure and the limitations what can be achieved.. She is aware the risks are inclusive, but not exclusive, of infection, hematoma, bleeding, scarring, loss of skin flaps, wound healing problems,seroma and the possible need for revision surgeries.    Anesthesia Type:General  Estimated Blood Loss:Minimal  Blood Given:none  Fluids Given:per anesthesia  Complications (not routinely expected or not inherent to difficulty/nature of procedure):none  Characteristic Event (routinely expected or inherent to the difficulty/nature of the procedure):none  Did the use of current and/or prior Anticoagulants impact the outcome of the case?N\A  Wound Class:Clean Wound: Uninfected operative  wounds in which no inflammation occurred    Details of Procedure:  After general anesthesia was obtained,the patient was prepped and draped normal sterile fashion. Attention wasthenturned towards the left breast.An incisionwas made along the inferior aspect of the breast, just above theinframammary fold, and was carefully incised. Dissection was carried down to the level of the capsule. A complete capsulectomy was performed with the implant encased in the capsule. The implantand capsule were carefully explanted. Care was taken to avoid thin the tissue too much to maintain viability.  The wound  was cleansed with pulse lavage  Blake drain with attached trocar was then placed.  Blake drain was secured to the skin with nylon suture and trocar removed.  The area was infiltrated with Exparel and Marcaine for postoperative pain control.  The incision was closed with Monocryl suture.     Attention wasthenturned towards the right breast.A matching incisionwas made along the inferior aspect of the breast, just above theinframammary fold, and was carefully incised.  Dissection was carried down to the level of the capsule. A complete capsulectomy was performed with the implant encased in the capsule. The implantand capsule were carefully explanted. Care was taken to avoid thin the tissue too much to maintain viability.  The wound  was cleansed with pulse lavage  Blake drain with attached trocar was then placed.  Blake drain was secured to the skin with nylon suture and trocar removed.  The area was infiltrated with Exparel and Marcaine for postoperative pain control.  The incision was closed with Monocryl suture.     The incisions were dressed with Xeroform followed by dry sterile fluff dressings and ace wraps were applied. She awoke without complication. She was admitted to the recovery room in stable condition.    Tubes:None  Drains:Jackson Kennon Rounds Drainx 2  Specimens/ Cultures:sent for pathology  bilateral capsules completely covering breast implants  Implants:none    Disposition:PACU - hemodynamically stable.  Condition:stable    Redgie Grayer, MD  10/24/2021, 14:56

## 2021-10-26 ENCOUNTER — Other Ambulatory Visit: Payer: Self-pay

## 2021-10-26 ENCOUNTER — Ambulatory Visit (INDEPENDENT_AMBULATORY_CARE_PROVIDER_SITE_OTHER): Payer: Self-pay | Admitting: Hand Surgery

## 2021-10-26 ENCOUNTER — Encounter (INDEPENDENT_AMBULATORY_CARE_PROVIDER_SITE_OTHER): Payer: Self-pay | Admitting: Family

## 2021-10-26 ENCOUNTER — Ambulatory Visit: Payer: Medicare Other | Attending: Hand Surgery | Admitting: Family

## 2021-10-26 VITALS — BP 144/68 | HR 76 | Temp 97.2°F

## 2021-10-26 DIAGNOSIS — Z9886 Personal history of breast implant removal: Secondary | ICD-10-CM | POA: Insufficient documentation

## 2021-10-26 DIAGNOSIS — Z4803 Encounter for change or removal of drains: Secondary | ICD-10-CM

## 2021-10-26 NOTE — Progress Notes (Signed)
[facility]  [place] [address]  Operated by [facility]  Progress Note    Name: [name] MRN:  [id number]   Date: 10/26/2021 Age: 80 y.o.       Chief Complaint: Other (Drain check )    Subjective:   80 year old female presents to the clinic today accompanied by her friend. The patient is 3 days postop from bilateralperiprostheticcomplete capsulectomy and breastintactimplant removal. Bilateral JP drains were placed. Today she reports 7/10 pain. Patient states that she experienced zero pain prior to this morning. The pain is located in her right side, around her JP site. The patients JP drain output has been less than 30cc in 24 hours. Patient reports that upon discharge from same day surgery she was wrapped in an ACE wrap for compression, after arriving home the ACE wrap fell and she was not able to wrap herself again. She has not been wearing a compression bra.      Review of Systems   Constitutional: Negative for chills and fever.   HENT: Negative.    Eyes: Negative.    Cardiovascular: Negative for chest pain.   Respiratory: Negative for cough and shortness of breath.    Hematologic/Lymphatic: Negative for bleeding problem.   Skin: Negative.    Musculoskeletal: Negative.    Gastrointestinal: Negative.    Genitourinary: Negative.    Neurological: Negative for dizziness and headaches.       Objective :  BP (!) 144/68   Pulse 76   Temp 36.2 C (97.2 F)   SpO2 99%        Physical Exam  Constitutional:       Appearance: Normal appearance.   HENT:      Head: Normocephalic.   Eyes:      Pupils: Pupils are equal, round, and reactive to light.   Cardiovascular:      Pulses: Normal pulses.      Heart sounds: Normal heart sounds.   Pulmonary:      Effort: Pulmonary effort is normal.      Breath sounds: Normal breath sounds.   Abdominal:      General: Abdomen is flat.      Palpations: Abdomen is soft.   Musculoskeletal:         General: Normal range  of motion.   Skin:     General: Skin is warm.      Comments: Bilateral incisions intact.    Neurological:      General: No focal deficit present.      Mental Status: She is alert and oriented to person, place, and time. Mental status is at baseline.   Psychiatric:         Mood and Affect: Mood normal.         Data reviewed:    Current Outpatient Medications   Medication Sig   . acetaminophen-codeine (TYLENOL #3) 300-30 mg Oral Tablet Take 1 Tablet by mouth Every 4 hours as needed for up to 3 days   . busPIRone (BUSPAR) 5 mg Oral Tablet Take 1 Tablet (5 mg total) by mouth in the morning and 1 Tablet (5 mg total) at noon and 1 Tablet (5 mg total) before bedtime. Do all this for 30 days.   . carboxymethylcellulose sodium (ARTIFICIAL TEARS, CMC, OPHT) Administer into affected eye(s) Twice daily   . cephalexin (KEFLEX) 500 mg Oral Capsule Take 1 Capsule (500 mg total) by mouth Four times a  day for 7 days   . diclofenac sodium (VOLTAREN) 1 % Gel Apply topically Three times a day   . DULoxetine (CYMBALTA DR) 60 mg Oral Capsule, Delayed Release(E.C.) Take 60 mg by mouth Once a day   . estrogen,con/m-progest acet (PREMPRO ORAL) Take by mouth Every morning   . fexofenadine HCl (ALLEGRA ORAL) Take 180 mg by mouth Every morning   . guaiFENesin (MUCINEX) 600 mg Oral Tablet Extended Release 12hr Take 2 Tablets (1,200 mg total) by mouth Twice daily   . METHOCARBAMOL ORAL Take 500 mg by mouth Every morning   . mometasone furoate (NASONEX NASL) Administer into affected nostril(s) Twice daily   . Non-Adherent Bandage (TELFA) 3 X 8 " Bandage Apply Vaseline to wounds. Then, apply Telfa pad. Secure with gauze   . Oxycodone (ROXICODONE) 10 mg Oral Tablet Take 1 Tablet (10 mg total) by mouth Every 6 hours as needed for Pain for up to 30 days Indications: osteoarthritis   . propranoloL (INDERAL LA) 60 mg Oral Capsule,Sustained Action 24 hr Take 1 Capsule (60 mg total) by mouth Once a day for 30 days   . rOPINIRole (REQUIP) 4 mg Oral Tablet  Take 1 Tablet (4 mg total) by mouth in the morning and 1 Tablet (4 mg total) before bedtime. Do all this for 30 days.     Assessment/Plan  Problem List Items Addressed This Visit    None  Visit Diagnoses     Status post breast implant removal    -  Primary        - Incision sites are clean, well approximated, and healing well.   - Bilateral JP's removed.  - Supportive bra given to patient, and educated patient on importance of support and compression during post operative period.   - Encouraged patient to continue to use PRN medication of acetaminophen-codeine and to alternate with ibuprofen for pain.   - Plan to follow up 11/01/2021.      Jeremy Johann, FNP-C  10/26/2021, 15:06

## 2021-10-26 NOTE — Telephone Encounter (Signed)
Contacted the patient's husband, Carlis Abbott on his mobile phone.  He stated that Rachel [name] is having some pain.  He stated he feels that the drain sites need to be covered.  He stated that the dressings were messed up.  Rachel [name] stated that she wasn't placed in a bra, therefore he tried to find one for her and now he can't seem to get the dressing back on correctly and to get the drains from pulling that are causing her pain.  Rachel [name] asked if someone could please see Rachel [name] today to help them out.  Notified him we would absolutely see them in clinic.  Rachel [name] stated they could be here in 15 minutes.  Notified Rachel [name] I will make them an appointment and we will see them soon.  Rachel [name] thanked me and verbalized understanding.  Advised them to contact the Plastics office with any other questions or concerns.    Kris Mouton, RN  10/26/2021, 13:49

## 2021-10-26 NOTE — Telephone Encounter (Signed)
Pts husband is calling about this again. He says he needs a call back asap because drains do not look good. Please call pts husband. Thank you!     ----- Message from Venia Minks sent at 10/26/2021 12:30 PM EDT -----   Rachel [name] pt     Pt had surgery yesterday and is having issues with the wraps. Please call her back.     Thank you                 Call History     Type Contact Phone/Fax User   10/26/2021 12:29 PM EDT Phone (Incoming) Rachel, [name] (Self) [phone] Jerilynn Mages) Venia Minks   10/26/2021 12:29 PM EDT Phone (Incoming)   Venia Minks         Attempted to contact the patient and received her voicemail.  Patient's mailbox was full and was unable to leave a message.  Notified the call room I tried to call the patient and patient didn't answer.    Kris Mouton, RN  10/26/2021, 13:37

## 2021-10-31 ENCOUNTER — Other Ambulatory Visit (INDEPENDENT_AMBULATORY_CARE_PROVIDER_SITE_OTHER): Payer: Self-pay | Admitting: Student in an Organized Health Care Education/Training Program

## 2021-10-31 DIAGNOSIS — Z45811 Encounter for adjustment or removal of right breast implant: Secondary | ICD-10-CM

## 2021-10-31 DIAGNOSIS — H547 Unspecified visual loss: Secondary | ICD-10-CM

## 2021-10-31 DIAGNOSIS — Z45812 Encounter for adjustment or removal of left breast implant: Secondary | ICD-10-CM

## 2021-10-31 LAB — SURGICAL PATHOLOGY SPECIMEN

## 2021-11-01 ENCOUNTER — Ambulatory Visit: Payer: Medicare Other | Attending: Hand Surgery | Admitting: Physician Assistant

## 2021-11-01 ENCOUNTER — Ambulatory Visit (INDEPENDENT_AMBULATORY_CARE_PROVIDER_SITE_OTHER): Payer: Medicare Other | Admitting: Counselor

## 2021-11-01 ENCOUNTER — Other Ambulatory Visit: Payer: Self-pay

## 2021-11-01 ENCOUNTER — Encounter (INDEPENDENT_AMBULATORY_CARE_PROVIDER_SITE_OTHER): Payer: Self-pay | Admitting: Physician Assistant

## 2021-11-01 VITALS — BP 139/82 | HR 93 | Temp 98.8°F | Ht 64.0 in | Wt 101.0 lb

## 2021-11-01 DIAGNOSIS — Z9886 Personal history of breast implant removal: Secondary | ICD-10-CM

## 2021-11-01 NOTE — Progress Notes (Signed)
[facility]  [name] [address]  Operated by [facility]       Name: Rachel [name] MRN:  [id number]   Date: 11/01/21 Age: 80 y.o.         POST-OP VISIT     HPI: Rachel [name] is a 80 y.o. Unknown female who is presenting for a post-op visit. She is S/P removal of bilateral breast implants on 10/23/2021. Since her last office visit on 10/26/2021, she has had no changes in health.  Doing well today.  Denies fevers, chills, nausea and vomiting.       REVIEW OF SYSTEMS  + as per HPI.     PHYSICAL EXAM  Vitals: BP 139/82   Pulse 93   Temp 37.1 C (98.8 F)   Ht 1.626 m (5\' 4" )   Wt 45.8 kg (100 lb 15.5 oz)   SpO2 97%   BMI 17.33 kg/m        General: alert, no distress  Head: normocephalic and atraumatic  Eyes:  Conjunctiva clear  ENT:  ENMT without erythema or injection  Respiratory:  Non-labored respirations  Chest: Bilateral breast flaps well-perfused, incisions c/d/i, no surrounding erythema, no drainage, non-tender, no palpable collections  Neuro: alert and oriented x 3  Vascular: well perfused   Skin: warm and dry    PATHOLOGY:   Final Diagnosis   A. BREAST, RIGHT IMPLANT AND CAPSULE, CAPSULECTOMY AND REMOVAL:   - Fragments of benign fibrous and fibroadipose tissue with marked dystrophic calcification.  - Foreign body consistent with breast implant (gross examination only).    B. BREAST,  LEFT IMPLANT AND CAPSULE, CAPSULECTOMY AND REMOVAL:   - Fragments of benign fibrous and fibroadipose tissue with marked dystrophic calcification.  - Foreign body consistent with breast implant (gross examination only).           ASSESSMENT:  80 y.o. S/P removal of bilateral breast implants on 10/23/2021.    PLAN:    - Continue compression bra  - Ok to shower, no submerging in any body of water  - Limit upper extremity activity  - Educated on s/s of infection  - Follow-up with Dr. Clarise Cruz in two weeks  - Instructed to call with any questions or concerns in the  interim     Patient was seen independently (with female chaperone) with faculty available for consultation.  The patient was given the chance to ask all of their questions, and these were answered to their satisfaction.      Marquis Buggy, PA-C

## 2021-11-03 ENCOUNTER — Other Ambulatory Visit: Payer: Self-pay

## 2021-11-03 ENCOUNTER — Encounter (INDEPENDENT_AMBULATORY_CARE_PROVIDER_SITE_OTHER): Payer: Self-pay | Admitting: Student in an Organized Health Care Education/Training Program

## 2021-11-03 ENCOUNTER — Inpatient Hospital Stay (HOSPITAL_BASED_OUTPATIENT_CLINIC_OR_DEPARTMENT_OTHER)
Admission: RE | Admit: 2021-11-03 | Discharge: 2021-11-03 | Disposition: A | Discharge status: Home / Self Care | Payer: Medicare Other | Source: Ambulatory Visit

## 2021-11-03 ENCOUNTER — Ambulatory Visit
Payer: Medicare Other | Attending: Cornea and External Diseases Specialist | Admitting: Student in an Organized Health Care Education/Training Program

## 2021-11-03 ENCOUNTER — Ambulatory Visit (HOSPITAL_BASED_OUTPATIENT_CLINIC_OR_DEPARTMENT_OTHER)
Admission: RE | Admit: 2021-11-03 | Discharge: 2021-11-03 | Disposition: A | Discharge status: Home / Self Care | Payer: Medicare Other | Source: Ambulatory Visit

## 2021-11-03 DIAGNOSIS — H5347 Heteronymous bilateral field defects: Secondary | ICD-10-CM | POA: Insufficient documentation

## 2021-11-03 DIAGNOSIS — H47019 Ischemic optic neuropathy, unspecified eye: Secondary | ICD-10-CM

## 2021-11-03 DIAGNOSIS — H547 Unspecified visual loss: Secondary | ICD-10-CM

## 2021-11-03 DIAGNOSIS — H35363 Drusen (degenerative) of macula, bilateral: Secondary | ICD-10-CM

## 2021-11-03 DIAGNOSIS — Z961 Presence of intraocular lens: Secondary | ICD-10-CM

## 2021-11-03 DIAGNOSIS — H538 Other visual disturbances: Secondary | ICD-10-CM

## 2021-11-03 DIAGNOSIS — H04123 Dry eye syndrome of bilateral lacrimal glands: Secondary | ICD-10-CM

## 2021-11-03 DIAGNOSIS — H04129 Dry eye syndrome of unspecified lacrimal gland: Secondary | ICD-10-CM | POA: Insufficient documentation

## 2021-11-03 DIAGNOSIS — H40033 Anatomical narrow angle, bilateral: Secondary | ICD-10-CM | POA: Insufficient documentation

## 2021-11-03 NOTE — Addendum Note (Signed)
Addended by: Cannon Kettle on: 11/03/2021 06:16 PM     Modules accepted: Orders

## 2021-11-03 NOTE — Progress Notes (Addendum)
[name]  [place] [address]  Operated by [facility]         Patient Name: Rachel [name]  MRN#: [id number]  Birthdate: 12-14-1941    Date of Service: 11/03/2021    Chief Complaint    Follow Up Intraocular lens (iol)         AAYUSHI SOLORZANO is a 80 y.o. female who presents today for evaluation/consultation of:  HPI    Patient states she is here for 1 month follow up IOL was to get a VF today but this didn't happen.   Vision is not great in the ditacne with reading signs after the cataract surgery.   Last edited by Delfin Gant, COMT on 11/03/2021  3:18 PM.        ROS    Positive for: Eyes  Negative for: Constitutional, Gastrointestinal, Neurological, Skin, Genitourinary, Musculoskeletal, HENT, Endocrine, Cardiovascular, Respiratory, Psychiatric, Allergic/Imm, Heme/Lymph  Last edited by Delfin Gant, COMT on 11/03/2021  3:18 PM.         All other systems Negative    Erin E Wotring, COMT  11/03/2021, 15:21  Base Eye Exam       Visual Acuity (Snellen - Linear)         Right Left    Dist cc 20/30 +2 20/20      Correction: Glasses              Tonometry (Tonopen, 4:30 PM)         Right Left    Pressure 15 16              Pupils         APD    Right None    Left None              Visual Fields (Counting fingers)         Right Left     Full Full              Extraocular Movement         Right Left     Full, Ortho Full, Ortho              Neuro/Psych       Oriented x3: Yes    Mood/Affect: Normal              Dilation       Both eyes: 2.5% Phenylephrine, 1.0% Mydriacyl @ 4:30 PM                  Slit Lamp and Fundus Exam       External Exam         Right Left    External Normal Normal              Slit Lamp Exam         Right Left    Lids/Lashes Normal Normal    Conjunctiva/Sclera White and quiet White and quiet    Cornea Clear Clear    Anterior Chamber Deep and quiet Deep and quiet    Iris Round and reactive Round and reactive    Lens multifocal toric, open  PC multifocal toric, open PC    Vitreous pvd Normal              Fundus Exam         Right Left    Disc Normal Normal    C/D Ratio  0.3 0.3    Macula rare drusen rare drusen    Vessels Normal Normal    Periphery extramacular drusen extramacular drusen                  Refraction       Wearing Rx         Sphere Cylinder Axis Add    Right [place] +0.50 120 +2.75    Left -0.25 +1.25 175 +2.75      Type: PAL                    MD Addition to HPI: Pt states that ever since cataract surgery, her vision has been blurry. States that it seems like it didn't help her vision at all. Had CE in [place] and states that her eye doctor down there died. Denies any double vision at this time. States she used to have trouble in the past. Feels that her vision is okay in the AM but then around 10am she feels that it gets worse. She wonders if it has any correlation to her AM medications but states they have not changed in a long time so isn't sure why they would be causing trouble now. She notes that she has a lot of trouble with glare, and that it helps when she puts her sunglasses on. Uses Restasis BID and ATs. Never has had plugs. States that she has had prism in her glasses in the past. Does note that her glasses help her see a little better.           ENCOUNTER DIAGNOSES     ICD-10-CM   1. 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     Orders Placed This Encounter   Procedures    OPH RNFL BI    OPH OCT BI       Ophthalmic Plan of Care:  Pseudophakic OU  -symphony toric OD (at 10 degrees per chart review), technis multifocal toric OS (at 4 degrees per chart review)  -S/p YAG OU  -baseline HVF with central nonspecific depression pts today  -RNFL and GCL wnl  -OCT mac wnl  -toric lenses seem to be at appropriate axis however OS centration of lens seems slightly displaced from visual axis  -paper records from [place] office copied and plan for scan into EPIC chart (including pre-op pentacam, IOL master)    -post op MRx with significant cylinder OS still  -pt educated that she may need a lens exchange in the future if unable to improve glare sxs    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 I discussed the above diagnoses listed in the assessment and the above ophthalmic plan of care with the patient and patient's family. All questions were answered. I reviewed and, when necessary, made changes to the technician/resident note, documented ophthalmology exam, chief complaint, history of present illness, allergies, review of  systems, past medical, past surgical, family and social history. I personally reviewed and interpreted all testing and/or imaging performed at this visit and agree with the resident's or fellow's interpretation. Any exceptions/additions are edited/noted in the relevant encounter fields.          I saw and examined the patient.  I reviewed the resident's note.  I agree with the findings and plan of care as documented in the resident's note.  Any exceptions/additions are edited/noted.    Lillard Anes, MD

## 2021-11-03 NOTE — Procedures (Signed)
[name]  [place] [address]  Operated by [place]  Procedure Note    Name: [name] MRN:  [id number]   Date: 11/03/2021 Age: 80 y.o.       [phone] - CLOSURE OF THE LACRIMAL PUNCTUM BY PLUG, EACH (AMB ONLY)    Date/Time: 11/03/2021 6:13 PM  Performed by: Cannon Kettle, MD  Authorized by: Lillard Anes, MD     Documentation:      A drop of proparacaine to left eye.    Ultraplug 0.11mm plug placed in lower punctum.  No excessive tightness.  Plug sitting within punctum.    Eye Supplies: Punctal plug  Size: 0.2  Eyelid Site #1: LLL  Eyelid Site #2: RLL      A drop of proparacaine to right eye.    Ultraplug 0.46mm plug placed in lower punctum.  No excessive tightness.  Plug sitting within punctum.    The patient tolerated the procedure well and was discharged home.          Cannon Kettle, MD    I was present and supervised/observed the entire procedure.  Lillard Anes, MD

## 2021-11-08 ENCOUNTER — Other Ambulatory Visit: Payer: Self-pay

## 2021-11-08 ENCOUNTER — Encounter (HOSPITAL_BASED_OUTPATIENT_CLINIC_OR_DEPARTMENT_OTHER): Payer: Self-pay | Admitting: Family

## 2021-11-08 ENCOUNTER — Other Ambulatory Visit (HOSPITAL_BASED_OUTPATIENT_CLINIC_OR_DEPARTMENT_OTHER): Payer: Self-pay | Admitting: Family

## 2021-11-08 ENCOUNTER — Ambulatory Visit: Payer: Medicare Other | Attending: Family | Admitting: Family

## 2021-11-08 VITALS — BP 140/64 | HR 107 | Temp 97.7°F | Ht 63.39 in | Wt 105.4 lb

## 2021-11-08 DIAGNOSIS — M1990 Unspecified osteoarthritis, unspecified site: Secondary | ICD-10-CM | POA: Insufficient documentation

## 2021-11-08 DIAGNOSIS — G2581 Restless legs syndrome: Secondary | ICD-10-CM | POA: Insufficient documentation

## 2021-11-08 DIAGNOSIS — Z9189 Other specified personal risk factors, not elsewhere classified: Secondary | ICD-10-CM | POA: Insufficient documentation

## 2021-11-08 MED ORDER — DULOXETINE 60 MG CAPSULE,DELAYED RELEASE
60.0000 mg | DELAYED_RELEASE_CAPSULE | Freq: Every day | ORAL | 3 refills | Status: DC
Start: 2021-11-08 — End: 2022-03-21

## 2021-11-08 MED ORDER — PROPRANOLOL ER 60 MG CAPSULE,24 HR,EXTENDED RELEASE
60.0000 mg | ORAL_CAPSULE | Freq: Every day | ORAL | 0 refills | Status: DC
Start: 2021-11-08 — End: 2021-11-08

## 2021-11-08 MED ORDER — DICLOFENAC 1 % TOPICAL GEL
Freq: Three times a day (TID) | CUTANEOUS | 0 refills | Status: DC
Start: 2021-11-08 — End: 2021-11-08

## 2021-11-08 MED ORDER — DICLOFENAC 1 % TOPICAL GEL
Freq: Three times a day (TID) | CUTANEOUS | 0 refills | Status: DC
Start: 2021-11-08 — End: 2022-03-21

## 2021-11-08 MED ORDER — PROPRANOLOL ER 60 MG CAPSULE,24 HR,EXTENDED RELEASE
60.0000 mg | ORAL_CAPSULE | Freq: Every day | ORAL | 0 refills | Status: DC
Start: 2021-11-08 — End: 2021-11-13

## 2021-11-08 MED ORDER — DULOXETINE 60 MG CAPSULE,DELAYED RELEASE
60.0000 mg | DELAYED_RELEASE_CAPSULE | Freq: Every day | ORAL | 3 refills | Status: DC
Start: 2021-11-08 — End: 2021-11-08

## 2021-11-08 MED ORDER — DEXILANT 60 MG CAPSULE, DELAYED RELEASE
60.0000 mg | DELAYED_RELEASE_CAPSULE | Freq: Every day | ORAL | 3 refills | Status: DC
Start: 2021-11-08 — End: 2021-11-08

## 2021-11-08 MED ORDER — DEXILANT 60 MG CAPSULE, DELAYED RELEASE
60.0000 mg | DELAYED_RELEASE_CAPSULE | Freq: Every day | ORAL | 3 refills | Status: DC
Start: 2021-11-08 — End: 2022-03-21

## 2021-11-08 NOTE — Addendum Note (Signed)
Addendum  created 11/08/21 1632 by Docia Barrier, MD    Attestation recorded in [place], [place] filed

## 2021-11-08 NOTE — Telephone Encounter (Signed)
Not taking

## 2021-11-08 NOTE — Progress Notes (Signed)
INTERNAL MEDICINE   [facility] [place]  Return Visit Progress Note     Name: Rachel [name] Date of Service: 11/08/2021   MRN:  [id number]  Age/DOB: 80 y.o., 23-Nov-1941 PCP:  [name], MD  Reason for Visit: Follow Up     Subjective:      Rachel [name] is a 80 y.o. female with PMH of hypoglycemic episodes, chronic pain due to OA, migraine and recurrent sinus infections who presents to the clinic for follow up. Patient was last seen in clinic on 10/03/21. Patient had breast implants removed at the end of October. She states she is sore, but doing well.   Breathing has improved. She denies any shortness of breath or chest pain. He mood is much improved since adding in Buspar. She denies any issues at this time with RLS. Feels that she is getting restful sleep.     Taking mediations, but states she is confused on them. She is unsure when she is supposed to be taking medications and what they're for. She has a pill box at home. She is working to [employer]. She would be willing to have medications pre-packaged. She is agreeable to coming in to clinic on Monday, 11/14/21 for medication reconciliation and consultation with [facility] Pharmacist.       Medical History:      Past Medical History, Allergies, Surgical History, Family, and Social History were reviewed  on   11/08/2021 and updated as needed.     MEDICATIONS:  Outpatient Medications Marked as Taking for the 11/08/21 encounter (Office Visit) with Gwendolyn Lima, APRN,FNP-BC   Medication Sig    carboxymethylcellulose sodium (ARTIFICIAL TEARS, CMC, OPHT) Administer into affected eye(s) Twice daily    DEXILANT 60 mg Oral Cap, Delayed Rel., Multiphasic Take 1 Capsule (60 mg total) by mouth Once a day    diclofenac sodium (VOLTAREN) 1 % Gel Apply topically Three times a day    DULoxetine (CYMBALTA DR) 60 mg Oral Capsule, Delayed Release(E.C.) Take 1 Capsule (60 mg total) by mouth Once a day    estrogen,con/m-progest acet (PREMPRO ORAL)  Take by mouth Every morning    fexofenadine HCl (ALLEGRA ORAL) Take 180 mg by mouth Every morning    Fexofenadine-Pseudoephedrine 180-240 mg Oral Tablet Sustained Release 24 hr Take 1 Tablet by mouth Once a day    guaiFENesin (MUCINEX) 600 mg Oral Tablet Extended Release 12hr Take 2 Tablets (1,200 mg total) by mouth Twice daily    METHOCARBAMOL ORAL Take 500 mg by mouth Every morning    mometasone furoate (NASONEX NASL) Administer into affected nostril(s) Twice daily    Oxycodone (ROXICODONE) 10 mg Oral Tablet Take 1 Tablet (10 mg total) by mouth Every 6 hours as needed for Pain for up to 30 days Indications: osteoarthritis    propranoloL (INDERAL LA) 60 mg Oral Capsule,Sustained Action 24 hr Take 1 Capsule (60 mg total) by mouth Once a day for 30 days     Physical Exam:     Vitals:   height is 1.61 m (5' 3.39") and weight is 47.8 kg (105 lb 6.1 oz). Her temperature is 36.5 C (97.7 F). Her blood pressure is 140/64 (abnormal) and her pulse is 107 (abnormal). Her oxygen saturation is 97%.      Physical Exam  Constitutional:       General: She is not in acute distress.     Appearance: Normal appearance. She is not ill-appearing.   HENT:  Head: Normocephalic and atraumatic.   Cardiovascular:      Rate and Rhythm: Normal rate and regular rhythm.      Pulses: Normal pulses.      Heart sounds: Normal heart sounds.   Pulmonary:      Effort: Pulmonary effort is normal.      Breath sounds: Normal breath sounds. No wheezing.   Abdominal:      General: Bowel sounds are normal.      Palpations: Abdomen is soft.      Tenderness: There is no abdominal tenderness.   Musculoskeletal:         General: Normal range of motion.   Skin:     General: Skin is warm and dry.      Capillary Refill: Capillary refill takes less than 2 seconds.   Neurological:      General: No focal deficit present.      Mental Status: She is alert and oriented to person, place, and time.   Psychiatric:         Mood and Affect: Mood normal.          Behavior: Behavior normal.          Data Reviewed and Interpretation:     N/A     Assessment:     Rachel [name] is a 80 y.o. female with PMH of hypoglycemic episodes, chronic pain due to OA, migraine and recurrent sinus infections who presents to the clinic for follow up.     Plan:     (G25.81) RLS (restless legs syndrome)  (primary encounter diagnosis)  -Continue Requip 4 mg BID    (Z91.89) At risk for polypharmacy  -Will see pharmacy staff on Monday, 11/14/21 for medication reconciliation  -Advised patient to bring all medications and pill box with her.    (M19.90) Osteoarthritis, unspecified osteoarthritis type, unspecified site  Plan: DULoxetine (CYMBALTA DR) 60 mg Oral Capsule,         Delayed Release(E.C.), DISCONTINUED: DULoxetine        (CYMBALTA DR) 60 mg Oral Capsule, Delayed         Release(E.C.)  -Medications Refilled       Follow Up:       Return to clinic Monday for Medication Reconciliation   Labs and Imaging Needing Follow Up:  N/A  Goals for Next Visit:  F/U Medications    Patient had no questions regarding treatment plan, goals, risks or benefits and agrees to contact me or my clinic in the interim should any questions or problems arise.    Gwendolyn Lima, APRN,FNP-BC  Internal Medicine  [place]         The patient was seen independently.      On the day of the encounter, a total of  33 minutes was spent on this patient encounter including review of historical information, examination, documentation and post-visit activities.

## 2021-11-13 ENCOUNTER — Other Ambulatory Visit: Payer: Self-pay

## 2021-11-13 ENCOUNTER — Other Ambulatory Visit (HOSPITAL_BASED_OUTPATIENT_CLINIC_OR_DEPARTMENT_OTHER): Payer: Self-pay | Admitting: Internal Medicine

## 2021-11-13 ENCOUNTER — Ambulatory Visit: Payer: Medicare Other | Attending: Ambulatory Care | Admitting: Ambulatory Care

## 2021-11-13 DIAGNOSIS — Z79899 Other long term (current) drug therapy: Secondary | ICD-10-CM | POA: Insufficient documentation

## 2021-11-13 DIAGNOSIS — Z7689 Persons encountering health services in other specified circumstances: Secondary | ICD-10-CM

## 2021-11-13 DIAGNOSIS — G43909 Migraine, unspecified, not intractable, without status migrainosus: Secondary | ICD-10-CM

## 2021-11-13 DIAGNOSIS — N898 Other specified noninflammatory disorders of vagina: Secondary | ICD-10-CM

## 2021-11-13 DIAGNOSIS — G2581 Restless legs syndrome: Secondary | ICD-10-CM

## 2021-11-13 MED ORDER — PROPRANOLOL 60 MG TABLET
60.0000 mg | ORAL_TABLET | Freq: Every day | ORAL | 0 refills | Status: DC
Start: 2021-11-13 — End: 2021-12-21

## 2021-11-13 MED ORDER — CONJUGATED ESTROGENS 0.625 MG/GRAM VAGINAL CREAM
0.5000 g | TOPICAL_CREAM | VAGINAL | 0 refills | Status: DC
Start: 2021-11-13 — End: 2021-12-21

## 2021-11-13 MED ORDER — ROPINIROLE 4 MG TABLET
4.0000 mg | ORAL_TABLET | Freq: Two times a day (BID) | ORAL | 0 refills | Status: DC
Start: 2021-11-13 — End: 2021-12-21

## 2021-11-13 NOTE — Patient Instructions (Signed)
Medication List for:  Rachel [name]  Date: 11/13/2021     Please check this list CAREFULLY to be sure it's correct!  Current Outpatient Medications    Medication Sig Dispense Refill Notes    carboxymethylcellulose sodium (ARTIFICIAL TEARS, CMC, OPHT) Administer into affected eye(s) Twice daily       DEXILANT 60 mg Oral Cap, Delayed Rel., Multiphasic Take 1 Capsule (60 mg total) by mouth Once a day 90 Capsule 3     diclofenac sodium (VOLTAREN) 1 % Gel Apply topically Three times a day 450 g 0 Use as needed    DULoxetine (CYMBALTA DR) 60 mg Oral Capsule, Delayed Release(E.C.) Take 1 Capsule (60 mg total) by mouth Once a day 90 Capsule 3     estrogen,con/m-progest acet (PREMPRO ORAL) Take by mouth Every morning       fexofenadine HCl (ALLEGRA ORAL) Take 180 mg by mouth Every morning       Fexofenadine-Pseudoephedrine 180-240 mg Oral Tablet Sustained Release 24 hr Take 1 Tablet by mouth Once a day       guaiFENesin (MUCINEX) 600 mg Oral Tablet Extended Release 12hr Take 2 Tablets (1,200 mg total) by mouth Twice daily       METHOCARBAMOL ORAL Take 500 mg by mouth Every morning   Can try taking 1/2 tablet daily    mometasone furoate (NASONEX NASL) Administer into affected nostril(s) Twice daily       Non-Adherent Bandage (TELFA) 3 X 8 " Bandage Apply Vaseline to wounds. Then, apply Telfa pad. Secure with gauze (Patient not taking: Reported on 11/08/2021) 144 Each 3     Oxycodone (ROXICODONE) 10 mg Oral Tablet Take 1 Tablet (10 mg total) by mouth Every 6 hours as needed for Pain: osteoarthritis 120 Tablet 0     propranoloL (INDERAL LA) 60 mg Oral Capsule,Sustained Action 24 hr Take 1 Capsule (60 mg total) by mouth Once a day for 30 days 30 Capsule 0     rOPINIRole (REQUIP) 4 mg Oral Tablet Take 1 Tablet (4 mg total) by mouth in the morning and 1 Tablet (4 mg total) before bedtime.  60 Tablet 0      No current facility-administered medications for this visit.       Follow up:  - Prempro: assess need and if needed, change  to generic alternative  - Ropinirole: needs new prescription, will inform your PCP  - Myrbetriq: used to take, but made symptoms worse. Will inform your PCP for further evaluation  - Propranolol: used to be immediate release, but has been extended release for the past 2 months. Will discuss with PCP if there is a preference  - Duloxetine: used for mood and nerve pain. Mentioned that you would like to decrease dose to 30 mg. Will inform PCP for further discussion and evaluation

## 2021-11-13 NOTE — Progress Notes (Signed)
Internal Medicine - Medical Group Practice  Pharmacy Note    Rachel [name] is a 80 y.o. female seen today in [facility] for medication reconciliation. She presents to clinic today Alone. Patient brought her bag of prescription and over-the-counter medications.    Subjective     Patient recently established care in [facility] after moving here from [place]. She lives here with her husband and manages her own medications. After meeting with NP recently, a med rec was requested to review and verify patient's current medications.      Medications     Medications were reviewed. Medication reconciliation was completed using Epic records and Prescription containers and verbal responses from patient. Patient/caregiver denies use of any other prescription, OTC, or herbal medications not listed in medication list below unless otherwise noted. Please see list for additional medication comments.    Reconciled Home Medication List  Current Outpatient Medications    Medication Sig Notes   . busPIRone (BUSPAR) 5 mg Oral Tablet Take 1 Tablet (5 mg total) by mouth in the morning and 1 Tablet (5 mg total) at noon and 1 Tablet (5 mg total) before bedtime. Do all this for 30 days. Patient no longer taking; it was prescribed for 30 days. She reports she did not notice improvement while taking and would prefer to not restart.   . carboxymethylcellulose sodium (ARTIFICIAL TEARS, CMC, OPHT) Administer into affected eye(s) Twice daily    . conjugated estrogens (PREMARIN) 0.625 mg/gram Vaginal Cream Insert 0.5 g into the vagina Every 7 days    . DEXILANT 60 mg Oral Cap, Delayed Rel., Multiphasic Take 1 Capsule (60 mg total) by mouth Once a day    . diclofenac sodium (VOLTAREN) 1 % Gel Apply topically Three times a day Doesn't notice much benefit from using.   . DULoxetine (CYMBALTA DR) 60 mg Oral Capsule, Delayed Release(E.C.) Take 1 Capsule (60 mg total) by mouth Once a day    . fexofenadine HCl (ALLEGRA ORAL) Take 180 mg by mouth Every morning  Counseled her to take either Allegra or Allegra-D, not both. She states she does not take them together.   Marland Kitchen Fexofenadine-Pseudoephedrine 180-240 mg Oral Tablet Sustained Release 24 hr Take 1 Tablet by mouth Once a day    . guaiFENesin (MUCINEX) 600 mg Oral Tablet Extended Release 12hr Take 2 Tablets (1,200 mg total) by mouth Twice daily Counseled to take with plenty of water.   Marland Kitchen METHOCARBAMOL ORAL Take 500 mg by mouth Every morning Patient reports feeling tired in the mornings as well as having falls and bumping into things. Will try taking 1/2 tablet.   . mometasone furoate (NASONEX NASL) Administer into affected nostril(s) Twice daily    . Non-Adherent Bandage (TELFA) 3 X 8 " Bandage Apply Vaseline to wounds. Then, apply Telfa pad. Secure with gauze (Patient not taking: Reported on 11/08/2021)    . Oxycodone (ROXICODONE) 10 mg Oral Tablet Take 1 Tablet (10 mg total) by mouth Every 6 hours as needed for Pain for up to 30 days Indications: osteoarthritis    . propranoloL (INDERAL) 60 mg Oral Tablet Take 1 Tablet (60 mg total) by mouth Once a day Indications: migraine prevention Patient had prescriptions for long acting and immediate release formulations.   Marland Kitchen rOPINIRole (REQUIP) 4 mg Oral Tablet Take 1 Tablet (4 mg total) by mouth Twice daily for 30 days Patient states this dose was the most beneficial, but was out of medication.       Medications Discontinued During  This Encounter   Medication Reason   . acetaminophen-codeine (TYLENOL #3) 300-30 mg Oral Tablet Alternate therapy   . cephalexin (KEFLEX) 500 mg Oral Capsule Therapy completed       Medication Use:  Medications are managed by: patient  Adherence device used: pillbox  Patient states medications are affordable: yes, but states they are cheaper at [facility] than [facility] and plans to switch pharmacies  How often do you miss doses of your medications? See notes above  Adequate supply of all medications? Patient requested refill of  ropinirole      Objective     Relevant Labs/Calculations:  Calculated CrCl = 36 mL/min (Actual body weight)      Assessment and Recommendations     1. Medication list is up-to-date and has been reconciled.    2. Methocarbamol: patient taking methocarbamol and oxycodone, both can cause sedation. She reports the methocarbamol helps with her shoulder pain. She also reports feeling drowsy in the mornings after taking. Recommend patient try taking 1/2 tablet (250 mg) to see if it helps symptoms with less side effects.  3. Prempro: patient reports that she has been taking this since she was 31. It was originally prescribed to help with symptoms of menopause including irritability and hot flashes. She does endorse having vaginal dryness. Recommend patient discuss with PCP if this is still needed or if she would benefit from a topical product instead. If she remains on the oral product, would like to consider lower cost alternatives to current prescription.  4. OAB: Patient used to take Myrbetriq but reports it made her symptoms worse. She endorses getting up ~4 times per night to urinate. Recommend patient discuss symptoms and possible pharmacologic or non-pharmacologic options with PCP.  5. Propranolol: patient had been taking 60 mg tablet daily for migraine prophylaxis. Most recent prescription was sent for 60 mg long acting capsule. Patient reports having taken both the capsule and tablet. Will discuss with PCP which is most appropriate. PCP recommended immediate release tablets.  6. Duloxetine: Patient concerned about dose of this medication and would like to consider decreasing. Explained it can be beneficial for mood and neuropathic pain. Will discuss further with PCP.  7. Buspirone: Patient was prescribed buspirone 5 mg three times daily. She took this for 30 days and reported no change. Prescription expired and had no refills. She would like to stay off of it at this time.    8. Medication Supplies - Patient  requested refill of ropinirole 4 mg twice daily. Last prescription was sent for 30 days with no refills. She reports this dose relieved her symptoms of restless leg syndrome and she has noticed symptoms again after running out of medication. PCP re-ordered today.    9. Health Maintenance  . Not addressed at today's visit.    Information obtained from this visit and recommendations noted above were discussed with provider(s) during patient's visit unless otherwise noted.   Will call to follow up with patient regarding assessment above.    Loraine Maple, PharmD, [place], [facility]  Ph: [phone]

## 2021-11-22 ENCOUNTER — Ambulatory Visit (INDEPENDENT_AMBULATORY_CARE_PROVIDER_SITE_OTHER): Payer: Self-pay | Admitting: Hand Surgery

## 2021-11-23 ENCOUNTER — Ambulatory Visit (INDEPENDENT_AMBULATORY_CARE_PROVIDER_SITE_OTHER): Payer: Medicare Other | Admitting: Counselor

## 2021-11-23 ENCOUNTER — Encounter (INDEPENDENT_AMBULATORY_CARE_PROVIDER_SITE_OTHER): Payer: Self-pay | Admitting: Counselor

## 2021-11-23 ENCOUNTER — Other Ambulatory Visit: Payer: Self-pay

## 2021-11-23 DIAGNOSIS — G43109 Migraine with aura, not intractable, without status migrainosus: Secondary | ICD-10-CM

## 2021-11-23 DIAGNOSIS — M47812 Spondylosis without myelopathy or radiculopathy, cervical region: Secondary | ICD-10-CM

## 2021-11-23 DIAGNOSIS — M79643 Pain in unspecified hand: Secondary | ICD-10-CM

## 2021-11-23 DIAGNOSIS — M5136 Other intervertebral disc degeneration, lumbar region: Secondary | ICD-10-CM

## 2021-11-23 DIAGNOSIS — M545 Low back pain, unspecified: Secondary | ICD-10-CM

## 2021-11-23 DIAGNOSIS — M25511 Pain in right shoulder: Secondary | ICD-10-CM

## 2021-11-23 DIAGNOSIS — M47816 Spondylosis without myelopathy or radiculopathy, lumbar region: Secondary | ICD-10-CM

## 2021-11-23 DIAGNOSIS — M5412 Radiculopathy, cervical region: Secondary | ICD-10-CM

## 2021-11-23 DIAGNOSIS — G8929 Other chronic pain: Secondary | ICD-10-CM

## 2021-11-23 DIAGNOSIS — M542 Cervicalgia: Secondary | ICD-10-CM

## 2021-11-23 DIAGNOSIS — M1990 Unspecified osteoarthritis, unspecified site: Secondary | ICD-10-CM

## 2021-11-23 DIAGNOSIS — M5416 Radiculopathy, lumbar region: Secondary | ICD-10-CM

## 2021-11-23 NOTE — Progress Notes (Signed)
[facility]            BEHAVIORAL MEDICINE, [facility]    Name: Rachel [name]  MRN: [id number]  Date of Birth: 01-Sep-1941  Date of Evaluation: 11/23/2021  Phone:   Home Phone [phone]   Work Phone [phone]   Mobile [phone]   Home Phone [phone]     Start Time:         1:00 pm                                  Stop Time: 2:10 pm    Information gathered from review of chart and patient interview.  Identification:   Patient is a 80 y.o. married Unknown female from [place] [address].  Patient was referred by Vergie Living, NP for Clinical Evaluation as a part of the Integrated Treatment Program.    Patient is being evaluated for appropriateness of long term opioids for management of chronic pain.       Chief Complaint:  Patient reports chronic LowerBack and neck, hands, head, right shoulder, thumbs, all due to arthritis and multiple falls as well as RLS .  Patient describes pain as Aching, Sharp, Shooting, Stabbing and Tingling  Patient is currently prescribed opioid medication.  Patient  had been prescribed opioid medications in the past.        [place] Pain Rating Scale     On a scale of 0-10, during the past 24 hours, pain has interfered with you usual activity:       On a scale of 0-10, during the past 24 hours, pain has interfered with your sleep:      On a scale of 0-10, during the past 24 hours, pain has affected your mood:       On a scale of 0-10, during the past 24 hours, pain has contributed to your stress:       On a scale of 0-10, what is your overall pain Rating:         Aggravated by:  sitting, standing, and extensive activity  /Alleviated by: Ice, Heat and Medications  Patient does not reports variability in the intensity and duration of pain.   Patient does not endorse activity avoidance.   Patient does not exhibit pain behaviors/excessive somatic focus.   Chart review does  not indicate problematic health care use patterns.   Pain score is currently 5/10      Pain interventions/procedures :  Physical Therapy:  Yes  Chiropractic:   No  Massage:   Yes  TENS:    No  Trigger point:   No  Nerve block:   No  Steroid Injection:  Yes  Ablation:   No      Medication use/misuse:  Currently prescribed potentially addictive medications other than opioids: Yes  History of running out of medications early: No  Family members in the home using opioids: No  History of medications being stolen: No  History of discharge from physician care for medication misuse: No  Keeps medications in safe location: Yes  Patient does report medication non-compliance.             Alcohol/illicit drug use:  Patient denies current use of alcohol  Patient denies current illicit drug use  Patient denies nicotine use  Patient denies family history of drug or alcohol addiction (distant estranged  relatives she has never met had alc. Hx)  Patient denies personal history of drug or alcohol use/misuse    Psychiatric:  Current Psychiatrist/Therapist:  Reports (pending)  Past history of outpatient treatment:  denies  Prior hospitalizations for psychiatric reasons:  denies  Suicide attempts:  denies  History of Trauma- denies  Psychotropic medications:  reports  Family history of psychiatric problems: denies  Relevant History:   Dezeray reports growing up in [place], [place]. She is one born of 2 siblings and was raised by both. She denies history of abuse. She denies having ethnic/religious/cultural practices that would influence treatment. Mayela denies having significant or traumatic events which impacted childhood.  Patient has been married.      Legal Concerns:  Patient denies ongoing or past legal entanglements.  Patient denies  disability benefits.      Education/Employment:  Patient completed college graduate. She does not  report having difficulty with literacy; can read and write adequately. Patient reports she had no  special education. Elodia did not receive Programme researcher, broadcasting/film/video.  Client is not currently working. She has worked with Medical laboratory scientific officer.  Shayonna denies military history.      Current Personal Status:  Unique is in a stable relationship.  Patient  lives with husband in a household.  Patient denies difficulty with transportation. denies significant financial stressors or debt.     Patient reports maintaining close contact with friends who are able to provide ongoing support and assistance.            Past Medical History:    Medical conditions:   Past Medical History:   Diagnosis Date    Depression     GERD (gastroesophageal reflux disease)     Low back pain     Neck problem     Osteoarthritis     Osteoarthritis     Wears glasses            Surgeries:   Past Surgical History:   Procedure Laterality Date    ANTERIOR FUSION CERVICAL SPINE  12/30/2020    FOOT SURGERY Bilateral     HX BREAST AUGMENTATION Bilateral     x3, 1974, 1994, 2014    HX CATARACT REMOVAL Bilateral 01/12/2021    HX ROTATOR CUFF REPAIR Right     SACROILIAC JOINT INJECTION      TOE AMPUTATION             Current Medications:   Current Outpatient Medications   Medication Sig    busPIRone (BUSPAR) 5 mg Oral Tablet Take 1 Tablet (5 mg total) by mouth in the morning and 1 Tablet (5 mg total) at noon and 1 Tablet (5 mg total) before bedtime. Do all this for 30 days.    carboxymethylcellulose sodium (ARTIFICIAL TEARS, CMC, OPHT) Administer into affected eye(s) Twice daily    conjugated estrogens (PREMARIN) 0.625 mg/gram Vaginal Cream Insert 0.5 g into the vagina Every 7 days    DEXILANT 60 mg Oral Cap, Delayed Rel., Multiphasic Take 1 Capsule (60 mg total) by mouth Once a day    diclofenac sodium (VOLTAREN) 1 % Gel Apply topically Three times a day    DULoxetine (CYMBALTA DR) 60 mg Oral Capsule, Delayed Release(E.C.) Take 1 Capsule (60 mg total) by mouth Once a day    fexofenadine HCl (ALLEGRA ORAL) Take 180 mg by mouth Every morning     Fexofenadine-Pseudoephedrine 180-240 mg Oral Tablet Sustained Release 24 hr Take 1 Tablet by mouth Once a day  guaiFENesin (MUCINEX) 600 mg Oral Tablet Extended Release 12hr Take 2 Tablets (1,200 mg total) by mouth Twice daily    METHOCARBAMOL ORAL Take 500 mg by mouth Every morning    mometasone furoate (NASONEX NASL) Administer into affected nostril(s) Twice daily    Non-Adherent Bandage (TELFA) 3 X 8 " Bandage Apply Vaseline to wounds. Then, apply Telfa pad. Secure with gauze (Patient not taking: Reported on 11/08/2021)    propranoloL (INDERAL) 60 mg Oral Tablet Take 1 Tablet (60 mg total) by mouth Once a day Indications: migraine prevention    rOPINIRole (REQUIP) 4 mg Oral Tablet Take 1 Tablet (4 mg total) by mouth Twice daily for 30 days       Allergies:   Allergies   Allergen Reactions    Latex     Darvon [Propoxyphene] Mental Status Effect     Hallucinations    Shellfish Derived            Suicidal Risk Factors:   Julianne does not have a history of suicide attempts.  She has not experienced a recent loss.  She denies perceiving self as a burden to others.  She denies having a history of self harm.  She does nothave a history of abuse.   She reports experiencing chronic pain.  She does not  act impulsively   She denies frequent drug and/or alcohol use.  She denies antisocial behaviors, including: fights, jail/prison, legal problems, probation.  She does not  live in a rural and/or isolated community.  Anieya reports she does not  have access to firearms.   She does not  have military history and did not serve in combat situations.  She denies experiencing or witnessing other traumatic events.    Mental Status    Eye Contact:  good.    Behavior:  cooperative.    Attention:  good.    Speech:  normal rate and volume.    Motor:  no psychomotor retardation or agitation.    Mood:  stable and tired.    Affect:  stable.    Thought Process:  linear.    Thought Content:  no paranoia or delusions.    Perception:  no  hallucinations endorsed  Cognition:  good abstract ability.    Insight:  fair.    Judgement:  fair.      ASSESSMENT/DIAGNOSIS:  Osteoarthritis, unspecified osteoarthritis type, unspecified site    Chronic pain    DDD (degenerative disc disease), lumbar    Cervical radiculopathy    Cervical spondylosis    Lumbar spondylosis    Lumbar radiculopathy    Chronic low back pain    Neck pain    Chronic hand pain, unspecified laterality    Chronic right shoulder pain    Migraine with aura       STANDARDIZED TESTING:    Pain Disability Index : a brief self-report measure of pain-related disability in discretionary and obligatory activities. Total score of  42/70 is in the moderate to high range and falls at the 62nd percentile on chronic pain norms.     Pain Catastrophizing Scale: a measure of negative and extreme cognitive reactions to pain highly predictive of pain-related disability. Total score of  1 is in the low range for pain catastrophization.  PHQ9: a self-report measure of severity of depression.   PHQ Questionnaire  Little interest or pleasure in doing things.: Several Days  Feeling down, depressed, or hopeless: Not at all  PHQ 2 Total: 1  Trouble falling  or staying asleep, or sleeping too much.: More than half the days  Feeling tired or having little energy: Several Days  Poor appetite or overeating: More than half the days  Feeling bad about yourself/ that you are a failure in the past 2 weeks?: Several Days  Trouble concentrating on things in the past 2 weeks?: More than half the days  Moving/Speaking slowly or being fidgety or restless  in the past 2 weeks?: More than half the days  Thoughts that you would be better off DEAD, or of hurting yourself in some way.: Not at all  If you checked off any problems, how difficult have these problems made it for you to do your work, take care of things at home, or get along with other people?: Somewhat difficult  PHQ 9 Total: 11  Interpretation of Total Score: 10-14  Moderate depression      GAD-7 Questionnaire 12/06/2021   Feeling nervous,anxious,on edge 0   Not being able to stop or control worrying 0   Worrying too much about different things 0   Trouble relaxing 1   Being so restless that it is hard to sit still 0   Becoming easily annoyed or irritable 0   Feeling afraid as if something awful might happen 0   How difficult have these problems made it for you to work, take care of things at home, or get along with other people? Somewhat difficult   Gad-7 Score Total 1   Interpretation 0-4, normal         CONCLUSIONS AND RECOMMENDATIONS:  CHRISTABELL LOSEKE is a 80 y.o. married Unknown female from [facility] [address],  who presents for assessment of  psychosocial functioning and pain treatment recommendations. In regard to opioids, patient is presenting with low risk for medication misuse of opioid pain medications.    Factors associated with safe and beneficial prescribing of opioid medication:    No history of aberrant medication use  History of stable dose of opioid  Patient reports pain reduction with oral opiates along with increased activity  Patient notes obtaining mood stability and voices commitment to taking   psychotropic medications.   Good premorbid social, psychological and vocational functioning  Good relationship with previous heath care providers  Patient maintains active structured schedule        Factors associated with poor potential outcomes from prescribing opioids:      Patient appears to exaggerate  pain-related disability.       Patient declines referral for CBT as she reports satisfaction with coping abilities and denies negitive mood symptoms associated with chronic pain.  Patient was given this providers information and instructed to reschedule as needed in the future.        Problem list reviewed for psychiatric diagnoses and substance use disorders only.    Burnett Sheng, LGSW , Clinical Therapist, 11/23/2021, 13:16    I have reviewed and  agree with above documentation.  Derrel Nip, LICSW

## 2021-11-28 ENCOUNTER — Ambulatory Visit (INDEPENDENT_AMBULATORY_CARE_PROVIDER_SITE_OTHER): Payer: Self-pay | Admitting: Rehabilitative and Restorative Service Providers"

## 2021-11-28 ENCOUNTER — Encounter (INDEPENDENT_AMBULATORY_CARE_PROVIDER_SITE_OTHER): Payer: Self-pay | Admitting: Counselor

## 2021-11-28 ENCOUNTER — Encounter (INDEPENDENT_AMBULATORY_CARE_PROVIDER_SITE_OTHER): Payer: Self-pay | Admitting: Chiropractor

## 2021-12-06 ENCOUNTER — Telehealth (HOSPITAL_BASED_OUTPATIENT_CLINIC_OR_DEPARTMENT_OTHER): Payer: Self-pay | Admitting: Internal Medicine

## 2021-12-06 NOTE — Telephone Encounter (Signed)
Patient's husband present to clinic for an appointment and mentioned that his wife would like some changes to medications. The patient is requesting Medroxyprogesterine and Estradiol instead of the prempro. The patient and husband stated that the prempro is expensive and this may be an alternative.  Advise to patient and husband that i would send a message to the physician. No other questions or concerns were mentioned at this time. Franciso Bend, LPN  42/70/6237, 62:83

## 2021-12-07 NOTE — Telephone Encounter (Signed)
Called patient, verified name and DOB. Patient stated she is still taking the Prempro. Advised patient to discontinue per physician message. Patient verbalized understanding and agreed to discontinue the medication. No other concerns at this time. Franciso Bend, LPN  39/53/2023, 34:35

## 2021-12-08 ENCOUNTER — Telehealth (INDEPENDENT_AMBULATORY_CARE_PROVIDER_SITE_OTHER): Payer: Self-pay | Admitting: NURSE PRACTITIONER-ADULT HEALTH

## 2021-12-08 ENCOUNTER — Other Ambulatory Visit (HOSPITAL_BASED_OUTPATIENT_CLINIC_OR_DEPARTMENT_OTHER): Payer: Self-pay | Admitting: Internal Medicine

## 2021-12-08 ENCOUNTER — Ambulatory Visit (HOSPITAL_BASED_OUTPATIENT_CLINIC_OR_DEPARTMENT_OTHER): Payer: Self-pay | Admitting: Internal Medicine

## 2021-12-08 DIAGNOSIS — M1990 Unspecified osteoarthritis, unspecified site: Secondary | ICD-10-CM

## 2021-12-08 MED ORDER — OXYCODONE 10 MG TABLET
10.0000 mg | ORAL_TABLET | Freq: Four times a day (QID) | ORAL | 0 refills | Status: DC | PRN
Start: 2021-12-08 — End: 2021-12-21

## 2021-12-08 NOTE — Telephone Encounter (Signed)
Called the patient. Patient answered and confirmed full name and date of birth. Relayed to the patient Dr. Megan Salon is willing to refill her medication until Thursday. The patient verbalized understanding and had no further questions.  Corbin Ade, RN  12/08/2021, 16:24

## 2021-12-08 NOTE — Telephone Encounter (Signed)
Regarding: more information  ----- Message from Mearl Latin sent at 12/08/2021  3:52 PM EST -----  [name], MD     Pain clinic also could not get her in until next Thursday and she will be out of this med before then. She said due to her pain she is afraid to be without. Please call pt to advise.

## 2021-12-08 NOTE — Telephone Encounter (Signed)
Received call from the call center from the patient. Patient stated she does not want her medication to change. I informed the patient the pain clinic will do their initial assessment of her pain and assess what has worked for her and what has not and they will make adjustments as needed. I told the patient since her current pain management regimen is not working that is the indication for them to do an initial assessment. The patient verbalized understanding.     The patient then stated she will be out of her medication after tomorrow since she cannot get to the pain clinic until Thursday. I told the patient I would relay the information to Dr. Megan Salon and call her back if she is willing to refill it for her. The patient verbalized understanding and had no further questions.   Corbin Ade, RN  12/08/2021, 16:04

## 2021-12-08 NOTE — Telephone Encounter (Signed)
Regarding: pain clinic wanting to make changes  ----- Message from Mearl Latin sent at 12/08/2021  3:50 PM EST -----  [name], MD     Pain clinic told pt when she goes to be interviewed they would look at alternatives for her oxycodone in which she has been on for 11 years. And she has never asked to increase it. She has reduced it. But it works well for her. Pt has moved here from [place] and is trying to get all her meds straight. She is asking you to give her a call to discuss this. She does not want these changes to happen. Please call pt to advise.

## 2021-12-08 NOTE — Telephone Encounter (Signed)
Case manager contacted patient to schedule follow up post opioid evaluation. Patient is scheduled for 12/14/21 at 8:20am with Vergie Living.    12/08/2021  Geoffery Spruce, CASE MANAGER  12/08/2021, 11:47

## 2021-12-11 ENCOUNTER — Ambulatory Visit: Payer: Medicare Other | Attending: Ophthalmology | Admitting: Ophthalmology

## 2021-12-11 ENCOUNTER — Encounter (INDEPENDENT_AMBULATORY_CARE_PROVIDER_SITE_OTHER): Payer: Self-pay | Admitting: Ophthalmology

## 2021-12-11 ENCOUNTER — Other Ambulatory Visit: Payer: Self-pay

## 2021-12-11 DIAGNOSIS — Z961 Presence of intraocular lens: Secondary | ICD-10-CM | POA: Insufficient documentation

## 2021-12-11 DIAGNOSIS — H538 Other visual disturbances: Secondary | ICD-10-CM | POA: Insufficient documentation

## 2021-12-11 DIAGNOSIS — H53453 Other localized visual field defect, bilateral: Secondary | ICD-10-CM | POA: Insufficient documentation

## 2021-12-11 NOTE — Progress Notes (Addendum)
[name]  [place] [id number]  Operated by [facility]         Patient Name: Rachel [name]  MRN#: [id number]  Birthdate: 11-06-1941    Date of Service: 12/11/2021    Chief Complaint    Follow Up Cornea Problem         Rachel [name] is a 80 y.o. 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 Pt states vision is blurry OU, OD>OS.  Pt denies any flashes or floaters. Pt is photophobic. Pt is using AT's 2-3 ou. Pt is NOT using Restasis. Pt states she has problems reading closed caption on TV.           Last edited by Blima Rich, COA on 12/11/2021 10:30 AM.        ROS    Positive for: Eyes  Negative for: Constitutional, Gastrointestinal, Neurological, Skin, Genitourinary, HENT, Endocrine, Cardiovascular, Respiratory, Psychiatric, Allergic/Imm, Heme/Lymph  Last edited by Blima Rich, [place] on 12/11/2021 10:30 AM.         All other systems Negative    Blima Rich, [place]  54/00/8676, 10:33      Base Eye Exam     Visual Acuity (Snellen - Linear)       Right Left    Dist cc 20/60 20/40 +2    Dist ph cc 20/30 +1 20/25 -1    Correction: Glasses          Pupils       APD    Right None    Left None          Visual Fields (Counting fingers)       Right Left     Full Full          Extraocular Movement       Right Left     Full Full          Neuro/Psych     Oriented x3: Yes    Mood/Affect: Normal            Slit Lamp and Fundus Exam     External Exam       Right Left    External Normal Normal          Slit Lamp Exam       Right Left    Lids/Lashes Normal Normal    Conjunctiva/Sclera White and quiet White and quiet    Cornea Clear Clear    Anterior Chamber Deep and quiet Deep and quiet    Iris Round and reactive Round and reactive    Lens multifocal toric, open PC multifocal toric, open PC    Anterior Vitreous pvd Normal          Fundus Exam       Right Left     Disc Normal Normal    C/D Ratio 0.3 0.3    Macula rare drusen rare drusen    Vessels Normal Normal    Periphery extramacular drusen extramacular drusen            Refraction     Wearing Rx       Sphere Cylinder Axis Add    Right [place] +0.50 120 +2.75    Left -0.25 +1.25 175 +2.75    Type: PAL  Manifest Refraction       Sphere Cylinder Axis Dist VA    Right [place] +0.75 013 20/20    Left -0.25 +1.50 172 20/20-2                        MD Addition to HPI: Decreased VA OU since her eye surgery 2 years ago    [place]   1. Blurred vision, bilateral  H53.8   2. Peripheral vision loss, bilateral  H53.453   3. PEM Symfony toric IOL OU in 10/2019 in [place]  Z96.1     Orders Placed This Encounter   Procedures    OPH CORNEAL MAPPING BI    OPH 3 ISOPTERS VF     Topo - pentacam OU  GQS OU  OD:  Reg ast  OS:  Mild Irreg ast    Ophthalmic Plan of Care:    She has C/O of decreased VF inferiorly    Will obtain 24-2 VF OU in the next few weeks    I will review the records from her cataract surgery    Follow up:    I have asked Rachel [name] to follow up 1 month or PRN          I have seen and examined the above patient. I discussed the above diagnoses listed in the assessment and the above ophthalmic plan of care with the patient and patient's family. All questions were answered. I reviewed and, when necessary, made changes to the technician/resident note, documented ophthalmology exam, chief complaint, history of present illness, allergies, review of systems, past medical, past surgical, family and social history. I personally reviewed and interpreted all testing and/or imaging performed at this visit and agree with the resident's or fellow's interpretation. Any exceptions/additions are edited/noted in the relevant encounter fields.      Leonia Corona, MD  12/11/2021, 13:26

## 2021-12-13 ENCOUNTER — Inpatient Hospital Stay
Admission: RE | Admit: 2021-12-13 | Discharge: 2021-12-13 | Disposition: A | Discharge status: Home / Self Care | Payer: Medicare Other | Source: Ambulatory Visit | Attending: Ophthalmology | Admitting: Ophthalmology

## 2021-12-13 ENCOUNTER — Encounter (INDEPENDENT_AMBULATORY_CARE_PROVIDER_SITE_OTHER): Payer: Self-pay | Admitting: NURSE PRACTITIONER-ADULT HEALTH

## 2021-12-13 DIAGNOSIS — H538 Other visual disturbances: Secondary | ICD-10-CM | POA: Insufficient documentation

## 2021-12-13 NOTE — Nursing Note (Signed)
[place] Controlled Substance Full Name Report Report Date 12/13/2021   From 06/13/2021 To 12/12/2021 Date of Birth [birth year] Prescription Count 6   Last Name SLOMSKI First Name [place] Name                   Patients included in report that appear to match the search criteria.   Last Name First Name Middle Name Gender Address   [address] , [place], [place], [address]   Yearsley Tulsi A F [address] , [place], [place], [address]         M.E.D.D. Morphine Equivalent Daily Dose  [place] CSAPP/RxDataTrack Patient Reports  This is an Active Cumulative Morphine Equivalent score added to patient reports, when the patient has active prescriptions for opioids. This feature is meant to readily identify the potency comparison among different prescribed opioids as a single score describing the equivalent dose of morphine available to be taken on a daily basis. It is a guide ONLY.    According to CDC, a score of more than 50 requires caution and a score of 90 or more indicates a significant increase in the risk for an overdose. ACTIVE MORPHINE EQUIVALENT      64   This table shows the prescriptions used to compute the MEDD score.   MEDD Score Calculator   RX# Drug Strength Unit  Multiplier  Quantity  Days  MEDD   MIATA CULBRETH    [id number] Oxycodone Hydrochloride 10 mg x 1.50 x 30 / 7 = 64   Cumulative Active Morphine Equivalent                           64   It does not reflect any information on the use of any other type of controlled substance such as benzodiazepines, stimulants, or sedatives. if a patient does not have an active prescription for an opiate no score will be visible on the report.         Prescriber Name Prescriber Kem[place] Name [place] Zip Rx Written Date Rx Dispense Date  & Date Sold   Rx Number Product Name MEDD Status Strength Qty Days # of Refill Sched Payment Type   [address], [place], [place], [place] [id number] 12/08/2021 12/08/2021 12/11/2021 [id number] Oxycodone Hydrochloride ACTIVE 10 MG         30 7 0/0 CII Medicare   [address], [place], [place], [place] [id number] [place] [id number] 10/23/2021 10/24/2021 10/28/2021 [id number] Oxycodone Hydrochloride INACTIVE 10 MG         120 30 0/0 CII Medicare   Holley Raring [place], [place]) [id number] [place] [id number] 10/23/2021 10/23/2021 10/23/2021 [id number] Apap/Codeine INACTIVE 300 MG-30 MG 15 3 0/0 CIII Insurance   [name] [id number] [place] [id number] 10/04/2021 10/04/2021 10/05/2021 [id number] Oxycodone Hydrochloride INACTIVE 10 mg/1 40 10 0/0 CII Medicare   [name] [id number] [place] [id number] 09/13/2021 09/13/2021 09/13/2021 [id number] Oxycodone Hydrochloride INACTIVE 10 mg/1 40 10 0/0 CII Medicare   [name] [id number] [place]. [id number] 09/13/2021 09/13/2021 09/13/2021 [id number] Butal/Apap/Caff INACTIVE 50/300/40MG  30 30 0/0 CIII Insurance

## 2021-12-14 ENCOUNTER — Ambulatory Visit: Payer: Medicare Other | Attending: NURSE PRACTITIONER-ADULT HEALTH | Admitting: NURSE PRACTITIONER-ADULT HEALTH

## 2021-12-14 ENCOUNTER — Encounter (INDEPENDENT_AMBULATORY_CARE_PROVIDER_SITE_OTHER): Payer: Self-pay | Admitting: NURSE PRACTITIONER-ADULT HEALTH

## 2021-12-14 ENCOUNTER — Other Ambulatory Visit: Payer: Self-pay

## 2021-12-14 VITALS — BP 144/85 | HR 70 | Temp 98.1°F | Resp 18 | Ht 63.0 in | Wt 106.9 lb

## 2021-12-14 DIAGNOSIS — M4722 Other spondylosis with radiculopathy, cervical region: Secondary | ICD-10-CM

## 2021-12-14 DIAGNOSIS — G8929 Other chronic pain: Secondary | ICD-10-CM | POA: Insufficient documentation

## 2021-12-14 DIAGNOSIS — R413 Other amnesia: Secondary | ICD-10-CM | POA: Insufficient documentation

## 2021-12-14 DIAGNOSIS — M5136 Other intervertebral disc degeneration, lumbar region: Secondary | ICD-10-CM | POA: Insufficient documentation

## 2021-12-14 DIAGNOSIS — M1990 Unspecified osteoarthritis, unspecified site: Secondary | ICD-10-CM | POA: Insufficient documentation

## 2021-12-14 DIAGNOSIS — M47812 Spondylosis without myelopathy or radiculopathy, cervical region: Secondary | ICD-10-CM | POA: Insufficient documentation

## 2021-12-14 DIAGNOSIS — M5412 Radiculopathy, cervical region: Secondary | ICD-10-CM | POA: Insufficient documentation

## 2021-12-14 NOTE — Nursing Note (Signed)
Return Patient Visit    Rachel [name]    Chief Complaint   Patient presents with    Low Back Pain     F/u opioid evaluation (low risk)         [place] Pain Rating Scale     On a scale of 0-10, during the past 24 hours, pain has interfered with you usual activity: 5     On a scale of 0-10, during the past 24 hours, pain has interfered with your sleep: 6    On a scale of 0-10, during the past 24 hours, pain has affected your mood: 3     On a scale of 0-10, during the past 24 hours, pain has contributed to your stress: 8     On a scale of 0-10, what is your overall pain Rating: 8        Vitals:    12/14/21 0816   BP: (!) 144/85   Pulse: 70   Resp: 18   Temp: 36.7 C (98.1 F)   SpO2: 95%   Weight: 48.5 kg (106 lb 14.8 oz)   Height: 1.6 m (5\' 3" )   PainSc:   8   PainLoc: Back       Body mass index is 18.94 kg/m.    New imaging since last visit:  1. No  Reason for today's visit: Pt here for f/u opioid evaluation (low risk)    Ladell Pier, Ambulatory Care Assistant  12/14/2021, 08:18

## 2021-12-14 NOTE — Progress Notes (Addendum)
[facility]  [address], [place], [place] [address]  [phone]    Progress Note      Rachel [name]  MRN: [id number]  DOB: 1940/12/27  Date of Service: 12/22/2021    CHIEF COMPLAINT  Chief Complaint   Patient presents with    Low Back Pain     F/u opioid evaluation (low risk)     Update: Patient here for post-opioid evaluation. She was determined to be low risk for medication misuse. Did discuss some ongoing concerns as discussed in initial appointment, especially to include her dizziness/hypoglycemic episodes. Reports that she has been cutting out some medications through trial and error to see if symptoms improve. Reports she recently stopped taking Buspar on her on. Chart review significant intake with a PharmD that recommended that she take 250mg  dose Robaxin instead of 500 mg to help with tiredness, which patient reports is an ongoing problem for her but improving some. Denies any further hypoglycemic episodes. Is adamant that her oxycodone 10 mg QID PRN does not contribute to her tiredness. She reports that she is still taking this QID. Believes she has #4 tabs remaining from script filled on 12/19 per BOP review (was given #30 tabs). She denies taking more medication than 4 tabs daily, but would be short of medication from bridge script on 12/19. Denies constipation with medication. Reports pain today is consistent with chronic multiple joint pain and predominantly neck pain. Discussed with patient that we have not yet received records from [place] in [place]. When discussing this, patient reports that she has been evaluated by a Neurosurgeon here at [facility] recently and at one time indicated that her "surgery was here in [facility]." Is no longer taking Fioricet, still taking Propanolol. She doe not report any red flag symptoms/myelopathy. Still having neck pain and bilateral radicular arm pain as present in initial encounter.    HISTORY OF PRESENT  ILLNESS  Rachel [name] is a 80 y.o. female presenting to clinic to establish care. Patient recently moved here from [place], [place] and is taking care of her husband. The patient reports recent neck surgery in January for C3-C5 fusion with plates. She had a follow up with her neurosurgeon in [place] at 6 months and was not due to be seen again. Patient endorses neck and shoulder pain that radiates down her arms and into her fingers. This pain began 10 years ago when she fell on her shoulder. Patient states that she had surgery to repair it however they missed an additional tear and it has been painful since. After this incident is when she was prescribed oxycodone 10mg  q6h for her pain in addition to low back and hand pain (arthritis). She describes the shoulder pain as stabbing and aching. The pain is intermittent with aggravating factors include using her right arm. Alleviating factors include rest and ice with alternating heat. She has done physical therapy for her shoulder off and on for 6 years. 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 Patient states that they occur everyday and she usually wakes up with them. Patient also endorses aura with pain presenting behind her eyes before the migraine appears. She is currently on propranolol and Fioricet and she said they work decent but could be better. In addition patient has endorsed dizzy spells that she has attributed to forgetting to eat meals and hypoglycemia. States PCP is working it up. Patient complains of chronic hand pain that  started 1.5 years ago. Patient describes the pain as stabbing and aching. The pain is intermittent and is aggravated by use with daily activities. Alleviating factors include rest and massage. Patient also endorses bilateral low back pain that has been present for 10+ years. She describes the pain as an intense ache that radiates down her buttocks and into the sides of her knees. Aggravating  factors include changing positions and walking. Alleviating factors include laying down and ice. Of note patient obtained medical marijuana card from pain management doctor in [place] but no longer has the card and is expired. Patient used topical marijuana gel on joints.     Most recent PT completed: shoulder for 6 years     Previous procedures: TPIs in hands and wrist, shoulder, neck and low back and coccyx - would last over 6 months    Previous medications tried:  Tylenol: arthritis  NSAIDS: Motrin   Anticonvulsants (gabapentin, pregabalin): gabapentin   SNRIs (duloxetine, venlafaxine, milnacipran): currently on Cymbalta   TCAs (desipramine, nortriptyline, imipramine, doxepin, amitriptyline) : None  Atypical antidepressants (buproprion): None  Muscle relaxants (cyclobenzaprine, methocarbamol, baclofen, tizanidine, carisoprodol, metaxolone): currently on robaxin    Topicals (lidocaine, voltaren, capsaicin, compound cream): using voltaren    Other (ketamine, zinconatide): None  Opioids: oxycodone 10 years for back pain   Sleep aids (melatonin, trazadone, ambien, benzodiazepines): none    PAST MEDICAL HISTORY  Past Medical History:   Diagnosis Date    Depression     GERD (gastroesophageal reflux disease)     Low back pain     Neck problem     Osteoarthritis     Osteoarthritis     Wears glasses      MEDICATIONS  busPIRone (BUSPAR) 5 mg Oral Tablet, Take 1 Tablet (5 mg total) by mouth in the morning and 1 Tablet (5 mg total) at noon and 1 Tablet (5 mg total) before bedtime. Do all this for 30 days.  carboxymethylcellulose sodium (ARTIFICIAL TEARS, CMC, OPHT), Administer into affected eye(s) Twice daily  DEXILANT 60 mg Oral Cap, Delayed Rel., Multiphasic, Take 1 Capsule (60 mg total) by mouth Once a day  diclofenac sodium (VOLTAREN) 1 % Gel, Apply topically Three times a day  DULoxetine (CYMBALTA DR) 60 mg Oral Capsule, Delayed Release(E.C.), Take 1 Capsule (60 mg total) by mouth Once a day  fexofenadine HCl  (ALLEGRA ORAL), Take 180 mg by mouth Every morning  Fexofenadine-Pseudoephedrine 180-240 mg Oral Tablet Sustained Release 24 hr, Take 1 Tablet by mouth Once a day  guaiFENesin (MUCINEX) 600 mg Oral Tablet Extended Release 12hr, Take 2 Tablets (1,200 mg total) by mouth Twice daily  METHOCARBAMOL ORAL, Take 500 mg by mouth Every morning  mometasone furoate (NASONEX NASL), Administer into affected nostril(s) Twice daily  Non-Adherent Bandage (TELFA) 3 X 8 " Bandage, Apply Vaseline to wounds. Then, apply Telfa pad. Secure with gauze  conjugated estrogens (PREMARIN) 0.625 mg/gram Vaginal Cream, Insert 0.5 g into the vagina Every 7 days  Oxycodone (ROXICODONE) 10 mg Oral Tablet, Take 1 Tablet (10 mg total) by mouth Every 6 hours as needed for Pain Indications: pain, osteoarthritis  propranoloL (INDERAL) 60 mg Oral Tablet, Take 1 Tablet (60 mg total) by mouth Once a day Indications: migraine prevention  rOPINIRole (REQUIP) 4 mg Oral Tablet, Take 1 Tablet (4 mg total) by mouth Twice daily for 30 days    No facility-administered medications prior to visit.      ALLERGIES  Allergies   Allergen Reactions  Latex     Darvon [Propoxyphene] Mental Status Effect     Hallucinations    Shellfish Derived        PAST SURGICAL HISTORY  Past Surgical History:   Procedure Laterality Date    ANTERIOR FUSION CERVICAL SPINE  12/30/2020    FOOT SURGERY Bilateral     HX BREAST AUGMENTATION Bilateral     x3, 1974, 1994, 2014    HX CATARACT REMOVAL Bilateral 01/12/2021    HX ROTATOR CUFF REPAIR Right     SACROILIAC JOINT INJECTION      TOE AMPUTATION       IMMUNIZATIONS  Immunization History   Administered Date(s) Administered    Covid-19 Vaccine,Moderna Bivalent Booster,43yrs+ 09/13/2021    Covid-19 Vaccine,Moderna,12 Years+ 02/14/2020, 03/12/2020, 11/14/2020    High-Dose Influenza Vaccine, 65+ 10/25/2015, 11/04/2015, 09/23/2016, 09/23/2017, 10/01/2018, 11/02/2019    INFLUENZA VIRUS VACCINE (ADMIN) 09/15/2016    Influenza  Vaccine, 6 month-adult 09/24/2007, 10/05/2010, 12/20/2012, 11/24/2013, 09/30/2018, 09/23/2020    Influenza Vaccine, 65+ 11/12/2020    PREVNAR 13 11/04/2015    Pneumovax 10/03/2021    Shingrix - Zoster Vaccine 06/19/2017, 10/03/2021    Tetanus Toxoid/Diphtheria Toxoid/Acellular Pertussis Vaccine, Adsorbed (Tdap) 10/04/2011    ZOSTAVAX (VARICELLA ZOSTER VACCINE) 06/04/2007       FAMILY HISTORY  Family Medical History:     Problem Relation (Age of Onset)    No Known Problems Mother, Father        SOCIAL HISTORY  Social History     Socioeconomic History    Marital status: Married   Tobacco Use    Smoking status: Never    Smokeless tobacco: Never   Vaping Use    Vaping Use: Never used   Substance and Sexual Activity    Alcohol use: Not Currently    Drug use: Not Currently     Frequency: 5.0 times per week     Types: Marijuana     Comment: medical marijuana gel   Other Topics Concern    Ability to Walk 1 Flight of Steps without SOB/CP Yes    Routine Exercise No    Ability To Do Own ADL's Yes    Uses Walker No    Other Activity Level Yes     Comment: light house work    Electronics engineer Yes       REVIEW OF SYSTEMS  Positive ROS discussed in HPI, otherwise all other systems negative.      PHYSICAL EXAM  Vitals: Blood pressure (!) 144/85, pulse 70, temperature 36.7 C (98.1 F), resp. rate 18, height 1.6 m (5\' 3" ), weight 48.5 kg (106 lb 14.8 oz), SpO2 95 %. Body mass index is 18.94 kg/m.  General: appears stated age, no acute distress  HEENT: conjunctiva clear; pupils equal and round. Facial features symmetrical  Abdomen: soft, non-tender  Skin: no rashes, lesions   Neurologic: gait normal, motor and sensory intact bilaterally, CN 2-12 grossly intact, AOx4, DTR intact and +4, bilateral Hoffmans present, No ankle clonus, speech clear and logical  Cardiovascular: No appreciable edema  Psychiatric: normal affect and behavior, memory deficit  Musculoskeletal:  Cervical   ROM: Limited extension and Limited rotation    Palpation:  Non-tender   Motor: 5/5 in UEs b/l   Sensory: Intact in UEs b/l   Facet loading: Negative bilaterally   Facet TTP: Negative bilaterally   Trigger points: Negative     Lumbar   ROM: Preserved   Palpation: Tender   Motor: 5/5 in LEs b/l  Sensory: Intact in LEs b/l                 ASSESSMENT    ICD-10-CM    1. Osteoarthritis, unspecified osteoarthritis type, unspecified site  M19.90 Referral to [facility]      2. Chronic pain  G89.29 Referral to [facility]      3. DDD (degenerative disc disease), lumbar  M51.36 Referral to [facility]      4. Cervical radiculopathy  M54.12 Referral to [facility]      5. 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 Previous referrals placed to Neurology and Neurosurgery. Will update cervical MRI due to symmetric hyperreflexia, ongoing radiculopathy cervical spine. Recommend visit with Neurology/Neurosurgery as soon as possible.  2. Urine drug screen repeated today  3. When discussing opioid regimen, I did express concern to patient of safety with opioid dosing and ongoing reports of sedation/tiredness. I educated patient that if interested in [place] taking over opioid medications, I would recommend gradually dose-reducing her medication to better evaluate her symptoms to assure safety. When this was discussed, patient stated that she may only take 10 mg tablets 3 times daily "once she had time to think about it so that she wouldn't run out of  medication." I discussed that today I would recommend reducing one dose oxycodone to be taken as follows: 10 mg/10mg /10mg /5mg . Additionally, I discussed that I would recommend trial of Robaxin 250mg  daily instead of 500 mg. I recommended NeuroPsych testing for additional evaluation of suspected memory impairment, which patient was amenable to. As far as medication recommendations, she reports that when she moved from [place], that "everyone wants to change everything," so she elected to discuss with family physician about recommendations as above and elected for our office not to take over medication. I encouraged patient to reach out to PCP today regarding recommendations/presentation today for additional evaluation. Discouraged patient from abruptly discontinuing any medication and to discuss with prescribing physician about Buspar. Discouraged abrupt discontinuation of oxycodone- as patient declined recommendations today, additional scripts oxycodone will need to come through PCP. Will reach out to PCP regarding patient encounter today.   4. Patient elected to bring medication in for a pill count later today to assess compliance with bridge prescription as provided by PCP. If BOP is correct, and patient filled medication on 12/19 and is taking as prescribed (Q6 hr PRN), patient should have #14 tablets remaining for today, minus what she would take for today's allotment of medication.  5. RPV after imaging or after follow-up with PCP    Reviewed red flag signs and when to report to ER for evaluation.     Late entry  Vergie Living, NP  12/22/2021, 07:56  Patient seen with Bennie Hind, PA-C  Addendum: PCP to reach out to patient regarding Neuropsych testing as well as regarding current opioid regimen.

## 2021-12-14 NOTE — Nursing Note (Signed)
9 pills oxycodone last taken 12.22.22.12pm  12.16.22 last fill.  Jacquenette Shone, Ambulatory Care Assistant  12/14/2021, 16:09

## 2021-12-15 ENCOUNTER — Ambulatory Visit: Payer: Medicare Other

## 2021-12-15 MED ORDER — NALOXONE 4 MG/ACTUATION NASAL SPRAY
1.0000 | NASAL | 0 refills | Status: DC | PRN
Start: 2021-12-15 — End: 2022-02-16

## 2021-12-21 ENCOUNTER — Ambulatory Visit (HOSPITAL_BASED_OUTPATIENT_CLINIC_OR_DEPARTMENT_OTHER): Payer: Self-pay | Admitting: Internal Medicine

## 2021-12-21 ENCOUNTER — Other Ambulatory Visit (HOSPITAL_BASED_OUTPATIENT_CLINIC_OR_DEPARTMENT_OTHER): Payer: Self-pay | Admitting: Internal Medicine

## 2021-12-21 DIAGNOSIS — G43909 Migraine, unspecified, not intractable, without status migrainosus: Secondary | ICD-10-CM

## 2021-12-21 DIAGNOSIS — M1990 Unspecified osteoarthritis, unspecified site: Secondary | ICD-10-CM

## 2021-12-21 DIAGNOSIS — N898 Other specified noninflammatory disorders of vagina: Secondary | ICD-10-CM

## 2021-12-21 DIAGNOSIS — G2581 Restless legs syndrome: Secondary | ICD-10-CM

## 2021-12-21 MED ORDER — ROPINIROLE 4 MG TABLET
4.0000 mg | ORAL_TABLET | Freq: Two times a day (BID) | ORAL | 0 refills | Status: AC
Start: 2021-12-21 — End: 2022-01-20

## 2021-12-21 MED ORDER — PROPRANOLOL 60 MG TABLET
60.0000 mg | ORAL_TABLET | Freq: Every day | ORAL | 0 refills | Status: DC
Start: 2021-12-21 — End: 2021-12-27

## 2021-12-21 MED ORDER — OXYCODONE 10 MG TABLET
10.0000 mg | ORAL_TABLET | Freq: Three times a day (TID) | ORAL | 0 refills | Status: DC | PRN
Start: 2021-12-21 — End: 2021-12-27

## 2021-12-21 MED ORDER — CONJUGATED ESTROGENS 0.625 MG/GRAM VAGINAL CREAM
0.5000 g | TOPICAL_CREAM | VAGINAL | 0 refills | Status: DC
Start: 2021-12-21 — End: 2021-12-27

## 2021-12-21 NOTE — Telephone Encounter (Signed)
Pended medication for review. [name], RN  12/21/2021, 15:28

## 2021-12-21 NOTE — Telephone Encounter (Signed)
Regarding: wrong pharmacy can you help.  ----- Message from Burnadette Pop sent at 12/21/2021  3:30 PM EST -----  conjugated estrogens (PREMARIN) 0.625 mg/gram Vaginal Cream 30 g 0 11/13/2021    Sig - Route: Insert 0.5 g into the vagina Every 7 days - Vaginal   Sent to pharmacy as: conjugated estrogens 0.625 mg/gram vaginal cream (PREMARIN)   Class: E-Rx   Non-formulary Exception Code: WYSHU/OH Formulary Info Available   E-Prescribing Status: Receipt confirmed by pharmacy (11/13/2021 2:50 PM EST)     [facility] to [facility] as well    ----- Message from Burnadette Pop sent at 12/21/2021  3:17 PM EST -----  Dr. Megan Salon    rOPINIRole (REQUIP) 4 mg Oral Tablet 180 Tablet 0 11/13/2021  This was sent to [facility] and should have gone to [facility].    Sig - Route: Take 1 Tablet (4 mg total) by mouth Twice daily for 30 days - Oral   Sent to pharmacy as: rOPINIRole 4 mg tablet (REQUIP)   Class: E-Rx   Non-formulary Exception Code: FGBMS/XJ Formulary Info Available   E-Prescribing Status: Receipt confirmed by pharmacy (11/13/2021 2:50 PM EST)     propranoloL (INDERAL) 60 mg Oral Tablet 90 Tablet 0 11/13/2021  [facility] to [facility] to [facility].  Sig - Route: Take 1 Tablet (60 mg total) by mouth Once a day Indications: migraine prevention - Oral   Sent to pharmacy as: propranoloL 60 mg tablet (INDERAL)   Class: E-Rx   Non-formulary Exception Code: DBZMC/EY Formulary Info Available   E-Prescribing Status: Receipt confirmed by pharmacy (11/13/2021 2:50 PM EST)     Would like enough oxycodone to get through new years.    Preferred Pharmacy     [facility] [place], [place] [facility] [place]    [place] [address]    Phone: [phone] Fax: [phone]    Hours: Not open 24 hours

## 2021-12-21 NOTE — Telephone Encounter (Signed)
Regarding: dropped call  ----- Message from Noelle Penner sent at 12/21/2021  3:19 PM EST -----      Pt states she was just talking with Dr Renita Papa and the call dropped.

## 2021-12-21 NOTE — Telephone Encounter (Signed)
Patient needs to be seen early January to discuss pain medication.

## 2021-12-21 NOTE — Telephone Encounter (Signed)
Regarding: wrong pharmacy can you help.  ----- Message from Burnadette Pop sent at 12/21/2021  3:17 PM EST -----  Dr. Megan Salon    rOPINIRole (REQUIP) 4 mg Oral Tablet 180 Tablet 0 11/13/2021  This was sent to [facility] and should have gone to [facility].    Sig - Route: Take 1 Tablet (4 mg total) by mouth Twice daily for 30 days - Oral   Sent to pharmacy as: rOPINIRole 4 mg tablet (REQUIP)   Class: E-Rx   Non-formulary Exception Code: YMEBR/AX Formulary Info Available   E-Prescribing Status: Receipt confirmed by pharmacy (11/13/2021 2:50 PM EST)     propranoloL (INDERAL) 60 mg Oral Tablet 90 Tablet 0 11/13/2021  Sent to [facility] should [facility] to [facility].  Sig - Route: Take 1 Tablet (60 mg total) by mouth Once a day Indications: migraine prevention - Oral   Sent to pharmacy as: propranoloL 60 mg tablet (INDERAL)   Class: E-Rx   Non-formulary Exception Code: ENMMH/WK Formulary Info Available   E-Prescribing Status: Receipt confirmed by pharmacy (11/13/2021 2:50 PM EST)     Would like enough oxycodone to get through new years.    Preferred Pharmacy     [facility] [place], [place] [address]    [address] [address]    Phone: [phone] Fax: [phone]    Hours: Not open 24 hours

## 2021-12-26 ENCOUNTER — Telehealth (HOSPITAL_BASED_OUTPATIENT_CLINIC_OR_DEPARTMENT_OTHER): Payer: Self-pay | Admitting: Internal Medicine

## 2021-12-26 NOTE — Telephone Encounter (Signed)
Called patient to schedule new medicare wellness visit tomorrow while seeing Dr. Megan Salon. Patient is unable to do this as she has another appt. Patient rescheduled March's follow up to March 29th with Dr. Megan Salon so that she could coordinate her medicare wellness following that appt. Mailed appt reminders.Alonza Bogus, RN  12/26/2021, 14:24

## 2021-12-27 ENCOUNTER — Other Ambulatory Visit (HOSPITAL_BASED_OUTPATIENT_CLINIC_OR_DEPARTMENT_OTHER): Payer: Medicare PPO

## 2021-12-27 ENCOUNTER — Ambulatory Visit: Payer: Medicare PPO | Attending: Hand Surgery | Admitting: Hand Surgery

## 2021-12-27 ENCOUNTER — Other Ambulatory Visit: Payer: Self-pay

## 2021-12-27 ENCOUNTER — Encounter (INDEPENDENT_AMBULATORY_CARE_PROVIDER_SITE_OTHER): Payer: Self-pay | Admitting: Hand Surgery

## 2021-12-27 ENCOUNTER — Ambulatory Visit (HOSPITAL_BASED_OUTPATIENT_CLINIC_OR_DEPARTMENT_OTHER): Payer: Medicare PPO | Admitting: Internal Medicine

## 2021-12-27 ENCOUNTER — Encounter (INDEPENDENT_AMBULATORY_CARE_PROVIDER_SITE_OTHER): Payer: Self-pay | Admitting: NURSE PRACTITIONER-ADULT HEALTH

## 2021-12-27 ENCOUNTER — Encounter (HOSPITAL_BASED_OUTPATIENT_CLINIC_OR_DEPARTMENT_OTHER): Payer: Self-pay | Admitting: Internal Medicine

## 2021-12-27 VITALS — BP 158/76 | HR 103 | Temp 98.4°F | Ht 64.0 in | Wt 104.3 lb

## 2021-12-27 VITALS — BP 135/72 | HR 96 | Temp 97.9°F | Ht 64.0 in | Wt 104.0 lb

## 2021-12-27 DIAGNOSIS — R609 Edema, unspecified: Secondary | ICD-10-CM

## 2021-12-27 DIAGNOSIS — R61 Generalized hyperhidrosis: Secondary | ICD-10-CM | POA: Insufficient documentation

## 2021-12-27 DIAGNOSIS — M1990 Unspecified osteoarthritis, unspecified site: Secondary | ICD-10-CM | POA: Insufficient documentation

## 2021-12-27 DIAGNOSIS — Z9886 Personal history of breast implant removal: Secondary | ICD-10-CM | POA: Insufficient documentation

## 2021-12-27 DIAGNOSIS — I350 Nonrheumatic aortic (valve) stenosis: Secondary | ICD-10-CM | POA: Insufficient documentation

## 2021-12-27 LAB — COMPREHENSIVE METABOLIC PANEL, NON-FASTING
ALBUMIN: 3.9 g/dL (ref 3.4–4.8)
ALKALINE PHOSPHATASE: 67 U/L (ref 55–145)
ALT (SGPT): 15 U/L (ref 8–22)
ANION GAP: 9 mmol/L (ref 4–13)
AST (SGOT): 24 U/L (ref 8–45)
BILIRUBIN TOTAL: 0.3 mg/dL (ref 0.3–1.3)
BUN/CREA RATIO: 21 (ref 6–22)
BUN: 16 mg/dL (ref 8–25)
CALCIUM: 8.9 mg/dL (ref 8.8–10.2)
CHLORIDE: 103 mmol/L (ref 96–111)
CO2 TOTAL: 28 mmol/L (ref 23–31)
CREATININE: 0.78 mg/dL (ref 0.60–1.05)
ESTIMATED GFR: 77 mL/min/BSA (ref >=60)
GLUCOSE: 90 mg/dL (ref 65–125)
POTASSIUM: 4.6 mmol/L (ref 3.5–5.1)
PROTEIN TOTAL: 7 g/dL (ref 6.0–8.0)
SODIUM: 140 mmol/L (ref 136–145)

## 2021-12-27 LAB — CBC WITH DIFF
BASOPHIL #: <0.1 10*3/uL (ref <=0.20)
BASOPHIL %: 1 %
EOSINOPHIL #: 0.3 10*3/uL (ref <=0.50)
EOSINOPHIL %: 6 %
HCT: 35.9 % (ref 34.8–46.0)
HGB: 11 g/dL — ABNORMAL LOW (ref 11.5–16.0)
IMMATURE GRANULOCYTE #: <0.1 10*3/uL (ref <0.10)
IMMATURE GRANULOCYTE %: 0 % (ref 0–1)
LYMPHOCYTE #: 1.04 10*3/uL (ref 1.00–4.80)
LYMPHOCYTE %: 22 %
MCH: 28.5 pg (ref 26.0–32.0)
MCHC: 30.6 g/dL — ABNORMAL LOW (ref 31.0–35.5)
MCV: 93 fL (ref 78.0–100.0)
MONOCYTE #: 0.52 10*3/uL (ref 0.20–1.10)
MONOCYTE %: 11 %
MPV: 9.7 fL (ref 8.7–12.5)
NEUTROPHIL #: 2.89 10*3/uL (ref 1.50–7.70)
NEUTROPHIL %: 60 %
PLATELETS: 202 10*3/uL (ref 150–400)
RBC: 3.86 10*6/uL (ref 3.85–5.22)
RDW-CV: 14.4 % (ref 11.5–15.5)
WBC: 4.8 10*3/uL (ref 3.7–11.0)

## 2021-12-27 LAB — URINALYSIS, MICROSCOPIC
RBCS: 5 /hpf (ref <6.0)
WBCS: 2 /hpf (ref <11.0)

## 2021-12-27 LAB — URINALYSIS, MACROSCOPIC
BILIRUBIN: NEGATIVE mg/dL
BLOOD: NEGATIVE mg/dL
COLOR: NORMAL
GLUCOSE: NEGATIVE mg/dL
KETONES: NEGATIVE mg/dL
NITRITE: NEGATIVE
PH: 6.5 (ref 5.0–8.0)
PROTEIN: NEGATIVE mg/dL
SPECIFIC GRAVITY: 1.025 (ref 1.005–1.030)
UROBILINOGEN: NEGATIVE mg/dL

## 2021-12-27 MED ORDER — OXYCODONE 10 MG TABLET
10.0000 mg | ORAL_TABLET | Freq: Three times a day (TID) | ORAL | 0 refills | Status: DC | PRN
Start: 2021-12-27 — End: 2022-01-08

## 2021-12-27 NOTE — Progress Notes (Signed)
INTERNAL MEDICINE   [facility]  Return Visit Progress Note     Name: Rachel [name] Date of Service: 12/27/2021   MRN:  [id number]  Age/DOB: 81 y.o., 1941/08/19 PCP:  [name], MD  Reason for Visit: Multiple medical problems follow up     Subjective:      Rachel [name] is a 81 y.o. 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    Patient was recently seen at the pain clinic where it was recommended that she start to wean her oxycodone. She is currently taking it 3x daily and it was recommended to reduce to 10/10/5 mg. She chose to talk to me before having pain management take over her regimen. She was having issues with hypoglycemia and memory impairment and so the pain clinic is hesitant to continue opiate medication. It was also recommended to reduce her robaxin dose to 250mg  daily. She was also referred to neurology and neurosx due to memory changes and symmetric hyperreflexia and these appointments are scheduled later this month. She also reports to me that she has had night sweats for the last 3 days but she stopped prempro 3 months ago and has been taking 10 mg of oxycodone 3x daily without starting any sort of taper.    We did an extensive med reconciliation in the office and took out any unnecessary medications. She is very hesitant to taper the oxycodone but eventually agreed to trying 10/10/5 mg with the intent to taper it over the next 3-6 months slowly. She understands our concern that he weight is down around 10 pounds from her prior baseline in [place] and understands that her metabolism of medications like opiates slows  significantly with age. She is also agreeable to have blood work drawn to ensure her kidney and liver function are stable.     She reports being very unhappy with the visit at the pain clinic and does not want to go back. She would rather that we manage her medication here in the office.     Patient also mentioned that she has had some leg swelling over the holidays which has never happened before. The swelling went up to her mid shin and took several days to go down. She admits to adding salt to all of her food and loving pretzels and salty snacks. She does feel short of breath most of the time, denies PND but uses 2 pillows to sleep at night. She does not have chest pain but does report near syncope when she gets up in the morning.      Medical History:      Past Medical History, Allergies, Surgical History, Family, and Social History were reviewed  on   12/27/2021 and updated as needed.     MEDICATIONS:  Outpatient Medications Marked as Taking for the 12/27/21 encounter (Office Visit) with [name], MD   Medication Sig    carboxymethylcellulose sodium (ARTIFICIAL TEARS, CMC, OPHT) Administer into affected eye(s) Twice daily    DEXILANT 60 mg Oral Cap, Delayed Rel., Multiphasic Take 1 Capsule (60 mg total) by mouth Once a day    diclofenac sodium (  VOLTAREN) 1 % Gel Apply topically Three times a day    DULoxetine (CYMBALTA DR) 60 mg Oral Capsule, Delayed Release(E.C.) Take 1 Capsule (60 mg total) by mouth Once a day    fexofenadine HCl (ALLEGRA ORAL) Take 180 mg by mouth Every morning    mometasone furoate (NASONEX NASL) Administer into affected nostril(s) Twice daily    Non-Adherent Bandage (TELFA) 3 X 8 " Bandage Apply Vaseline to wounds. Then, apply Telfa pad. Secure with gauze    Oxycodone (ROXICODONE) 10 mg Oral Tablet Take 1 Tablet (10 mg total) by mouth Every 8 hours as needed for Pain for up to 90 days Indications: pain, osteoarthritis    rOPINIRole (REQUIP) 4 mg Oral Tablet Take 1 Tablet (4  mg total) by mouth Twice daily for 30 days       Review of Systems:      Other than stated above all other ROS are negative.      Physical Exam:     Vitals:   height is 1.626 m (5\' 4" ) and weight is 47.2 kg (104 lb). Her temporal temperature is 36.6 C (97.9 F). Her blood pressure is 135/72 and her pulse is 96. Her oxygen saturation is 98%.      General:  Well-nourished, well-developed, in no apparent distress  Head:  Normocephalic, atraumatic  Eyes:  Pupils equally round, extraocular muscles intact, sclareae non-icteric, conjunctivae clear  Neck:  Trachea midline, no thyromegaly or lymphadenopathy  Cardiovascular:  Regular rate and rhythm, no murmurs, rubs, gallops; no edema in lower extremities BL  Respiratory:  Clear to auscultation bilaterally, no wheezes, rhonchi, crackles  Abdominal:  Bowel sounds normal; abdomen soft, non-tender to palpation  Skin:  Warm and dry  Neurological:  Awake, A&O x 3. CNII-XII, strength and sensation grossly intact  Psychiatric:  Normal mood & affect, thought process linear, speech content normal       Data Reviewed and Interpretation:     HEMOGLOBIN A1C  Lab Results   Component Value Date    HA1C 6.1 (H) 09/13/2021     COMPLETE BLOOD COUNT   Lab Results   Component Value Date    WBC 8.8 09/13/2021    HGB 11.6 09/13/2021    HCT 36.5 09/13/2021    PLTCNT 182 09/13/2021       DIFFERENTIAL  Lab Results   Component Value Date    PMNS 86 09/13/2021    MONOCYTES 4 09/13/2021    BASOPHILS 1 09/13/2021    BASOPHILS <0.10 09/13/2021    PMNABS 7.55 09/13/2021    LYMPHSABS 0.72 (L) 09/13/2021    EOSABS 0.10 09/13/2021    MONOSABS 0.35 09/13/2021     BASIC METABOLIC PANEL  Lab Results   Component Value Date    SODIUM 132 (L) 09/13/2021    POTASSIUM 4.3 09/13/2021    CHLORIDE 97 09/13/2021    CO2 27 09/13/2021    ANIONGAP 8 09/13/2021    BUN 21 09/13/2021    CREATININE 0.95 09/13/2021    BUNCRRATIO 22 09/13/2021    GFR 61 09/13/2021    CALCIUM 8.9 09/13/2021    GLUCOSENF 154 (H) 09/13/2021       COMPREHENSIVE METABOLIC PANEL FASTING  Lab Results   Component Value Date    SODIUM 132 (L) 09/13/2021    POTASSIUM 4.3 09/13/2021    CHLORIDE 97 09/13/2021    CO2 27 09/13/2021    ANIONGAP 8 09/13/2021    BUN 21 09/13/2021    CREATININE 0.95 09/13/2021  CALCIUM 8.9 09/13/2021    ALBUMIN 3.8 09/13/2021    TOTALPROTEIN 7.2 09/13/2021    ALKPHOS 50 (L) 09/13/2021    AST 22 09/13/2021    ALT 15 09/13/2021     COMPREHENSIVE METABOLIC PANEL - NON FASTING  Lab Results   Component Value Date    SODIUM 132 (L) 09/13/2021    POTASSIUM 4.3 09/13/2021    CHLORIDE 97 09/13/2021    CO2 27 09/13/2021    ANIONGAP 8 09/13/2021    BUN 21 09/13/2021    CREATININE 0.95 09/13/2021    GLUCOSENF 154 (H) 09/13/2021    CALCIUM 8.9 09/13/2021    ALBUMIN 3.8 09/13/2021    TOTALPROTEIN 7.2 09/13/2021    ALKPHOS 50 (L) 09/13/2021    AST 22 09/13/2021    ALT 15 09/13/2021     GLUCOSE  Lab Results   Component Value Date    GLUCOSENF 154 (H) 09/13/2021     THYROID STIMULATING HORMONE  No results found for: TSH, [id number]         Assessment:      APOLONIA ELLWOOD is a 81 y.o. 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, migraines, recurrent sinus infections who presents to the clinic to discuss pain management         Plan:     (R61) Night sweats  (primary encounter diagnosis)  Plan: CBC/DIFF, Comp Metabolic Panel Nonfasting  If CBC normal, will try to have dexcom or freestyle approved as patient has had several syncopal episodes for hypoglycemia  Doubt that her night sweats are from withdrawal of HRT as she has been off of them for 3 months at this point  Eat 6 small meals daily to avoid hypoglycemia, grab bars    (R60.9) Edema, unspecified type  Plan: URINALYSIS, MACROSCOPIC AND MICROSCOPIC         W/CULTURE REFLEX, TRANSTHORACIC ECHOCARDIOGRAM         - ADULT  Check UA for proteinuria, check renal function with CMP, echo    (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    Follow Up:       Return to clinic in 3 months      Patient had no questions regarding treatment plan, goals, risks or benefits and agrees to contact me or my clinic in the interim should any questions or problems arise.    [name], MD

## 2021-12-27 NOTE — Progress Notes (Signed)
[facility]  [place] [address]  Operated by [place]       Name: Rachel [name] MRN:  [id number]   Date: 12/27/21 Age: 81 y.o.         POST-OP VISIT     HPI: Rachel [name] is a 81 y.o. White female who is presenting for a post-op visit. She is S/P removal of bilateral breast implants on 10/23/2021. She reports no concerns today. She is having less back pain and ADL's are easier. She did not expect the amount of excess skin that she has, but she is happy with the overall results. She denies pain, fever, chills, and shortness of breath.       REVIEW OF SYSTEMS  + as per HPI.     PHYSICAL EXAM  Vitals: BP (!) 158/76   Pulse (!) 103   Temp 36.9 C (98.4 F)   Ht 1.626 m (5\' 4" )   Wt 47.3 kg (104 lb 4.4 oz)   SpO2 97%   BMI 17.90 kg/m        General: alert, no distress  Head: normocephalic and atraumatic  Eyes:  Conjunctiva clear  ENT:  ENMT without erythema or injection  Respiratory:  Non-labored respirations  Chest: Bilateral breast flaps well-perfused, incisions clean dry and intact, no surrounding erythema, no drainage, non-tender, no palpable collections  Neuro: alert and oriented x 3  Vascular: well perfused   Skin: warm and dry      ASSESSMENT:  81 y.o. S/P removal of bilateral breast implants on 10/23/2021. She is healing as expected.     PLAN:    - No further intervention is needed.   - Ok to wear bra of choice.   - Return to full activity.  - No need to follow up. RTC with any concerns.     Patient was seen as a shared visit with Dr. Clarise Cruz who determined the assessment and plan. The patient was given the chance to ask all of their questions, and these were answered to their satisfaction.    Jeremy Johann, FNP-C  12/27/2021, 14:12    I personally saw and evaluated the patient. See mid-level's note for additional details. My findings/participation are plan as noted above.    Redgie Grayer, MD

## 2021-12-28 LAB — URINE CULTURE: URINE CULTURE: NO GROWTH

## 2021-12-29 ENCOUNTER — Ambulatory Visit (HOSPITAL_BASED_OUTPATIENT_CLINIC_OR_DEPARTMENT_OTHER): Payer: Self-pay | Admitting: Internal Medicine

## 2021-12-29 ENCOUNTER — Other Ambulatory Visit (HOSPITAL_BASED_OUTPATIENT_CLINIC_OR_DEPARTMENT_OTHER): Payer: Self-pay | Admitting: Internal Medicine

## 2021-12-29 NOTE — Telephone Encounter (Signed)
Faxed back completed/signed [employer] auth for Oxycodone to [employer]. Confirmation received. Placing to be scanned.  Waylan Rocher, MA  12/29/2021, 15:26

## 2021-12-29 NOTE — Telephone Encounter (Signed)
Prior authorization request for: oxycodone hcl 10 mg  Insurance : [employer]   ID# : [id number]  Insurance # : [id number]    Placing in [facility] box for completion to initiate prior auth process.  Kela Millin, [place]  12/29/2021, 14:21

## 2021-12-29 NOTE — Telephone Encounter (Signed)
Spoke with patient to follow up on how she is doing with tapering her oxycodone. She says she was able to take a half tablet (5mg ) this morning and it was ok and she was able to make it to her next dose and took a full tablet. She feels more alert and understands that the dose needs to be titrated because she has also lost weight and is metabolizing medications much more slowly. Leg swelling has improved with compression hose and avoiding salty foods.

## 2022-01-01 NOTE — Telephone Encounter (Signed)
Filled out new form for [employer] as [employer] does not handle coverage for this patient. Placing new from in [facility] box for completion. Rachel [name], [place]  01/01/2022, 10:44

## 2022-01-06 ENCOUNTER — Encounter (HOSPITAL_BASED_OUTPATIENT_CLINIC_OR_DEPARTMENT_OTHER): Payer: Self-pay | Admitting: Internal Medicine

## 2022-01-08 ENCOUNTER — Other Ambulatory Visit: Payer: Self-pay

## 2022-01-08 ENCOUNTER — Ambulatory Visit: Payer: Medicare PPO | Attending: Internal Medicine | Admitting: Internal Medicine

## 2022-01-08 ENCOUNTER — Encounter (HOSPITAL_BASED_OUTPATIENT_CLINIC_OR_DEPARTMENT_OTHER): Payer: Self-pay | Admitting: Internal Medicine

## 2022-01-08 ENCOUNTER — Ambulatory Visit (INDEPENDENT_AMBULATORY_CARE_PROVIDER_SITE_OTHER): Payer: Medicare PPO | Admitting: Clinical Neuropsychologist

## 2022-01-08 ENCOUNTER — Ambulatory Visit (HOSPITAL_BASED_OUTPATIENT_CLINIC_OR_DEPARTMENT_OTHER): Payer: Self-pay | Admitting: Internal Medicine

## 2022-01-08 VITALS — BP 120/72 | HR 93 | Temp 97.0°F | Ht 64.06 in | Wt 104.1 lb

## 2022-01-08 DIAGNOSIS — G43909 Migraine, unspecified, not intractable, without status migrainosus: Secondary | ICD-10-CM | POA: Insufficient documentation

## 2022-01-08 DIAGNOSIS — Z9189 Other specified personal risk factors, not elsewhere classified: Secondary | ICD-10-CM | POA: Insufficient documentation

## 2022-01-08 DIAGNOSIS — M1990 Unspecified osteoarthritis, unspecified site: Secondary | ICD-10-CM | POA: Insufficient documentation

## 2022-01-08 DIAGNOSIS — M79676 Pain in unspecified toe(s): Secondary | ICD-10-CM | POA: Insufficient documentation

## 2022-01-08 NOTE — Telephone Encounter (Signed)
Regarding: rx questions  ----- Message from Mearl Latin sent at 01/05/2022  4:36 PM EST -----  [name], MD     Pt is calling in about headaches she has been having. She has had migraines for 7 days now. She is asking about a med she used to be on for her headaches rOPINIRole (REQUIP) 4 mg Oral Tablet    . She is also asking about a med she was told to stop taking propranoloL (INDERAL) 60 mg Oral Tablet... also asking about rx METHOCARBAMOL ORAL. Please call pt to advise. She is wanting to be back on these meds

## 2022-01-08 NOTE — Progress Notes (Signed)
podiatry      INTERNAL MEDICINE   [facility]  Return Visit Progress Note     Name: Rachel [name] Date of Service: 01/08/2022   MRN:  [id number]  Age/DOB: 81 y.o., 1941-04-19 PCP:  [name], MD  Reason for Visit: Medication Adjustment     Subjective:      Rachel [name] is a 81 y.o. 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    Last visit we discussed that Magaline is at risk for polypharmacy and she was experiencing dizziness when getting up in the morning or at night to use the bathroom. She was telling me that she feels like she is going to pass out frequently and over the summer was actually syncopizing while gardening for example. We thought this might have something to do with her blood sugar and I wanted to arrange a CGM for her. A family friend who is a physician accompanied her to the last visit and reiterated some of our concerns because her weight has dropped around 10-15 pounds since she lived in [place] and so we discussed that she is likely more sensitive to her medications and metabolizes them more slowly.      Today she presents because she would like to restart her medications for headache (propranolol), methocarbamol and continue her oxycodone at the same dose. She is also asking about taking estrogens even though we have discussed the risk of clots, stroke and cancer on more than 3 occasions. We decided to trial premarin for vaginal dryness and also discussed that estrogen is not indicated for osteoarthritis or hot flashes especially in patient's over 65.     Neck pain - previous images showed that everything was normal and the plates were in place, op date was 12/30/20 and imaging was about 60 days after that   botox injections ideally, this was to be arranged in [place] but the patient moved shortly thereafter  She is asking about  Fioricet, last had an 8 pill rx in [place] and it took her about a year to use them    Shortness of breath with headaches  Chest pain of left breast around 6p in the evening, right breast felt ok until yesterday  When she massages the area or holds it the pain improves  Has not looked at it so has not noticed swelling or redness, does not look infected. Pain first started after her implants were removed.     Medical History:      Past Medical History, Allergies, Surgical History, Family, and Social History were reviewed  on   01/08/2022 and updated as needed.     MEDICATIONS:  Outpatient Medications Marked as Taking for the 01/08/22 encounter (Office Visit) with [name], MD   Medication Sig    carboxymethylcellulose sodium (ARTIFICIAL TEARS, CMC, OPHT) Administer into affected eye(s) Twice daily    DEXILANT 60 mg Oral Cap, Delayed Rel., Multiphasic Take 1 Capsule (60 mg total) by mouth Once a day    diclofenac sodium (VOLTAREN) 1 % Gel Apply topically Three times a day    DULoxetine (CYMBALTA DR) 60 mg Oral Capsule, Delayed Release(E.C.) Take 1 Capsule (60 mg total) by mouth Once a day    fexofenadine HCl (ALLEGRA ORAL) Take 180 mg by mouth Every morning    mometasone furoate (NASONEX NASL) Administer into affected nostril(s) Twice daily  Non-Adherent Bandage (TELFA) 3 X 8 " Bandage Apply Vaseline to wounds. Then, apply Telfa pad. Secure with gauze    Oxycodone (ROXICODONE) 10 mg Oral Tablet Take 1 Tablet (10 mg total) by mouth Every 8 hours as needed for Pain for up to 90 days Indications: pain, osteoarthritis    rOPINIRole (REQUIP) 4 mg Oral Tablet Take 1 Tablet (4 mg total) by mouth Twice daily for 30 days       Review of Systems:      Other than stated above all other ROS are negative.      Physical Exam:     Vitals:   height is 1.627 m (5' 4.06") and weight is 47.2 kg (104 lb 0.9 oz). Her temporal temperature is 36.1 C (97 F). Her blood pressure is 120/72 and her pulse is 93. Her oxygen  saturation is 98%.      General:  Well-nourished, well-developed, in no apparent distress  Head:  Normocephalic, atraumatic  Eyes:  Pupils equally round, extraocular muscles intact, sclareae non-icteric, conjunctivae clear  Neck:  Trachea midline, no thyromegaly or lymphadenopathy  Breast: no erythema, swelling, pain is reproducible to palpation, no masses palpated  Cardiovascular:  Regular rate and rhythm, no murmurs, rubs, gallops; no edema in lower extremities BL  Respiratory:  Clear to auscultation bilaterally, no wheezes, rhonchi, crackles  Abdominal:  Bowel sounds normal; abdomen soft, non-tender to palpation  Skin:  Warm and dry  Neurological:  Awake, A&O x 3. CNII-XII, strength and sensation grossly intact  Psychiatric:  Normal mood & affect, thought process linear, speech content normal       Data Reviewed and Interpretation:     N/a         Assessment:      [name] is a 81 y.o. 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 She has a f/u appt with neurology tomorrow.    (G43.909) Migraine  Plan: Butalbital-Acetaminophen-Caff 50-300-40 mg Oral        Capsule  Ideally would start botox injections for tension type headaches, for the occasional migraine she can use Fioricet PRN but advised not to take with tylenol  We also discussed that she could be having medication overuse headaches due to her opiate prescription and [place] trying to taper her to q8h last visit.     (M19.90) Osteoarthritis, unspecified osteoarthritis type, unspecified site  Plan: Oxycodone (ROXICODONE) 10 mg Oral Tablet  Discussed that she should currently be taking 10 mg 3x daily and  5mg  nightly  Goal is to taper over the next few months, patient reports she is agreeable to this but would also like to have possible botox injections set up to help with her neck pain and migraines    (M79.676) Toe pain  Plan: Refer to [facility] Maintenance:     Not discussed at today's visit    Follow Up:       Return to clinic in 3 months      Patient had no questions regarding treatment plan, goals, risks or benefits and agrees to contact me or my clinic  in the interim should any questions or problems arise.    [name], MD

## 2022-01-08 NOTE — Telephone Encounter (Signed)
Tried to call the patient to advise coming in for an appointment with Dr. Megan Salon to discuss medication requests. Patient did not answer. Left a message requesting a call back to [phone].  Corbin Ade, RN  01/08/2022, 08:22

## 2022-01-08 NOTE — Patient Instructions (Signed)
Call Rachel [name] at [phone] for questions about Libre blood sugar device.

## 2022-01-08 NOTE — Telephone Encounter (Signed)
[name], MD  You 41 minutes ago (8:51 AM)     NC  We just had a visit about her medications and she wanted to stop all of them and I agreed due to side effects. We can also do a telephone visit to discuss again.           Sent patient a MyChart message offering telephone appointment.  Corbin Ade, RN  01/08/2022, 09:35'

## 2022-01-09 ENCOUNTER — Encounter (INDEPENDENT_AMBULATORY_CARE_PROVIDER_SITE_OTHER): Payer: Self-pay | Admitting: Student in an Organized Health Care Education/Training Program

## 2022-01-09 ENCOUNTER — Telehealth (HOSPITAL_BASED_OUTPATIENT_CLINIC_OR_DEPARTMENT_OTHER): Payer: Self-pay | Admitting: Internal Medicine

## 2022-01-09 ENCOUNTER — Ambulatory Visit
Payer: Medicare PPO | Attending: NURSE PRACTITIONER-ADULT HEALTH | Admitting: Student in an Organized Health Care Education/Training Program

## 2022-01-09 VITALS — BP 152/57 | HR 106 | Ht 64.0 in | Wt 109.1 lb

## 2022-01-09 DIAGNOSIS — G4440 Drug-induced headache, not elsewhere classified, not intractable: Secondary | ICD-10-CM | POA: Insufficient documentation

## 2022-01-09 DIAGNOSIS — G43709 Chronic migraine without aura, not intractable, without status migrainosus: Secondary | ICD-10-CM | POA: Insufficient documentation

## 2022-01-09 DIAGNOSIS — R413 Other amnesia: Secondary | ICD-10-CM | POA: Insufficient documentation

## 2022-01-09 DIAGNOSIS — G4486 Cervicogenic headache: Secondary | ICD-10-CM | POA: Insufficient documentation

## 2022-01-09 MED ORDER — BUTALBITAL-ACETAMINOPHEN-CAFFEINE 50 MG-300 MG-40 MG CAPSULE
1.0000 | ORAL_CAPSULE | Freq: Every day | ORAL | 0 refills | Status: DC | PRN
Start: 2022-01-09 — End: 2022-03-21

## 2022-01-09 MED ORDER — SUMATRIPTAN 100 MG TABLET
100.0000 mg | ORAL_TABLET | Freq: Once | ORAL | 0 refills | Status: AC | PRN
Start: 2022-01-09 — End: 2022-01-20

## 2022-01-09 MED ORDER — TOPIRAMATE 25 MG TABLET
25.0000 mg | ORAL_TABLET | Freq: Two times a day (BID) | ORAL | 0 refills | Status: DC
Start: 2022-01-09 — End: 2022-01-10

## 2022-01-09 MED ORDER — TOPIRAMATE 100 MG TABLET
100.0000 mg | ORAL_TABLET | Freq: Two times a day (BID) | ORAL | 3 refills | Status: DC
Start: 2022-01-30 — End: 2022-01-10

## 2022-01-09 MED ORDER — OXYCODONE 10 MG TABLET
10.0000 mg | ORAL_TABLET | Freq: Four times a day (QID) | ORAL | 0 refills | Status: DC | PRN
Start: 2022-01-09 — End: 2022-02-13

## 2022-01-09 MED ORDER — CYCLOBENZAPRINE 5 MG TABLET
5.0000 mg | ORAL_TABLET | Freq: Three times a day (TID) | ORAL | 0 refills | Status: DC | PRN
Start: 2022-01-09 — End: 2022-02-13

## 2022-01-09 NOTE — Telephone Encounter (Signed)
Received fax stating Patients Oxycodone HCl was denied. Diagnosis is required to review request. I will place this in your [facility] mailbox for review.  Waylan Rocher, MA  01/09/2022, 16:01

## 2022-01-09 NOTE — Telephone Encounter (Signed)
[name], MD  You Just now (1:34 PM)     NC  Disregard, saw patient yesterday. thanks      You  [name], MD 4 hours ago (9:35 AM)     KF  Do you still want her to do a telephone visit since she saw you yesterday?           [name], RN  01/09/2022, 13:35

## 2022-01-09 NOTE — Patient Instructions (Signed)
What Can You Do For Your Headaches    The management of headache disorders consists of many factors. Medications are only part of the solution, and alone will be limited in effectiveness. There are many other factors that will be your responsibility to manage. It will likely only be through our combined efforts that you will achieve satisfactory improvement in your head pain and quality of life. Below are some suggestions of how you can help yourself and help the [facility] help you.  Unless told otherwise, you should discontinue the use of any over-the-counter (OTC) medication that you use for headaches. OTC's can quickly backfire and lead to increased frequency of headaches, even when used only once or twice per week.  Unless told otherwise, you should discontinue the use of any prescription pain medication such as opiates (narcotics) or fiorinal/fioricet for the same reason as above.  Getting regular, refreshing sleep is very important. Set a specific bedtime that would allow for 8 to 8  hours of sleep. Do not watch television, listen to the radio, do work, or argue in bed. If your bed partner snores, wear earplugs or sleep in another room. Have the same bedtime on the weekend as you have during the week.  Eat a well-balanced and healthy diet. Avoid common food triggers like Monosodium Glutamate (MSG) and Nitrites. These are commonly found in snack foods like potato chips, hot dogs, bacon, and other processed meats. Decrease your intake of red meat and increase chicken or fish. Drink 6-8 glasses of water per day.  Get regular exercise. A brisk walk for 30 minutes 3-4 days per week is enough at the beginning. Improving your heart health and improving your blood flow will have beneficial effects on your headaches.  Eliminate Caffeine from your diet. Caffeine is one of the worst chemicals for anyone with headaches. Even only one cup per day can be enough to increase the frequency or severity of your headaches.  Be aware that even decaffeinated coffee and tea have some caffeine in them.  Find someone with whom you can talk about your headaches and other stresses or concerns. This could be a religious leader, professional counselor, psychologist, or even just a good friend. It is important to be able to talk openly and honestly about what you are going through.  Begin a stress reduction program with meditation, prayer, or biofeedback. Many resources for biofeedback and relaxation are available on the internet.  Being involved in your regular activities despite the headaches is necessary. Make every effort to go to work, school, or social activities even if you have a headache. You cannot let your headaches control you - you must control them and not allow them to dictate to you how you will live.  Avoid "Natural" cures for your headache unless you discuss them with the [facility] first. Many products claiming to cure or treat headaches can actually be detrimental to your headaches.

## 2022-01-09 NOTE — H&P (Signed)
[place] Department of Neurology      Operated by [facility]  Outpatient History and Physical    Date:  01/09/2022  Name: Rachel [name]  MRN: [id number]  Age:  81 y.o.  Referring Physician: Vergie Living, NP  [place]  [place],  [place] [address]    Consult: Yes    PCP:  [name], MD    CC:    Chief Complaint   Patient presents with   . Memory Problem       History Obtained from:  Patient     HPI:     SHANEYA [name] is a 81 y.o., female who presents to the Neurology clinic for headache, neck pain. Her PMH is significant for arthritis.     Headache and confusion   First headache was at the age of 81. And had sinus headaches ever since. About 2 years ago, started having problems of dizziness, vision , which appear to be associated with headache.     Reports difficulty to concentrate, reports vision changes, was seen by ophthalmologist and started glasses. Stumbles when walk.     The nature of headache since onset is same, except neck pain.   It would either with sinus component or occipital in location. 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     Dizziness and vision problem also started about 2 years ago, was gradual in the beginning. Vision problem persisted despite presscription glasses. Reports blurriness. Blurriness is associated with headache. Starts having trouble with reading news on television.       Headache Characteristics: ?  . ??Headache days per month: 15 ; 7-8  of them are severe.   . Individual headaches typically last at least 4 hours with hot compressors, current   . They are located at bifrontal/sinus and occipital  ,  . Overall severity:   10/10 when worse, at average 5/10   . Quality:   Crushing and squeezing in nature  . Associated features include: Photophobia, phonophobia, osmophobia,  worse with activity. Vision  changes are reported as blurry vision. No pulsatile tinnitus.  . Autonomic features?: sob  . ?Aura: visual, sensory, language or motor: none, slowly builds   . Headache triggers: none   . Headache relieved by: hot compressors. Lays down in the dark room.   Marland Kitchen Headache associations: bending down makes it worse, has passed out and fallen as well.No trigger with valsalva maneuvers  . Headache behavior: retreats to a dark room  . Menstrual association:  . Social History: married, family,retired  . Smoker / Nonsmoker, social alcohol.: none   . Sleep: fragmented and short, about 4 hours, takes oxycodone and then sleep again for 3 hours   . Diet/ Caffeine Use: Normal? ; 1-2 cup/ day   . Association with time of day: mornings and after   . Hx of head trauma: none   . Prior images (CT, MRI): none   . Pierron of migraine: none     Current Headache Treatments/ Medications:tylenol      Other Related Medications: oxycodone for sever arthritis, Cymbalta, Firoecet in the past     Past Headache/ Other Treatments/ Meds/ Adverse Events/ Relevant Allergies:   Propranalol (helped with headache)      Tingling in the both hands. For the both hands. After shoulder surgery  Past Medical History:    Past Medical History:   Diagnosis Date   . Depression    . GERD (gastroesophageal reflux disease)    . Low back pain    . Neck problem    . Osteoarthritis    . Osteoarthritis    . Wears glasses        Medications:   Outpatient Medications Marked as Taking for the 01/09/22 encounter (Office Visit) with Arville Go, MD   Medication Sig   . carboxymethylcellulose sodium (ARTIFICIAL TEARS, CMC, OPHT) Administer into affected eye(s) Twice daily   . DEXILANT 60 mg Oral Cap, Delayed Rel., Multiphasic Take 1 Capsule (60 mg total) by mouth Once a day   . diclofenac sodium (VOLTAREN) 1 % Gel Apply topically Three times a day   . DULoxetine (CYMBALTA DR) 60 mg Oral Capsule, Delayed Release(E.C.) Take 1 Capsule (60 mg total) by mouth Once a day   .  fexofenadine HCl (ALLEGRA ORAL) Take 180 mg by mouth Every morning   . mometasone furoate (NASONEX NASL) Administer into affected nostril(s) Twice daily   . naloxone (NARCAN) 4 mg per spray nasal spray 1 Spray by INTRANASAL route Every 2 minutes as needed for actual or suspected opioid overdose. Call 911 if used.   . Non-Adherent Bandage (TELFA) 3 X 8 " Bandage Apply Vaseline to wounds. Then, apply Telfa pad. Secure with gauze   . rOPINIRole (REQUIP) 4 mg Oral Tablet Take 1 Tablet (4 mg total) by mouth Twice daily for 30 days   . [DISCONTINUED] Oxycodone (ROXICODONE) 10 mg Oral Tablet Take 1 Tablet (10 mg total) by mouth Every 8 hours as needed for Pain for up to 90 days Indications: pain, osteoarthritis       Allergies:   Allergies   Allergen Reactions   . Latex    . Darvon [Propoxyphene] Mental Status Effect     Hallucinations   . Shellfish Derived        Family History:   Family Medical History:     Problem Relation (Age of Onset)    No Known Problems Mother, Father          Surgical History:   Past Surgical History:   Procedure Laterality Date   . ANTERIOR FUSION CERVICAL SPINE  12/30/2020   . FOOT SURGERY Bilateral    . HX BREAST AUGMENTATION Bilateral     x3, 1974, 1994, 2014   . HX CATARACT REMOVAL Bilateral 01/12/2021   . HX ROTATOR CUFF REPAIR Right    . SACROILIAC JOINT INJECTION     . TOE AMPUTATION         Social History:    Social History     Socioeconomic History   . Marital status: Married   Tobacco Use   . Smoking status: Never   . Smokeless tobacco: Never   Vaping Use   . Vaping Use: Never used   Substance and Sexual Activity   . Alcohol use: Not Currently   . Drug use: Not Currently     Frequency: 5.0 times per week     Types: Marijuana     Comment: medical marijuana gel   Other Topics Concern   . Ability to Walk 1 Flight of Steps without SOB/CP Yes   . Routine Exercise No   . Ability To Do Own ADL's Yes   . Uses Walker No   . Other Activity Level Yes     Comment: light house work   . Uses [employer]  Yes        Review of Systems  Constitutional- No fever  Eyes- No visual change  ENT- Hearing normal  CV- No chest pain  Resp- No Shortness of breath  GI- No diarrhea  GU- Bladder normal  MS- No Arthritis  Skin- No rash  Psych- No depression  Endo- No DM  Heme- No nodes      PHYSICAL EXAM:    BP (!) 152/57   Pulse (!) 106   Ht 1.626 m (5\' 4" )   Wt 49.5 kg (109 lb 2 oz)   BMI 18.73 kg/m         Appearance: No Acute Distress  Ophthalmoscopic: No obvious papilledema, optic discs incompletely visualized owing to non dilated pupils   Carotid/Heart/Peripheral Vascular: , no peripheral edema, RRR  Mental status:  Orientation: Awake,Alert, and Oriented x 3  Memory: Normal  Attention: Normal  Knowledge: Appropriate  Language: No aphasia  Speech: No dysarthria  Cranial Nerves:  2 No Visual Defect on Confrontation; Pupils round, equal, reactive to light  3,4,6 Extraocular Movements Intact; no nystagmus  5 Facial Sensation Intact  7 No facial asymmetry  8 Intact hearing  9,10 Palate symmetric  11 Good shoulder shrug  12 Tongue Midline  Gait: Stable, No ataxia  Coordination: No ataxia with finger to nose testing   Sensory: Intact, Symmetric to Pinprick, Light Touch  Muscle Tone: Normal  Muscle exam  Arm Right Left Leg Right Left   Deltoid 5/5 5/5 Iliopsoas 5/5 5/5   Biceps 5/5 5/5 Quads 5/5 5/5   Triceps 5/5 5/5 Hamstrings 5/5 5/5   Wrist Extension 5/5 5/5 Ankle Dorsi Flexion 5/5 5/5   Wrist Flexion 5/5 5/5 Ankle Plantar Flexion 5/5 5/5   Interossei 5/5 5/5 AnkleEversion 5/5 5/5   APB 5/5 5/5 Ankle Inversion 5/5 5/5     Reflexes   RJ BJ TJ KJ AJ Plantars Hoffman's   Right 2+ 2+ 2+ 2+ 2+ Downgoing Notpresent   Left 2+ 2+ 2+ 2+ 2+ Downgoing Not present           Outside records: None       Assessment/Plan:     ICD-10-CM    1. Memory deficit  R41.3         No orders of the defined types were placed in this encounter.      [name], 81 y.o., with PMH of arthritis is presented for headache with migrainous nature. In addition  reports taking ocycodone q8H daily which could be contributing to her daily headache.     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     Flexeril for muscle relaxation   PT for neck muscles   MRI brain w/wo to assess for central causes of headache given her age.       RTC 75months.     Arville Go, MD     Arville Go, MD     ---------------------------------------    Late entry from 01/09/2022. I saw and examined the patient.  I reviewed the resident's note.  I agree with the findings and plan of care as documented in the resident's note.  Any exceptions/additions are  edited/noted.    Likely multifactorial headache component, check MRI brain. Discussed treatment options and patient was agreeable. We did discuss using triptans with caution in the elderly.    Salomon Fick, DO  01/11/2022, 08:19  Sports Neurology  [place]

## 2022-01-10 ENCOUNTER — Ambulatory Visit (HOSPITAL_BASED_OUTPATIENT_CLINIC_OR_DEPARTMENT_OTHER): Payer: Self-pay | Admitting: Internal Medicine

## 2022-01-10 ENCOUNTER — Ambulatory Visit (INDEPENDENT_AMBULATORY_CARE_PROVIDER_SITE_OTHER): Payer: Self-pay | Admitting: Student in an Organized Health Care Education/Training Program

## 2022-01-10 MED ORDER — TOPIRAMATE 25 MG TABLET
25.0000 mg | ORAL_TABLET | Freq: Every day | ORAL | 0 refills | Status: DC
Start: 2022-01-10 — End: 2022-02-16

## 2022-01-10 MED ORDER — TOPIRAMATE 100 MG TABLET
100.0000 mg | ORAL_TABLET | Freq: Every day | ORAL | 3 refills | Status: DC
Start: 2022-01-31 — End: 2022-02-16

## 2022-01-10 NOTE — Telephone Encounter (Signed)
Updated the order.

## 2022-01-10 NOTE — Telephone Encounter (Signed)
Received fax from [employer] stating they gave patient a temporary 31 day supply for butalb-acetamin-caff 50-300-40. They will not continue to pay for this unless a formulary exception has been done. Placing letter in [facility] box for review. Kela Millin, [place]  01/10/2022, 14:09

## 2022-01-10 NOTE — Telephone Encounter (Signed)
Pharmacy called and stated that the directions on the topamax needs clarify   States twice a day for 84 DOSES

## 2022-01-10 NOTE — Telephone Encounter (Signed)
Faxed back completed/signed denial for Oxycodone to [facility]. Confirmation received. Placing to be scanned.  Waylan Rocher, MA  01/10/2022, 09:57

## 2022-01-11 ENCOUNTER — Ambulatory Visit (INDEPENDENT_AMBULATORY_CARE_PROVIDER_SITE_OTHER): Payer: Self-pay | Admitting: Student in an Organized Health Care Education/Training Program

## 2022-01-11 ENCOUNTER — Encounter (HOSPITAL_COMMUNITY): Payer: Self-pay

## 2022-01-11 NOTE — Telephone Encounter (Signed)
Prior auth forms in your box for sumatriptan and cyclobenzaprine

## 2022-01-11 NOTE — Addendum Note (Signed)
Addended by: Salomon Fick on: 01/11/2022 08:20 AM     Modules accepted: Level of Service

## 2022-01-11 NOTE — Telephone Encounter (Signed)
[facility]  [place], [place] [address]     PATIENT NAME: Rachel [name]   MRN: [id number]  DOB: [birth year]     1.19.2023  10:26    Received a fax from [facility] in regards to the patient's Oxycodone prescription being denied. Placing to be scanned for review.    Clayton Lefort, BSN RN  Clinical Nurse Coordinator

## 2022-01-12 ENCOUNTER — Ambulatory Visit: Payer: Medicare PPO | Attending: Ophthalmology | Admitting: Ophthalmology

## 2022-01-12 ENCOUNTER — Encounter (INDEPENDENT_AMBULATORY_CARE_PROVIDER_SITE_OTHER): Payer: Self-pay | Admitting: Ophthalmology

## 2022-01-12 ENCOUNTER — Other Ambulatory Visit: Payer: Self-pay

## 2022-01-12 ENCOUNTER — Ambulatory Visit (HOSPITAL_BASED_OUTPATIENT_CLINIC_OR_DEPARTMENT_OTHER)
Admission: RE | Admit: 2022-01-12 | Discharge: 2022-01-12 | Disposition: A | Discharge status: Home / Self Care | Payer: Medicare PPO | Source: Ambulatory Visit

## 2022-01-12 ENCOUNTER — Encounter (HOSPITAL_COMMUNITY): Payer: Self-pay

## 2022-01-12 DIAGNOSIS — H53453 Other localized visual field defect, bilateral: Secondary | ICD-10-CM

## 2022-01-12 DIAGNOSIS — H04123 Dry eye syndrome of bilateral lacrimal glands: Secondary | ICD-10-CM

## 2022-01-12 DIAGNOSIS — H538 Other visual disturbances: Secondary | ICD-10-CM | POA: Insufficient documentation

## 2022-01-12 DIAGNOSIS — H547 Unspecified visual loss: Secondary | ICD-10-CM | POA: Insufficient documentation

## 2022-01-12 NOTE — Progress Notes (Cosign Needed Addendum)
[name]  [place] [id number]  Operated by [facility]         Patient Name: Rachel [name]  MRN#: [id number]  Birthdate: 06/30/1941    Date of Service: 01/12/2022    Chief Complaint    Blurred Vision         Rachel [name] is a 81 y.o. female who presents today for evaluation/consultation of:  HPI     Blurred Vision    In both eyes.           Comments    Pt here today for f/u Blurred vision, bilateral   Pt c/o increased blurred/hazy vision OU. Pt c/o trouble focusing especially while watching TV  Pt notes trouble reading at near recently   Pt denies any eye pain. Pt c/o dryness OU, using AT PRN   Pt denies any new floaters or FOL           Last edited by Claire Shown, [place] on 01/12/2022  3:21 PM.        ROS    Positive for: Eyes  Negative for: Constitutional, Gastrointestinal, Neurological, Skin, Genitourinary, Musculoskeletal, HENT, Endocrine, Cardiovascular, Respiratory, Psychiatric, Allergic/Imm, Heme/Lymph  Last edited by Claire Shown, [place] on 01/12/2022  3:21 PM.         All other systems Negative    Claire Shown, COA  01/12/2022, 15:21    Base Eye Exam     Visual Acuity (Snellen - Linear)       Right Left    Dist cc 20/20 -1 20/20    Correction: Glasses   Pt noted vision was hazy            Tonometry (icare, 3:26 PM)       Right Left    Pressure 13 13          Pupils       Shape React APD    Right Round Brisk None    Left Round Brisk None          Visual Fields (Counting fingers)       Right Left     Full Full          Extraocular Movement       Right Left     Full Full          Neuro/Psych     Oriented x3: Yes    Mood/Affect: Normal            Slit Lamp and Fundus Exam     External Exam       Right Left    External Normal Normal          Slit Lamp Exam       Right Left    Lids/Lashes Normal Normal    Conjunctiva/Sclera White and quiet White and quiet    Cornea tr inf pee inf pee    Anterior Chamber Deep and quiet Deep and quiet    Iris Round and  reactive Round and reactive    Lens multifocal toric, open PC multifocal toric, open PC    Anterior Vitreous pvd Normal            Refraction     Wearing Rx       Sphere Cylinder Axis Add    Right [place] +0.50 120 +2.75    Left -0.25 +1.25 175 +2.75    Type: PAL  MD Addition to HPI:  Feels that blurry vision is worse.  Hx of migraine, no fever, chills, jaw claudication, recent drastic weight change.     ENCOUNTER DIAGNOSES     ICD-10-CM   1. Blurred vision, bilateral  H53.8   2. Peripheral vision loss, bilateral  H53.453   3. Visual impairment  H54.7     No orders of the defined types were placed in this encounter.      Ophthalmic Plan of Care:    1. Blurred vision/Dry eyes OU  -Surface improved with minimal PEE, and improved VA OU  -20/20 VA OU. Reports no glares or halos  -no GCA symptoms, no APD  -VA today worse from prior with enlarged BS and cocencentral involvement, will refer to neuro-oph; patient to get MR brain wwo in a couple weeks; to be reviewed the next visit.       Follow up:    I have asked Rachel [name] to follow up 1 month neuro-oph          Levonne Spiller, MD  01/12/2022, 16:04      I have seen and examined the above patient. I discussed the above diagnoses listed in the assessment and the above ophthalmic plan of care with the patient and patient's family. All questions were answered. I reviewed and, when necessary, made changes to the technician/resident note, documented ophthalmology exam, chief complaint, history of present illness, allergies, review of systems, past medical, past surgical, family and social history. I personally reviewed and interpreted all testing and/or imaging performed at this visit and agree with the resident's or fellow's interpretation. Any exceptions/additions are edited/noted in the relevant encounter fields.    I am scribing for, and in the presence of, Dr. Nilsa Nutting for services provided on 01/12/2022.  Ronney Lion, SCRIBE   [place], SCRIBE   01/12/2022, 16:24    Levonne Spiller, MD  01/12/2022, 16:33      Late entry for 01/12/22  When a scribe documentaion is present , I attest that I personally performed the services described in this documentation, as scribed  in my presence, and it is both accurate  and complete.  When a resident or fellow documentation is present, I attest that I saw and examined the patient.  I reviewed the resident's note.  I agree with the findings and plan of care as documented in the resident's note.  Any exceptions/additions are edited/noted.  When performed, I also attest that I reviewed the image(s)  and testing(s)  and agree with the interpretation and report as documented by the resident/fellow.  Exceptions as noted.    Leonia Corona, MD,01/13/2022,07:14

## 2022-01-13 ENCOUNTER — Encounter (INDEPENDENT_AMBULATORY_CARE_PROVIDER_SITE_OTHER): Payer: Self-pay | Admitting: Ophthalmology

## 2022-01-15 ENCOUNTER — Ambulatory Visit (INDEPENDENT_AMBULATORY_CARE_PROVIDER_SITE_OTHER): Payer: Self-pay | Admitting: NURSE PRACTITIONER-ADULT HEALTH

## 2022-01-15 ENCOUNTER — Other Ambulatory Visit: Payer: Self-pay

## 2022-01-15 ENCOUNTER — Inpatient Hospital Stay (HOSPITAL_COMMUNITY)
Admission: RE | Admit: 2022-01-15 | Discharge: 2022-01-15 | Disposition: A | Discharge status: Home / Self Care | Payer: Medicare PPO | Source: Ambulatory Visit

## 2022-01-15 ENCOUNTER — Encounter (HOSPITAL_COMMUNITY): Payer: Self-pay

## 2022-01-15 HISTORY — DX: Shortness of breath: R06.02

## 2022-01-15 HISTORY — DX: Other chronic pain: G89.29

## 2022-01-16 ENCOUNTER — Telehealth (HOSPITAL_BASED_OUTPATIENT_CLINIC_OR_DEPARTMENT_OTHER): Payer: Self-pay | Admitting: Internal Medicine

## 2022-01-16 NOTE — Telephone Encounter (Signed)
Medical Group Practice  [place], [place] [address]     PATIENT NAME: Rachel [name]   MRN: [id number]  DOB: February 13, 1941     1.24.2023  08:13    Received three letters from [facility]. The patient's Methocarbamol, Butalbital-Acetaminophen, and Dexilant are being denied through the patient's insurance. The letter states, "it has been determined that this request for the non-formulary drug does not meet the criteria in policy F-90 General Non-Formulary Request Criteria - Medicare for approval and is, therefore, denied." Placing letters to be scanned for review. Please advise.    Clayton Lefort, BSN RN  Clinical Nurse Coordinator

## 2022-01-17 ENCOUNTER — Encounter (HOSPITAL_COMMUNITY)
Admission: RE | Disposition: A | Discharge status: Home / Self Care | Payer: Self-pay | Source: Ambulatory Visit | Attending: Gastroenterology

## 2022-01-17 ENCOUNTER — Inpatient Hospital Stay
Admission: RE | Admit: 2022-01-17 | Discharge: 2022-01-17 | Disposition: A | Discharge status: Home / Self Care | Payer: Medicare PPO | Source: Ambulatory Visit | Attending: Gastroenterology | Admitting: Gastroenterology

## 2022-01-17 ENCOUNTER — Other Ambulatory Visit: Payer: Self-pay

## 2022-01-17 ENCOUNTER — Ambulatory Visit (HOSPITAL_BASED_OUTPATIENT_CLINIC_OR_DEPARTMENT_OTHER): Payer: Medicare PPO | Admitting: Certified Registered"

## 2022-01-17 ENCOUNTER — Encounter (HOSPITAL_BASED_OUTPATIENT_CLINIC_OR_DEPARTMENT_OTHER): Payer: Self-pay | Admitting: Internal Medicine

## 2022-01-17 ENCOUNTER — Ambulatory Visit (HOSPITAL_COMMUNITY): Payer: Medicare PPO | Admitting: Certified Registered"

## 2022-01-17 ENCOUNTER — Encounter (HOSPITAL_COMMUNITY): Payer: Medicare PPO | Admitting: Gastroenterology

## 2022-01-17 DIAGNOSIS — K449 Diaphragmatic hernia without obstruction or gangrene: Secondary | ICD-10-CM | POA: Insufficient documentation

## 2022-01-17 DIAGNOSIS — R1310 Dysphagia, unspecified: Secondary | ICD-10-CM | POA: Insufficient documentation

## 2022-01-17 SURGERY — GASTROSCOPY
Anesthesia: Monitor Anesthesia Care | Site: Mouth | Wound class: Clean Contaminated Wounds-The respiratory, GI, Genital, or urinary

## 2022-01-17 MED ORDER — LACTATED RINGERS INTRAVENOUS SOLUTION
INTRAVENOUS | Status: DC
Start: 2022-01-17 — End: 2022-01-17
  Administered 2022-01-17: 09:00:00 0 mL via INTRAVENOUS

## 2022-01-17 MED ORDER — LIDOCAINE (PF) 100 MG/5 ML (2 %) INTRAVENOUS SYRINGE
INJECTION | INTRAVENOUS | Status: AC
Start: 2022-01-17 — End: 2022-01-17
  Filled 2022-01-17: qty 5

## 2022-01-17 MED ORDER — SODIUM CHLORIDE 0.9 % (FLUSH) INJECTION SYRINGE
2.0000 mL | INJECTION | INTRAMUSCULAR | Status: DC | PRN
Start: 2022-01-17 — End: 2022-01-17

## 2022-01-17 MED ORDER — PROPOFOL 10 MG/ML INTRAVENOUS EMULSION
INTRAVENOUS | Status: AC
Start: 2022-01-17 — End: 2022-01-17
  Filled 2022-01-17: qty 20

## 2022-01-17 MED ORDER — SODIUM CHLORIDE 0.9% FLUSH BAG - 250 ML
INTRAVENOUS | Status: DC | PRN
Start: 2022-01-17 — End: 2022-01-17

## 2022-01-17 MED ORDER — LIDOCAINE (PF) 100 MG/5 ML (2 %) INTRAVENOUS SYRINGE
INJECTION | Freq: Once | INTRAVENOUS | Status: DC | PRN
Start: 2022-01-17 — End: 2022-01-17
  Administered 2022-01-17 (×2): 30 mg via INTRAVENOUS

## 2022-01-17 MED ORDER — PROPOFOL 10 MG/ML IV BOLUS
INJECTION | Freq: Once | INTRAVENOUS | Status: DC | PRN
Start: 2022-01-17 — End: 2022-01-17
  Administered 2022-01-17: 30 mg via INTRAVENOUS
  Administered 2022-01-17: 20 mg via INTRAVENOUS

## 2022-01-17 MED ORDER — PROPOFOL 10 MG/ML INTRAVENOUS EMULSION
INTRAVENOUS | Status: AC
Start: 2022-01-17 — End: 2022-01-17
  Filled 2022-01-17: qty 50

## 2022-01-17 MED ORDER — DEXTROSE 5% IN WATER (D5W) FLUSH BAG - 250 ML
INTRAVENOUS | Status: DC | PRN
Start: 2022-01-17 — End: 2022-01-17

## 2022-01-17 MED ORDER — PROPOFOL 10 MG/ML INTRAVENOUS EMULSION
INTRAVENOUS | Status: DC | PRN
Start: 2022-01-17 — End: 2022-01-17
  Administered 2022-01-17: 150 ug/kg/min via INTRAVENOUS
  Administered 2022-01-17: 0 ug/kg/min via INTRAVENOUS

## 2022-01-17 MED ORDER — SODIUM CHLORIDE 0.9 % (FLUSH) INJECTION SYRINGE
2.0000 mL | INJECTION | Freq: Three times a day (TID) | INTRAMUSCULAR | Status: DC
Start: 2022-01-17 — End: 2022-01-17

## 2022-01-17 SURGICAL SUPPLY — 2 items
KIT ENDO (INSTRUMENTS ENDOMECHANICAL) ×1
KIT ENDOS CMPLN ENDOKIT ORCAPOD 4 1.1OZ (ENDOSCOPIC SUPPLIES) ×1 IMPLANT

## 2022-01-17 NOTE — Anesthesia Preprocedure Evaluation (Signed)
ANESTHESIA PRE-OP EVALUATION  [name]  Planned Procedure: GASTROSCOPY (Mouth)  Review of Systems    PONV       patient summary reviewed  nursing notes reviewed        Pulmonary    no shortness of breath   Cardiovascular           GI/Hepatic/Renal    GERD        Endo/Other    osteoarthritis,      Neuro/Psych/MS    headaches, depression     Cancer                      Physical Assessment      Airway       Mallampati: III    TM distance: >3 FB    Neck ROM: full  Mouth Opening: good.  No Facial hair  No Beard        Dental           (+) upper dentures, lower dentures           Pulmonary    Breath sounds clear to auscultation  (-) no rhonchi, no decreased breath sounds, no wheezes, no rales and no stridor     Cardiovascular    Rhythm: regular  Rate: Normal       Other findings            Plan  ASA 2     Planned anesthesia type: MAC           SLEEP APNEA  Education provided regarding risk of obstructive sleep apnea        PONV/POV Plan:  I plan to administer pharmcologic prophalaxis antiemetics      Anesthesia issues/risks discussed are: PONV, Blood Loss, Dental Injuries, Nerve Injuries and Sore Throat.  Anesthetic plan and risks discussed with patient  Signed consent obtained        Use of blood products discussed with patient who consented to blood products.     Patient's NPO status is appropriate for Anesthesia.           Plan discussed with CRNA.    (Left axillary dissection in past)

## 2022-01-17 NOTE — H&P (Signed)
[facility]   GI Admission History and Physical      Rachel [name], Rachel [name]   MRN:  [id number]  Date of Birth:  [birth year]    Date of Procedure:  01/17/2022    Chief Complaint: Dysphagia    HPI: Rachel [name], Rachel [name] is a 81 y.o. year old female who presents today for EGD.    This procedure is being done to evaluate Dysphagia.    The patient denies abdominal pain or hematochezia.    Past Medical History:   Diagnosis Date    Chronic pain     Depression     GERD (gastroesophageal reflux disease)     Low back pain     Neck problem     Osteoarthritis     Osteoarthritis     Shortness of breath     Wears glasses            Allergies   Allergen Reactions    Latex     Darvon [Propoxyphene] Mental Status Effect     Hallucinations    Shellfish Derived        Medications Prior to Admission     Prescriptions    Butalbital-Acetaminophen-Caff 50-300-40 mg Oral Capsule    Take 1 Capsule by mouth Once per day as needed Indications: a migraine headache    carboxymethylcellulose sodium (ARTIFICIAL TEARS, CMC, OPHT)    Administer into affected eye(s) Twice daily    cyclobenzaprine (FLEXERIL) 5 mg Oral Tablet    Take 1 Tablet (5 mg total) by mouth Every 8 hours as needed for Muscle spasms for up to 14 days 1 tab daily PRN    DEXILANT 60 mg Oral Cap, Delayed Rel., Multiphasic    Take 1 Capsule (60 mg total) by mouth Once a day    diclofenac sodium (VOLTAREN) 1 % Gel    Apply topically Three times a day    DULoxetine (CYMBALTA DR) 60 mg Oral Capsule, Delayed Release(E.C.)    Take 1 Capsule (60 mg total) by mouth Once a day    fexofenadine HCl (ALLEGRA ORAL)    Take 180 mg by mouth Every morning    mometasone furoate (NASONEX NASL)    Administer into affected nostril(s) Twice daily    naloxone (NARCAN) 4 mg per spray nasal spray    1 Spray by INTRANASAL route Every 2 minutes as needed for actual or suspected opioid overdose. Call 911 if used.    Non-Adherent Bandage (TELFA) 3 X 8 " Bandage    Apply Vaseline to wounds.  Then, apply Telfa pad. Secure with gauze    Oxycodone (ROXICODONE) 10 mg Oral Tablet    Take 1 Tablet (10 mg total) by mouth Every 6 hours as needed for Pain for up to 30 days Indications: pain, osteoarthritis    rOPINIRole (REQUIP) 4 mg Oral Tablet    Take 1 Tablet (4 mg total) by mouth Twice daily for 30 days    sumatriptan succinate (IMITREX) 100 mg Oral Tablet    Take 1 Tablet (100 mg total) by mouth Once, as needed for Migraine for up to 11 days May repeat in 2 hours in needed    topiramate (TOPAMAX) 100 mg Oral Tablet    Take 1 Tablet (100 mg total) by mouth Once a day    topiramate (TOPAMAX) 25 mg Oral Tablet    Take 1 Tablet (25 mg total) by mouth Once a day for 84 doses 1 tab x daily x 7 days then  increase to 2 tab for second week, 3 tabs daily for 3rd week    Patient taking differently:  Take 1 Tablet (25 mg total) by mouth Once a day 1 tab x daily x 7 days then increase to 2 tab for second week, 3 tabs daily for 3rd week  Not taking           Past Surgical History:   Procedure Laterality Date    ANTERIOR FUSION CERVICAL SPINE  12/30/2020    FOOT SURGERY Bilateral     HX BREAST AUGMENTATION Bilateral     x3, 1974, 1994, 2014    HX CATARACT REMOVAL Bilateral 01/12/2021    HX CERVICAL SPINE SURGERY      HX ROTATOR CUFF REPAIR Right     SACROILIAC JOINT INJECTION      TOE AMPUTATION             Physical Exam:  General: NAD, appears well  HEENT: EOMI, Airway patent, Trachea midline, No large goiters or neck lymphadenopathy  Cardio/Pulmonary: No cyanosis, edema. Normal pulses. Good capillary refill  Abdomen: Soft, nondistended, nontender    Assessment:  Dysphagia    Plan:  Proceed with EGD.     Orders Placed This Encounter    REMOVE SALINE LOCK    INSERT & MAINTAIN PERIPHERAL IV ACCESS    PERIPHERAL IV DRESSING CHANGE    NS flush syringe    NS flush syringe    NS 250 mL flush bag    D5W 250 mL flush bag    LR premix infusion         To comply with the [place] [employer] and Accreditation's  mandate for medication reconciliation, the process of identifying the most accurate list of all medications the patient is taking was done by nursing staff through patient's self reported medication usage. While I attest to the medication reconciliation being done, my acknowledgement doesn't in anyway convey or affirm that I am the prescriber of these medications and can't attest to the safety, efficacy or interactions of these medications, and that responsibility rests solely with the prescribing physician    Sharyn Creamer, MD, Marval Regal, AGAF   01/17/2022, 08:02  Associate Professor of Medicine  Gastroenterology and Hepatology  [place]

## 2022-01-17 NOTE — OR Nursing (Signed)
Received from area C via cart

## 2022-01-17 NOTE — Discharge Instructions (Signed)
SURGICAL DISCHARGE INSTRUCTIONS     Dr. Kupec, Justin T, MD  performed your GASTROSCOPY today at the [facility]    [facility]:  Monday through Friday from 6 a.m. - 7 p.m.: [phone]  Between 7 p.m. - 6 a.m., weekends and holidays:  Call [facility] at [phone] or [phone].    PLEASE SEE WRITTEN HANDOUTS AS DISCUSSED BY YOUR NURSE:      SIGNS AND SYMPTOMS OF A WOUND / INCISION INFECTION   Be sure to watch for the following:  Increase in redness or red streaks near or around the wound or incision.  Increase in pain that is intense or severe and cannot be relieved by the pain medication that your doctor has given you.  Increase in swelling that cannot be relieved by elevation of a body part, or by applying ice, if permitted.  Increase in drainage, or if yellow / green in color and smells bad. This could be on a dressing or a cast.  Increase in fever for longer than 24 hours, or an increase that is higher than 101 degrees Fahrenheit (normal body temperature is 98 degrees Fahrenheit). The incision may feel warm to the touch.    **CALL YOUR DOCTOR IF ONE OR MORE OF THESE SIGNS / SYMPTOMS SHOULD OCCUR.    ANESTHESIA INFORMATION   ANESTHESIA -- ADULT PATIENTS:  You have received intravenous sedation / general anesthesia, and you may feel drowsy and light-headed for several hours. You may even experience some forgetfulness of the procedure. DO NOT DRIVE A MOTOR VEHICLE or perform any activity requiring complete alertness or coordination until you feel fully awake in about 24-48 hours. Do not drink alcoholic beverages for at least 24 hours. Do not stay alone, you must have a responsible adult available to be with you. You may also experience a dry mouth or nausea for 24 hours. This is a normal side effect and will disappear as the effects of the medication wear off.    REMEMBER   If you experience any difficulty breathing, chest pain, bleeding that you feel is excessive, persistent  nausea or vomiting or for any other concerns:  Call your physician Dr. Kupec at [phone] or [phone]. You may also ask to have the GI doctor on call paged. They are available to you 24 hours a day.    SPECIAL INSTRUCTIONS / COMMENTS   Instructions for Upper Endoscopy:  Patient Information:  No driving until tomorrow, You may experience a sore throat and gas or bloating for 24 hours, Use an antacid for mild discomfort, and Call in one week for biopsy results- Referring MD   REMEMBER TO:  Call your physician for any of the following symptoms:  1.) Persistent vomiting or vomiting bright red blood  2.) Fever (temperature greater than 100F)  3.) Any difficulty breathing.      FOLLOW-UP APPOINTMENTS   Please call patient services at [phone] or [phone] to schedule a date / time of return. They are open Monday - Friday from 7:30 am - 5:00 pm.

## 2022-01-17 NOTE — OR Nursing (Signed)
Tolerated procedure well.

## 2022-01-17 NOTE — Anesthesia Postprocedure Evaluation (Signed)
Anesthesia Post Op Evaluation    Patient: Rachel [name]  Procedure(s):  GASTROSCOPY    Last Vitals:Temperature: 36.7 C (98.1 F) (01/17/22 0910)  Heart Rate: 84 (01/17/22 0910)  BP (Non-Invasive): 112/89 (01/17/22 0910)  Respiratory Rate: 18 (01/17/22 0910)  SpO2: 99 % (01/17/22 0910)    No notable events documented.    Patient is sufficiently recovered from the effects of anesthesia to participate in the evaluation and has returned to their pre-procedure level.  Patient location during evaluation: PACU       Patient participation: complete - patient participated  Level of consciousness: awake and alert and responsive to verbal stimuli    Pain score: 0  Pain management: adequate  Airway patency: patent    Anesthetic complications: no  Cardiovascular status: acceptable  Respiratory status: acceptable  Hydration status: acceptable  Patient post-procedure temperature: Pt Normothermic   PONV Status: Absent

## 2022-01-17 NOTE — Anesthesia Transfer of Care (Signed)
ANESTHESIA TRANSFER OF CARE   Rachel [name] is a 81 y.o. ,female, Weight: 47.2 kg (104 lb 0.9 oz)   had Procedure(s):  GASTROSCOPY  performed  01/17/22   Primary Service: Sharyn Creamer, MD    Past Medical History:   Diagnosis Date    Chronic pain     Depression     GERD (gastroesophageal reflux disease)     Low back pain     Neck problem     Osteoarthritis     Osteoarthritis     Shortness of breath     Wears glasses       Allergy History as of 01/17/22     LATEX       Noted Status Severity Type Reaction    09/13/21 1059 Melvyn Novas, RN 09/13/21 Active             SHELLFISH DERIVED       Noted Status Severity Type Reaction    09/13/21 1059 Melvyn Novas, RN 09/13/21 Active             PROPOXYPHENE       Noted Status Severity Type Reaction    10/04/21 1001 Hedwig Morton, [place] 10/04/21 Active   Mental Status Effect    Comments: Hallucinations               I completed my transfer of care / handoff to the receiving personnel during which we discussed:  All key/critical aspects of case discussed and Gave opportunity for questions and acknowledgement of understanding    Post Location: Phase II                                          Additional Info:Report given to RN                        Last OR Temp: Temperature: 36.2 C (97.2 F)  ABG:  POTASSIUM   Date Value Ref Range Status   12/27/2021 4.6 3.5 - 5.1 mmol/L Final     KETONES   Date Value Ref Range Status   12/27/2021 Negative Negative mg/dL Final     CALCIUM   Date Value Ref Range Status   12/27/2021 8.9 8.8 - 10.2 mg/dL Final     Airway:* No LDAs found *  Blood pressure (!) 144/83, pulse 89, temperature 36.2 C (97.2 F), resp. rate 16, height 1.63 m (5' 4.17"), weight 47.2 kg (104 lb 0.9 oz), SpO2 98 %.

## 2022-01-19 ENCOUNTER — Ambulatory Visit (INDEPENDENT_AMBULATORY_CARE_PROVIDER_SITE_OTHER): Payer: Medicare PPO | Admitting: Clinical Neuropsychologist

## 2022-01-19 NOTE — Telephone Encounter (Signed)
Faxed prior auth forms will scan to media

## 2022-01-22 ENCOUNTER — Ambulatory Visit (INDEPENDENT_AMBULATORY_CARE_PROVIDER_SITE_OTHER): Payer: Self-pay | Admitting: Student in an Organized Health Care Education/Training Program

## 2022-01-22 NOTE — Telephone Encounter (Signed)
Regarding: munir  ----- Message from Brain Hilts sent at 01/22/2022  9:16 AM EST -----  The PA for the cyclobenzaprine (FLEXERIL) 5 mg Oral Tablet has been denied. Case # [id number]

## 2022-01-22 NOTE — Telephone Encounter (Signed)
I sent you the paper where they denied it is in the media tab . Please advise

## 2022-01-23 ENCOUNTER — Ambulatory Visit (HOSPITAL_BASED_OUTPATIENT_CLINIC_OR_DEPARTMENT_OTHER): Payer: Self-pay | Admitting: Internal Medicine

## 2022-01-23 ENCOUNTER — Telehealth (INDEPENDENT_AMBULATORY_CARE_PROVIDER_SITE_OTHER): Payer: Self-pay | Admitting: Internal Medicine

## 2022-01-23 NOTE — Telephone Encounter (Signed)
Regarding: concerning Rx  ----- Message from [place] sent at 01/23/2022  1:15 PM EST -----  [name], MD          [facility] called in and stated that they got four denials for the pt Rx  Oxycodone (ROXICODONE) 10 mg Oral Tablet,METHOCARBAMOL ORAL,METHOCARBAMOL ORAL and Butalbital-Acetaminophen-Caff 50-300-40 mg Oral Capsule. [facility] stated that this is time sensitive and needs a response today Tuesday 01/23/22. [employer] also stated that they need a verbal call , because they have specific questions on the pt Rx.  Fax number [phone]. Please call and advise.

## 2022-01-23 NOTE — Telephone Encounter (Signed)
Received questions regarding authorization of medications and discussed with Dr. Megan Salon. Called Toya Back to inform her of answers. She stated an understanding. Will scan document into chart.Alonza Bogus, RN  01/23/2022, 14:50

## 2022-01-23 NOTE — Telephone Encounter (Signed)
Rcv'd a VM from this morning from [place] at [facility] stating that the pt has 4 denial letters re: medications that are due today.  Carolee Rota states that her medical director needed some information and is asking for a return call.    I called and LVM on Toya's VM stating that the office that she called is not associated with the pt in question. Pt is not a pt of our office.    Gregary Cromer, CMA  01/23/2022, 14:48

## 2022-01-23 NOTE — Telephone Encounter (Signed)
Spoke with Carolee Rota regarding this information, she stated that there are questions that need answered for each specific medication. Informed her that i would be able to answer them if she would liekt o fax me the forms. She will fax forms and then i will call her back at [phone]. Alonza Bogus, RN  01/23/2022, 13:36

## 2022-01-27 ENCOUNTER — Ambulatory Visit (INDEPENDENT_AMBULATORY_CARE_PROVIDER_SITE_OTHER): Payer: Self-pay

## 2022-02-05 ENCOUNTER — Other Ambulatory Visit (INDEPENDENT_AMBULATORY_CARE_PROVIDER_SITE_OTHER): Payer: Self-pay | Admitting: Ophthalmology

## 2022-02-05 DIAGNOSIS — H538 Other visual disturbances: Secondary | ICD-10-CM

## 2022-02-06 ENCOUNTER — Other Ambulatory Visit (INDEPENDENT_AMBULATORY_CARE_PROVIDER_SITE_OTHER): Payer: Medicare PPO | Admitting: Rheumatology

## 2022-02-06 ENCOUNTER — Encounter (INDEPENDENT_AMBULATORY_CARE_PROVIDER_SITE_OTHER): Payer: Self-pay | Admitting: Ophthalmology

## 2022-02-06 ENCOUNTER — Inpatient Hospital Stay (HOSPITAL_BASED_OUTPATIENT_CLINIC_OR_DEPARTMENT_OTHER)
Admission: RE | Admit: 2022-02-06 | Discharge: 2022-02-06 | Disposition: A | Discharge status: Home / Self Care | Payer: Medicare PPO | Source: Ambulatory Visit

## 2022-02-06 ENCOUNTER — Other Ambulatory Visit: Payer: Self-pay

## 2022-02-06 ENCOUNTER — Ambulatory Visit: Payer: Medicare PPO | Attending: Ophthalmology | Admitting: Ophthalmology

## 2022-02-06 ENCOUNTER — Ambulatory Visit (HOSPITAL_BASED_OUTPATIENT_CLINIC_OR_DEPARTMENT_OTHER)
Admission: RE | Admit: 2022-02-06 | Discharge: 2022-02-06 | Disposition: A | Discharge status: Home / Self Care | Payer: Medicare PPO | Source: Ambulatory Visit

## 2022-02-06 DIAGNOSIS — H538 Other visual disturbances: Secondary | ICD-10-CM

## 2022-02-06 DIAGNOSIS — H5347 Heteronymous bilateral field defects: Secondary | ICD-10-CM

## 2022-02-06 DIAGNOSIS — H53483 Generalized contraction of visual field, bilateral: Secondary | ICD-10-CM | POA: Insufficient documentation

## 2022-02-06 DIAGNOSIS — R519 Headache, unspecified: Secondary | ICD-10-CM | POA: Insufficient documentation

## 2022-02-06 DIAGNOSIS — H5000 Unspecified esotropia: Secondary | ICD-10-CM

## 2022-02-06 LAB — VITAMIN B12: VITAMIN B 12: 324 pg/mL (ref 200–900)

## 2022-02-06 LAB — SYPHILIS SCREENING ALGORITHM WITH REFLEX, SERUM: SYPHILIS TP ANTIBODIES: NONREACTIVE

## 2022-02-06 LAB — FOLATE: FOLATE: 7.8 ng/mL (ref 7.0–31.0)

## 2022-02-06 NOTE — Progress Notes (Addendum)
[name]  [place] [address]  Operated by [place]         Patient Name: Rachel [name]  MRN#: [id number]  Birthdate: October 26, 1941    Date of Service: 02/06/2022    Chief Complaint    Blurred Vision         BERNETTE SEEMAN is a 81 y.o. female who presents today for evaluation/consultation of:  HPI     Blurred Vision    In both eyes.           Comments    Presenting Compliant:  bilateral blurred va     Referred by: Dr. Shana Chute, MD    HPI: Pt is here today for referral from Dr. Nilsa Nutting for bilateral blurry vision. Pt states that she has migraines everyday. States that she wakes up with migraines everyday.   States that she has sinusitis frequently.   Pt states that she has issues with depth perception   Rx is about 33yr old. Hx of cat sx.   Pt states that she noticed double vision for the first last night believes it in both. Also noticed halos around lighting a few days ago.     81 yo F presented to clinic with blurred vision    Negative History: leg weakness, arm weakness, temple tenderness, jaw tenderness, weight loss, fevers, facial palsy, scalp tenderness,  transient visual obscurations.     See notes for detailed history              Last edited by Lucretia Field, MD on 02/06/2022  2:16 PM.        ROS    Positive for: Eyes  Negative for: Constitutional, Gastrointestinal, Neurological, Skin, Genitourinary, Musculoskeletal, HENT, Endocrine, Cardiovascular, Respiratory, Psychiatric, Allergic/Imm, Heme/Lymph  Last edited by Cherrie Gauze, COA on 02/06/2022  1:42 PM.         All other systems Negative    Cherrie Gauze, [place]  02/06/2022, 13:58  ---------------------------------    MD Addition to HPI: 81 Yo F for neuro-ophthalmic evaluation due to visual disturbance    Main concerns:    Vision not clear in both eyes, can't read signs while driving  paracentral visual field defects OU     Timeline Hx:  ~2021: Vision not clear OU, slightly worse over time.  Intermittent, 2-3/er week. Lasts few minutes. She likes to watch TV and she could not see the subtitles when she closes one eye the vision becomes sharp.      She sees halos around lights at night - lights from incoming vehicles very bright at night- glare bother's her    Denies any veils and curtains over her eye.     Denies temple pains, scalp tenderness, jaw claudication, weight loss, fevers, sudden vision loss, diplopia.     Headaches every day since last months, always had sinus headaches.     Headaches top of head, 10/10 intensity, sharp pain to pounding, doesn't wake her up, last 4 hrs. Takes oxycodone and tylenol. Diagnosed as migraines by a neurologist in [place]. Sensitive to lights, sounds, no visual aura. Neck pain radiates up to the top of her head    Hx of neck surgery 2022, gets neck pains      Past Surgical History:   Procedure Laterality Date   . ANTERIOR FUSION CERVICAL SPINE  12/30/2020   . FOOT SURGERY Bilateral    . HX BREAST AUGMENTATION Bilateral     x3,  [place], 2014   . HX CATARACT REMOVAL Bilateral 01/12/2021   . HX CERVICAL SPINE SURGERY     . HX ROTATOR CUFF REPAIR Right    . SACROILIAC JOINT INJECTION     . TOE AMPUTATION             Past Medical History:   Diagnosis Date   . Chronic pain    . Depression    . GERD (gastroesophageal reflux disease)    . Low back pain    . Neck problem    . Osteoarthritis    . Osteoarthritis    . Shortness of breath    . Wears glasses            Past Medical History Pertinent Negatives:   Diagnosis Date Noted   . Angina pectoris (CMS HCC) 10/04/2021   . Asthma 10/04/2021   . Awareness under anesthesia 10/04/2021   . Cancer (CMS [place]) 10/04/2021   . Chronic bronchitis (CMS [place]) 10/04/2021   . Chronic obstructive airway disease (CMS HCC) 01/15/2022   . Clotting disorder (CMS [place]) 10/04/2021   . Congenital anomaly of heart 10/04/2021   . Congestive heart failure (CMS HCC) 10/04/2021   . Convulsions (CMS [place]) 10/04/2021   . Coronary artery disease 10/04/2021    . CVA (cerebrovascular accident) (CMS [place]) 10/04/2021   . Deep vein thrombosis (DVT) (CMS HCC) 10/04/2021   . Dysrhythmias 10/04/2021   . Essential hypertension 10/04/2021   . H/O hearing loss 10/04/2021   . H/O urinary tract infection 10/04/2021   . Hard to intubate 10/04/2021   . Heart murmur 10/04/2021   . History of anesthesia complications 36/64/4034   . History of kidney disease 10/04/2021   . Hyperlipidemia 10/04/2021   . Malignant hyperthermia 10/04/2021   . MI (myocardial infarction) (CMS [place]) 10/04/2021   . PONV (postoperative nausea and vomiting) 10/04/2021   . 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 Pulmonary embolism (CMS HCC) 10/04/2021   . Pulmonary emphysema (CMS HCC) 01/15/2022   . Sleep apnea 10/04/2021   . Thyroid disorder 10/04/2021   . Type 2 diabetes mellitus (CMS [place]) 10/04/2021   . Type I diabetes mellitus (CMS [place]) 10/04/2021   . Upper respiratory infection 10/04/2021   . Viral hepatitis B 10/04/2021   . Viral hepatitis C 10/04/2021       Patient Active Problem List   Diagnosis   . Healthcare maintenance   . Visual impairment   . Dysphagia   . Hypoglycemia   . Malnutrition (CMS [place])   . Osteoarthritis   . Migraine   . Left-sided chest wall pain   . Pre-syncope   . Spider veins   . Thick nasal mucus   . uds 09/29/21 11/23/21, 12/14/2021   . Memory deficit   . Cervicogenic headache   . Medication overuse headache   . 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 Smoking status: Never   . Smokeless tobacco: Never   Substance Use Topics   . Alcohol use: Not Currently       Base Eye Exam     Visual Acuity (Snellen - Linear)  Right Left    Dist cc 20/40 20/25 +1    Dist ph cc 20/20 -1 NI    Correction: Glasses          Tonometry (Tonopen, 2:33 PM)       Right Left    Pressure 16 16           Pupils       Shape React APD    Right Round Brisk None    Left Round Brisk None          Visual Fields (Counting fingers)       Right Left     Full Full          Extraocular Movement       Right Left     Full Full          Neuro/Psych     Oriented x3: Yes    Mood/Affect: Normal          Dilation     Both eyes: 1.0% Mydriacyl, 2.5% Phenylephrine, 0.5% Proparacaine @ 2:33 PM            Additional Tests     Color       Right Left    [place] 14/14 14/14            Strabismus Exam     Reading #1   (Edited by: Lucretia Field, MD)    Method: CT/ACT    Correction: cc    Distance Near Near +3DS N Bifocals     Ortho                0 0 0  Ortho  0 0 0                      ET 6 0  0  ET 6 0  0  ET 6          RH 1     RH 1      0 0 0  ET 4 0 0 0          RH 1             Reading #2   (Edited by: Salem Caster, CO)    Method: PACT    Correction: cc    Distance Near Near +3DS N Bifocals    E(T) 4 E(T)' flick      RH(T) 3 RH(T)' flick        0 0 0  ET flick 0 0 0                      ET 4 0  0  E(T) 4 0  0  ET 4     RHT flick     RH(T) 3     RHT 3      0 0 0  ET 4 0 0 0          RHT 3       R Tilt L Tilt   ET 4 ET 4   RHT 3 RHT 3                    Comments    Sensorimotor Exam Report Interpreted by:   Salem Caster, CO  02/06/2022,  15:44    Indication: Oculomotor concern or visual disturbance  Reliability: Good  Interpretation: small esotropia and right hypertropia     PAT  cc: likes 2 BO OD     Placed 2 BO fresnel on right lens       I reviewed the orthoptist's note and agree with the findings and plan of care as documented.  Any exceptions/additions are edited/noted.    Lucretia Field, MD               Slit Lamp and Fundus Exam     External Exam       Right Left    External Normal Normal          Slit Lamp Exam       Right Left    Lids/Lashes Normal Normal    Conjunctiva/Sclera White and quiet White and quiet    Cornea tr inf pee inf pee    Anterior Chamber Deep and quiet Deep and quiet    Iris Round and reactive Round and  reactive    Lens multifocal toric, open PC multifocal toric, open PC    Anterior Vitreous pvd Normal          Fundus Exam       Right Left    Disc pink healthy pink healthy    C/D Ratio 0.3 0.3    Macula rare drusen rare drusen    Vessels Normal Normal    Periphery extramacular drusen extramacular drusen            Refraction     Wearing Rx       Sphere Cylinder Axis Add    Right [place] +0.50 120 +2.75    Left -0.25 +1.25 175 +2.75    Age: 29yr    Type: PAL                        ENCOUNTER DIAGNOSES     ICD-10-CM   1. 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 Generalized contraction of visual field, bilateral  H53.483   3. Headache  R51.9     Orders Placed This Encounter   Procedures   . OPH RNFL BI       Neuroimaging:  MRI brain/ orbits w/ wo: none    ANCILLARY TESTING 02/06/2022:  HVF: OU nonspecific inferior few misses, reliable previous fields not reliable  OCT RNFL: normal ou   OCT GCC: normal ou   OCT Macula: normal ou     Impression:   # Visual field defects: reliable VF done today do not note the visual field defects noted earlier as those were unreliable. VF today note non-specific few inferior misses OU. 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   - Per patient, vision not clear in both eyes, can't read signs while driving onset ~1224, getting slightly worse over time. Intermittent, 2-3/er week. Lasts few minutes. She likes to watch TV and she could not see the subtitles, when she closed one eye the vision becomes sharp.    - She sees halos around lights at night - lights from incoming vehicles very bright at night- glare bothers her    Will dispense prism Rx OD 2 BO on right lens    - Denies any veils and curtains over her eye.     - Denies temple pains, scalp tenderness, jaw claudication, weight loss, fevers, sudden  vision loss, diplopia.     #  Cervicogenic headaches: patient had neck surgery in 2022  - Headaches every day since last months, always had sinus headaches.   Per patient, neck pain radiates up to the top of her head, 10/10 intensity, sharp pain to pounding, doesn't wake her up, last 4 hrs. Takes oxycodone and tylenol.  Sensitive to lights, sounds, no visual aura. Diagnosed as migraines by a neurologist in [place].  - Hx of neck surgery 2022, gets neck pains  - Will order headache neurology consult.     Plan:  - MRI orbits as scheduled  - Fresnel prism dispensed  - Ordered labs: optic neuropathy work up   - Headache neurology consult placed    Follow up:    I have asked Roderick A Liggett to follow up in 3 months. Reassess.          I have seen and examined the above patient. I discussed the above diagnoses listed in the assessment and the above ophthalmic plan of care with the patient and patient's family. All questions were answered. I reviewed and, when necessary, made changes to the technician/resident note, documented ophthalmology exam, chief complaint, history of present illness, allergies, review of systems, past medical, past surgical, family and social history. I personally reviewed and interpreted all testing and/or imaging performed at this visit and agree with the resident's or fellow's interpretation. Any exceptions/additions are edited/noted in the relevant encounter fields.      Lucretia Field, MD 02/06/2022, 19:46      ------------------------  Addendum: 02/13/2022:  Left voicemail for patient stating her ACE levels are high and that I am waiting on the MRI.     Lucretia Field, MD, 1.09 pm

## 2022-02-10 LAB — VITAMIN B6 (PYRIDOXAL 5-PHOSPHATE), PLASMA: VITAMIN B6, PLASMA: 6.9 ng/mL (ref 2.1–21.7)

## 2022-02-12 ENCOUNTER — Ambulatory Visit (HOSPITAL_BASED_OUTPATIENT_CLINIC_OR_DEPARTMENT_OTHER): Payer: Self-pay | Admitting: Internal Medicine

## 2022-02-12 LAB — VITAMIN B1 (THIAMIN), WHOLE BLOOD: VITAMIN B1 (THIAMINE), BLOOD, LC/MS/MS: 105 nmol/L (ref 78–185)

## 2022-02-12 LAB — ANGIOTENSIN CONVERTING ENZYME (ACE), SERUM: ANGIOTENSIN CONV ENZYME: 79 U/L — ABNORMAL HIGH (ref 9–67)

## 2022-02-12 NOTE — Telephone Encounter (Signed)
Called the patient, Rachel [name], back regarding her triage message. Informed patient that she can drop off the handicap form today at the front desk for Dr. Megan Salon to sign. Patient verbalized an understanding and had no further questions or concerns.   [name], RN  02/12/2022, 09:11

## 2022-02-12 NOTE — Telephone Encounter (Signed)
Patient presented to clinic to get disabled person parking permit application signed by physician. Physician signed and scanned to chart. Franciso Bend, LPN  09/13/1939, 74:08

## 2022-02-12 NOTE — Telephone Encounter (Signed)
Regarding: needing handicap form signed  ----- Message from Noelle Penner sent at 02/12/2022  9:05 AM EST -----  [name], MD    Handicap form needs signed.  States it is already filled out and just needs it signed. Can she bring that to the front desk today.  Please call pt to advise.

## 2022-02-13 ENCOUNTER — Other Ambulatory Visit (HOSPITAL_BASED_OUTPATIENT_CLINIC_OR_DEPARTMENT_OTHER): Payer: Self-pay | Admitting: Internal Medicine

## 2022-02-13 ENCOUNTER — Ambulatory Visit (INDEPENDENT_AMBULATORY_CARE_PROVIDER_SITE_OTHER): Payer: Self-pay | Admitting: Student in an Organized Health Care Education/Training Program

## 2022-02-13 DIAGNOSIS — M1990 Unspecified osteoarthritis, unspecified site: Secondary | ICD-10-CM

## 2022-02-13 LAB — LYSOZYME (MURAMIDASE), PLASMA: LYSOZYME (MURAMIDASE): 9.9 ug/mL (ref 5.0–11.0)

## 2022-02-13 MED ORDER — CYCLOBENZAPRINE 5 MG TABLET
5.0000 mg | ORAL_TABLET | Freq: Three times a day (TID) | ORAL | 0 refills | Status: AC | PRN
Start: 2022-02-13 — End: 2022-02-27

## 2022-02-13 MED ORDER — OXYCODONE 10 MG TABLET
10.0000 mg | ORAL_TABLET | Freq: Four times a day (QID) | ORAL | 0 refills | Status: DC | PRN
Start: 2022-02-13 — End: 2022-03-21

## 2022-02-13 NOTE — Telephone Encounter (Signed)
Are you refilling

## 2022-02-13 NOTE — Telephone Encounter (Signed)
Regarding: rx refill  ----- Message from Joylene Igo sent at 02/13/2022 12:27 PM EST -----  [name], MD      Patient called in needing a refill of the following.  Please advise. Thank you!      Oxycodone (ROXICODONE) 10 mg Oral Tablet      Last appointment:    1.16.23  Next appointment:   3.29.23    Preferred [place] [place]    [place] [address]    Phone: [phone] Fax: [phone]    Hours: Not open 24 hours

## 2022-02-13 NOTE — Telephone Encounter (Signed)
Regarding: Mun[place]  ----- Message from Ames Dura sent at 02/13/2022 12:31 PM EST -----  Patient is needing a refill on cyclobenzaprine (FLEXERIL) 5 mg Oral Tablet       Preferred Pharmacy     [facility] [place] [place]    [place] [address]    Phone: [phone] Fax: [phone]    Hours: Not open 24 hours

## 2022-02-13 NOTE — Telephone Encounter (Signed)
Refill for Flexeril sent.     Arville Go, MD

## 2022-02-14 ENCOUNTER — Inpatient Hospital Stay
Admission: RE | Admit: 2022-02-14 | Discharge: 2022-02-14 | Disposition: A | Discharge status: Home / Self Care | Payer: Medicare PPO | Source: Ambulatory Visit | Attending: Internal Medicine | Admitting: Internal Medicine

## 2022-02-14 ENCOUNTER — Other Ambulatory Visit (HOSPITAL_BASED_OUTPATIENT_CLINIC_OR_DEPARTMENT_OTHER): Payer: Self-pay | Admitting: Family

## 2022-02-14 ENCOUNTER — Other Ambulatory Visit: Payer: Self-pay

## 2022-02-14 ENCOUNTER — Other Ambulatory Visit (INDEPENDENT_AMBULATORY_CARE_PROVIDER_SITE_OTHER): Payer: Self-pay | Admitting: Student in an Organized Health Care Education/Training Program

## 2022-02-14 DIAGNOSIS — M1990 Unspecified osteoarthritis, unspecified site: Secondary | ICD-10-CM

## 2022-02-14 DIAGNOSIS — R609 Edema, unspecified: Secondary | ICD-10-CM

## 2022-02-14 DIAGNOSIS — I350 Nonrheumatic aortic (valve) stenosis: Secondary | ICD-10-CM | POA: Insufficient documentation

## 2022-02-14 LAB — TRANSTHORACIC ECHOCARDIOGRAM - ADULT
Aortic Valve Area by Continuity of Peak Velocity: 1.64 cm2
Aortic Valve Area by Continuity of VTI: 1.9 cm2
FS: 52 percent — AB (ref 28–44)
IVS: 0.77 cm (ref 0.6–1.1)
LV Diastolic Volume Index: 46.3 milliliter
LV Diastolic Volume Index: 49.5 milliliter
LV Diastolic Volume Index: 55.2 milliliter
LV Diastolic Volume Index: 59 milliliter
LV Systolic Volume Index: 12.5 milliliter
LV Systolic Volume Index: 13.3 milliliter
LV Systolic Volume Index: 13.8 milliliter
LV Systolic Volume Index: 7.4 milliliter
LVIDD: 3.37 cm — AB (ref 3.5–6.0)
LVIDS: 1.62 cm — AB (ref 2.1–4.0)
LVOT diameter: 1.77 cm
LVOT stroke volume: 43.92 milliliter
LVPWD: 0.86 cm
Left Atrium Antero-posterior Dimension: 3.12 cm
MV Deceleration Time: 208.52 ms
MV Peak A Vel: 85.59 cm/s
MV Peak E Vel: 91.95 cm/s
MV VTI: 25.97 cm
MV mean gradient post stress: 75.96 cm/s
MV mean gradient post stress: 76.25 cm/s
MV mean gradient post stress: 76.54 cm/s
MV mean gradient: 2.58 mmHg
MV peak gradient: 4.69 mmHg
MV stenosis pressure 1/2 time: 42.96 ms
MV valve area by continuity eq: 1.67 cm2
MV valve area by continuity eq: 1.69 cm2
MV valve area by continuity eq: 1.71 cm2
MV valve area p 1/2 method: 5.12 cm2
Mitral Valve Max Velocity: 108.26 cm/s
Mitral Valve Pressure Half-time: 42.96 ms
TR VELOCITY: 203.41 cm/s
TR VELOCITY: 238.87 cm/s
TR VELOCITY: 238.87 cm/s

## 2022-02-15 ENCOUNTER — Inpatient Hospital Stay (HOSPITAL_BASED_OUTPATIENT_CLINIC_OR_DEPARTMENT_OTHER)
Admission: RE | Admit: 2022-02-15 | Discharge: 2022-02-15 | Disposition: A | Discharge status: Home / Self Care | Payer: Medicare PPO | Source: Ambulatory Visit

## 2022-02-15 ENCOUNTER — Ambulatory Visit: Payer: Medicare PPO | Attending: Podiatrist | Admitting: Podiatrist

## 2022-02-15 ENCOUNTER — Encounter (INDEPENDENT_AMBULATORY_CARE_PROVIDER_SITE_OTHER): Payer: Self-pay | Admitting: Podiatrist

## 2022-02-15 ENCOUNTER — Ambulatory Visit (HOSPITAL_BASED_OUTPATIENT_CLINIC_OR_DEPARTMENT_OTHER): Payer: Self-pay | Admitting: Internal Medicine

## 2022-02-15 VITALS — BP 126/66 | HR 97 | Temp 96.4°F | Ht 64.0 in | Wt 104.0 lb

## 2022-02-15 DIAGNOSIS — M79672 Pain in left foot: Secondary | ICD-10-CM | POA: Insufficient documentation

## 2022-02-15 DIAGNOSIS — M79675 Pain in left toe(s): Secondary | ICD-10-CM | POA: Insufficient documentation

## 2022-02-15 DIAGNOSIS — M2012 Hallux valgus (acquired), left foot: Secondary | ICD-10-CM | POA: Insufficient documentation

## 2022-02-15 DIAGNOSIS — M2011 Hallux valgus (acquired), right foot: Secondary | ICD-10-CM | POA: Insufficient documentation

## 2022-02-15 DIAGNOSIS — L909 Atrophic disorder of skin, unspecified: Secondary | ICD-10-CM | POA: Insufficient documentation

## 2022-02-15 DIAGNOSIS — M79674 Pain in right toe(s): Secondary | ICD-10-CM | POA: Insufficient documentation

## 2022-02-15 DIAGNOSIS — M79671 Pain in right foot: Secondary | ICD-10-CM | POA: Insufficient documentation

## 2022-02-15 DIAGNOSIS — M21621 Bunionette of right foot: Secondary | ICD-10-CM | POA: Insufficient documentation

## 2022-02-15 DIAGNOSIS — Z9889 Other specified postprocedural states: Secondary | ICD-10-CM | POA: Insufficient documentation

## 2022-02-15 NOTE — Telephone Encounter (Signed)
Regarding: concerning Rx  ----- Message from [place] sent at 02/14/2022  7:01 PM EST -----  [name], MD    [facility] - [address] [place] calling requesting a call back at [phone] to inform Dr. Megan Salon of incident that happened at pharmacy this evening. Pts husband refusing to pick up pts Oxycodone (ROXICODONE) script. Pharmacist wanted to make Dr. Megan Salon aware of situation. Pharmacist will be in after 2pm.

## 2022-02-15 NOTE — H&P (Signed)
[facility]  [place], [place] [address]    PATIENT NAME:  Rachel [name]  DATE OF BIRTH:  July 29, 1941  MRN:  [id number]  DATE OF SERVICE:  02/15/2022    HISTORY OF PRESENT ILLNESS:   81 y.o. White female presents for evaluation of chief complaint of painful toes. Pt points to her bilateral IPJ as area of most pain. She reports that she has L 3rd, 4th digit toe drag, noting that they get stuck and cause her pain. Pt states she enjoys playing golf and tennis but her feet and toe pain have effected her ability to play. Pt states she had all of her foot surgeries completed in [place], noting they were done by 3 different doctors. 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 Pt states she just recently moved to [place] from [place]. Pt is married and denies tobacco use.     PAST MEDICAL HISTORY:   Past Medical History:   Diagnosis Date   . Chronic pain    . Depression    . GERD (gastroesophageal reflux disease)    . Low back pain    . Neck problem    . Osteoarthritis    . Osteoarthritis    . Shortness of breath    . Wears glasses      PAST SURGICAL HISTORY:   Past Surgical History:   Procedure Laterality Date   . Anterior fusion cervical spine  12/30/2020   . Foot surgery Bilateral    . Hx breast augmentation Bilateral    . Hx cataract removal Bilateral 01/12/2021   . Hx cervical spine surgery     . Hx rotator cuff repair Right    . Sacroiliac joint injection     . Toe amputation       MEDICATIONS:   Current Outpatient Medications   Medication Sig   . Butalbital-Acetaminophen-Caff 50-300-40 mg Oral Capsule Take 1 Capsule by mouth Once per day as needed Indications: a migraine headache   . carboxymethylcellulose sodium (ARTIFICIAL  TEARS, CMC, OPHT) Administer into affected eye(s) Twice daily   . cyclobenzaprine (FLEXERIL) 5 mg Oral Tablet Take 1 Tablet (5 mg total) by mouth Every 8 hours as needed for Muscle spasms for up to 14 days 1 tab daily PRN   . DEXILANT 60 mg Oral Cap, Delayed Rel., Multiphasic Take 1 Capsule (60 mg total) by mouth Once a day   . diclofenac sodium (VOLTAREN) 1 % Gel Apply topically Three times a day   . DULoxetine (CYMBALTA DR) 60 mg Oral Capsule, Delayed Release(E.C.) Take 1 Capsule (60 mg total) by mouth Once a day   . fexofenadine HCl (ALLEGRA ORAL) Take 180 mg by mouth Every morning   . mometasone furoate (NASONEX NASL) Administer into affected nostril(s) Twice daily   . naloxone (NARCAN) 4 mg per spray nasal spray 1 Spray by INTRANASAL route Every 2 minutes as needed for actual or suspected opioid overdose. Call 911 if used.   . Non-Adherent Bandage (TELFA) 3 X 8 " Bandage Apply Vaseline to wounds. Then, apply Telfa pad. Secure with gauze   . Oxycodone (ROXICODONE) 10 mg Oral Tablet Take 1 Tablet (10 mg total) by mouth Every 6 hours as needed for Pain  for up to 30 days Indications: pain, osteoarthritis   . topiramate (TOPAMAX) 100 mg Oral Tablet Take 1 Tablet (100 mg total) by mouth Once a day   . topiramate (TOPAMAX) 25 mg Oral Tablet Take 1 Tablet (25 mg total) by mouth Once a day for 84 doses 1 tab x daily x 7 days then increase to 2 tab for second week, 3 tabs daily for 3rd week (Patient not taking: Reported on 02/06/2022)     ALLERGIES:   Allergies   Allergen Reactions   . Latex    . Darvon [Propoxyphene] Mental Status Effect     Hallucinations   . Shellfish Derived      FAMILY MEDICAL HISTORY:   Family Medical History:     Problem Relation (Age of Onset)    No Known Problems Mother, Father        SOCIAL HISTORY:   Social History     Occupational History   . Not on file   Tobacco Use   . Smoking status: Never   . Smokeless tobacco: Never   Vaping Use   . Vaping Use: Never used   Substance and Sexual Activity    . Alcohol use: Not Currently   . Drug use: Not Currently     Frequency: 5.0 times per week     Types: Marijuana     Comment: medical marijuana gel   . Sexual activity: Not on file           PHYSICAL EXAM:   General:  Appears stated age.  Well-appearing. Thin body habitus   Psychiatric:  Pleasant.  Cooperative.  Alert and oriented x 3.    Respiratory:  Lung expansion symmetric.  No active work of breathing.    Heme/Lymphatic:  No lymphadenopathy.    ENMT:  Sclera clear.  Trachea midline.  Vascular:  +2/4 dorsalis pedis and posterior tibial pulses, bilaterally.  Capillary fill time was less than 3 seconds, bilaterally.  Skin temperature was warm at the level of the toes, bilaterally.  There was no edema noted.   Absent hair growth noted to bilateral tibia.  Skin was of good turgor and texture.    Neurologic:  Gross sensation intact to touch, bilaterally.  No paresthesias noted, bilateral feet.    Dermatologic:  No open wounds or lesions were noted.  No ecchymosis or erythema noted, bilateral feet.  No areas of hyperpigmentation.  Nails are normotrophic, bilaterally.  No maceration noted to interspaces.    Musculoskeletal:  Right tailors bunion. Lesser toe adduction primarily L 3rd, 4th, 5th digit. Able to plantar flex lesser toes, but unable to extend digits 3,4, on left. Hallux valgus; more advanced on left. No crepitus noted.      RADIOGRAPHS:   1. Bilateral foot weight-bearing XR, 02/15/2022, personally reviewed and interpreted by myself:     Surgical changes noted to bilateral feet and toes.  Bilateral hallux valgus, Left 2nd toe absent, adduction of lesser digits with left more pronounced vs. Right.      ASSESSMENT:   1. 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   2. Adducted lesser digits bilateral with L more advanced than R.  3. Diffuse plantar fat  pad atrophy.    PLAN:   - Discussed clinical exam and radiologic findings and answered all questions.    - Discussed conservative vs  surgical treatment for above such as toe splints, topical NSAIDs, and supportive footwear.  Recommend conservative care  - Recommended silicone crest pad for L lesser digits.   - Recommended supportive, structured shoes such as Hokas.  - Recommended recovery sandals such as Oofos or Hokas.   - Dispensed toe trainer as trial.   - RTC prn.        This note may have been partially generated using MModal Fluency Direct system and there may be some incorrect words, spellings, and punctuation that were not noted in checking the note before saving.    I am scribing for, and in the presence of, Berniece Pap, DPM, for services provided on 02/15/2022.  Kyra Leyland, SCRIBE    I personally performed the services described in this documentation, as scribed  in my presence, and it is both accurate  and complete.    Berniece Pap, DPM    On the day of the encounter, a total of  30 minutes was spent on this patient encounter including review of historical information, examination, documentation and post-visit activities. The time documented excludes procedural time.      Curt Bears Aleene Davidson  Podiatry, Assistant Professor  Department of [facility]  Pager [phone]

## 2022-02-16 ENCOUNTER — Other Ambulatory Visit: Payer: Self-pay

## 2022-02-16 ENCOUNTER — Inpatient Hospital Stay
Admission: RE | Admit: 2022-02-16 | Discharge: 2022-02-16 | Disposition: A | Discharge status: Home / Self Care | Payer: Medicare PPO | Source: Ambulatory Visit | Attending: Neurology | Admitting: Neurology

## 2022-02-16 ENCOUNTER — Inpatient Hospital Stay (HOSPITAL_BASED_OUTPATIENT_CLINIC_OR_DEPARTMENT_OTHER)
Admission: RE | Admit: 2022-02-16 | Discharge: 2022-02-16 | Disposition: A | Discharge status: Home / Self Care | Payer: Medicare PPO | Source: Ambulatory Visit

## 2022-02-16 ENCOUNTER — Other Ambulatory Visit (HOSPITAL_BASED_OUTPATIENT_CLINIC_OR_DEPARTMENT_OTHER): Payer: Self-pay | Admitting: Internal Medicine

## 2022-02-16 ENCOUNTER — Encounter (HOSPITAL_BASED_OUTPATIENT_CLINIC_OR_DEPARTMENT_OTHER): Payer: Self-pay | Admitting: Internal Medicine

## 2022-02-16 DIAGNOSIS — M5136 Other intervertebral disc degeneration, lumbar region: Secondary | ICD-10-CM

## 2022-02-16 DIAGNOSIS — G4486 Cervicogenic headache: Secondary | ICD-10-CM

## 2022-02-16 DIAGNOSIS — G8929 Other chronic pain: Secondary | ICD-10-CM

## 2022-02-16 DIAGNOSIS — Z79891 Long term (current) use of opiate analgesic: Secondary | ICD-10-CM

## 2022-02-16 DIAGNOSIS — M1990 Unspecified osteoarthritis, unspecified site: Secondary | ICD-10-CM | POA: Insufficient documentation

## 2022-02-16 DIAGNOSIS — M5412 Radiculopathy, cervical region: Secondary | ICD-10-CM

## 2022-02-16 DIAGNOSIS — M47812 Spondylosis without myelopathy or radiculopathy, cervical region: Secondary | ICD-10-CM | POA: Insufficient documentation

## 2022-02-16 DIAGNOSIS — G43909 Migraine, unspecified, not intractable, without status migrainosus: Secondary | ICD-10-CM

## 2022-02-16 DIAGNOSIS — G43709 Chronic migraine without aura, not intractable, without status migrainosus: Secondary | ICD-10-CM

## 2022-02-16 DIAGNOSIS — G4440 Drug-induced headache, not elsewhere classified, not intractable: Secondary | ICD-10-CM

## 2022-02-16 MED ORDER — TOPIRAMATE 100 MG TABLET
100.0000 mg | ORAL_TABLET | Freq: Every day | ORAL | 3 refills | Status: DC
Start: 2022-02-16 — End: 2022-05-30
  Filled 2022-02-16 – 2022-03-07 (×2): qty 90, 90d supply, fill #0

## 2022-02-16 MED ORDER — NALOXONE 4 MG/ACTUATION NASAL SPRAY
1.0000 | NASAL | 0 refills | Status: DC | PRN
Start: 2022-02-16 — End: 2022-02-16
  Filled 2022-02-16: qty 2, 1d supply, fill #0

## 2022-02-16 MED ORDER — NALOXONE 4 MG/ACTUATION NASAL SPRAY
1.0000 | NASAL | 0 refills | Status: DC | PRN
Start: 2022-02-16 — End: 2023-02-20
  Filled 2022-02-16: qty 2, 1d supply, fill #0

## 2022-02-16 MED ORDER — GADOBUTROL 1 MMOL/ML (604.72 MG/ML) INTRAVENOUS SOLUTION
4.5000 mL | INTRAVENOUS | Status: AC
Start: 2022-02-16 — End: 2022-02-16
  Administered 2022-02-16: 4.5 mL via INTRAVENOUS

## 2022-02-16 NOTE — Progress Notes (Signed)
Received call from [facility] pharmacist wanting to speak to me about a situation regarding Narcan  Patient called in and asked to have Narcan filled because her PCP and spine clinic provider told her to have it on hand in case something happened  Husband went to go pick up her medications and the pharmacist wanted to fill narcan but patient refused stating "my wife has been on this for years so she does not need this medication". Pharmacist told me that he said the patient had called in requesting it and so she deserves the right to have it but the husband still refused.   I called Rachel [name] today and she confirmed that she does in fact want the narcan and so I called med center pharmacy to send it to her home address and asked her to look out for it. She says she feels safe at home and is not otherwise concerned but that her husband can be very stubborn. She also said that despite taking her oxycodone she is still in so much pain that she cannot walk. I recommended re evaluation with spine/pain clinic because I do not feel comfortable increasing her oxycodone dosage and was hoping to taper her off which has been exceedingly difficult.

## 2022-02-19 ENCOUNTER — Other Ambulatory Visit: Payer: Self-pay

## 2022-02-20 ENCOUNTER — Ambulatory Visit (INDEPENDENT_AMBULATORY_CARE_PROVIDER_SITE_OTHER): Payer: Medicare PPO

## 2022-02-21 DIAGNOSIS — M4322 Fusion of spine, cervical region: Secondary | ICD-10-CM

## 2022-02-21 DIAGNOSIS — M5412 Radiculopathy, cervical region: Secondary | ICD-10-CM

## 2022-02-21 DIAGNOSIS — M47812 Spondylosis without myelopathy or radiculopathy, cervical region: Secondary | ICD-10-CM

## 2022-02-22 ENCOUNTER — Ambulatory Visit (INDEPENDENT_AMBULATORY_CARE_PROVIDER_SITE_OTHER): Payer: Self-pay | Admitting: NURSE PRACTITIONER-ADULT HEALTH

## 2022-02-22 NOTE — Telephone Encounter (Signed)
Called patient per provider to deliver following information:    Please advise patient to keep NPV with Dr. Skeet Simmer as scheduled. Report to ER with any red flag symptoms     Pt states understanding. Advised to call clinic back with further questions/concerns.     Woodroe Chen, RN  02/22/2022, 10:18

## 2022-02-26 ENCOUNTER — Encounter (INDEPENDENT_AMBULATORY_CARE_PROVIDER_SITE_OTHER): Payer: Self-pay

## 2022-02-27 ENCOUNTER — Other Ambulatory Visit: Payer: Self-pay

## 2022-02-27 ENCOUNTER — Ambulatory Visit (INDEPENDENT_AMBULATORY_CARE_PROVIDER_SITE_OTHER): Payer: Self-pay | Admitting: Ophthalmology

## 2022-02-27 ENCOUNTER — Inpatient Hospital Stay
Admission: RE | Admit: 2022-02-27 | Discharge: 2022-02-27 | Disposition: A | Discharge status: Home / Self Care | Payer: Medicare PPO | Source: Ambulatory Visit | Attending: Diagnostic Radiology | Admitting: Diagnostic Radiology

## 2022-02-27 DIAGNOSIS — G43709 Chronic migraine without aura, not intractable, without status migrainosus: Secondary | ICD-10-CM | POA: Insufficient documentation

## 2022-02-27 DIAGNOSIS — G4486 Cervicogenic headache: Secondary | ICD-10-CM | POA: Insufficient documentation

## 2022-02-27 DIAGNOSIS — I6381 Other cerebral infarction due to occlusion or stenosis of small artery: Secondary | ICD-10-CM

## 2022-02-27 DIAGNOSIS — G4440 Drug-induced headache, not elsewhere classified, not intractable: Secondary | ICD-10-CM | POA: Insufficient documentation

## 2022-02-27 DIAGNOSIS — R9082 White matter disease, unspecified: Secondary | ICD-10-CM

## 2022-02-27 MED ORDER — GADOBUTROL 1 MMOL/ML (604.72 MG/ML) INTRAVENOUS SOLUTION
4.5000 mL | INTRAVENOUS | Status: AC
Start: 2022-02-27 — End: 2022-02-27
  Administered 2022-02-27: 4.5 mL via INTRAVENOUS

## 2022-02-28 DIAGNOSIS — Z89422 Acquired absence of other left toe(s): Secondary | ICD-10-CM

## 2022-02-28 DIAGNOSIS — M19071 Primary osteoarthritis, right ankle and foot: Secondary | ICD-10-CM

## 2022-02-28 DIAGNOSIS — Z9889 Other specified postprocedural states: Secondary | ICD-10-CM

## 2022-02-28 DIAGNOSIS — S93145A Subluxation of metatarsophalangeal joint of left lesser toe(s), initial encounter: Secondary | ICD-10-CM

## 2022-02-28 DIAGNOSIS — M19072 Primary osteoarthritis, left ankle and foot: Secondary | ICD-10-CM

## 2022-02-28 DIAGNOSIS — M205X2 Other deformities of toe(s) (acquired), left foot: Secondary | ICD-10-CM

## 2022-02-28 DIAGNOSIS — S93144A Subluxation of metatarsophalangeal joint of right lesser toe(s), initial encounter: Secondary | ICD-10-CM

## 2022-02-28 DIAGNOSIS — M205X1 Other deformities of toe(s) (acquired), right foot: Secondary | ICD-10-CM

## 2022-03-02 ENCOUNTER — Other Ambulatory Visit: Payer: Self-pay

## 2022-03-04 DIAGNOSIS — R9082 White matter disease, unspecified: Secondary | ICD-10-CM

## 2022-03-04 DIAGNOSIS — I6381 Other cerebral infarction due to occlusion or stenosis of small artery: Secondary | ICD-10-CM

## 2022-03-06 ENCOUNTER — Other Ambulatory Visit: Payer: Self-pay

## 2022-03-07 ENCOUNTER — Other Ambulatory Visit: Payer: Self-pay

## 2022-03-07 ENCOUNTER — Ambulatory Visit: Payer: Medicare PPO | Attending: NURSE PRACTITIONER-ADULT HEALTH | Admitting: Neurological Surgery

## 2022-03-07 ENCOUNTER — Encounter (INDEPENDENT_AMBULATORY_CARE_PROVIDER_SITE_OTHER): Payer: Self-pay | Admitting: Neurological Surgery

## 2022-03-07 VITALS — BP 133/63 | HR 91 | Temp 98.0°F | Ht 64.0 in | Wt 103.8 lb

## 2022-03-07 DIAGNOSIS — M542 Cervicalgia: Secondary | ICD-10-CM

## 2022-03-07 DIAGNOSIS — Z981 Arthrodesis status: Secondary | ICD-10-CM

## 2022-03-07 DIAGNOSIS — G43909 Migraine, unspecified, not intractable, without status migrainosus: Secondary | ICD-10-CM

## 2022-03-07 DIAGNOSIS — G8929 Other chronic pain: Secondary | ICD-10-CM | POA: Insufficient documentation

## 2022-03-07 DIAGNOSIS — R200 Anesthesia of skin: Secondary | ICD-10-CM

## 2022-03-07 DIAGNOSIS — M5136 Other intervertebral disc degeneration, lumbar region: Secondary | ICD-10-CM | POA: Insufficient documentation

## 2022-03-07 DIAGNOSIS — R519 Headache, unspecified: Secondary | ICD-10-CM | POA: Insufficient documentation

## 2022-03-07 DIAGNOSIS — M5412 Radiculopathy, cervical region: Secondary | ICD-10-CM | POA: Insufficient documentation

## 2022-03-07 NOTE — Progress Notes (Signed)
[facility]  New Outpatient/Consult    Rachel [name]  Date of Service: 03/07/2022  Referring physician: Vergie Living, NP  [place],  [place] [address]    Gender: female  Handedness: Right handed  Marital Status: Married   Job Title (or Former Job): Retired     Risk analyst Complaint:   Chief Complaint   Patient presents with   . New Patient     History is provided by patient    History of Present Illness  Rachel [name] is a 81 y.o. female who is here for a new patient visit through the [facility] to establish care and initiate work up if indicated for cervical radiculopathy h/o prior ACDF with MRI cervical spine done 02/16/22.  Prior spine surgeries: ACDF C5-C7 2022 in [place], Right rotator cuff surgery        Today's visit:  Today the patient is here for a new patient visit for evaluation of her cervical spine. The patient reports she moved to the area from [place] late last year, she had cervical spine surgery last year with some improvement for several months but has progressively worsened over past several months. Today she reports constant neck pain 5/10 that radiates to right shoulder and arm, she describes pain as sharp that is aggravated by moving right arm, looking up and down, side to side and relieved by nothing.  She also reports constant numbness/tingling to bilateral feet and bilateral finger tips, and weakness to BUE, and BLE, decreased grip bilaterally dropping things, worsening gait and balance issues with frequent falls, worsening bladder inconvenience.       Conservative treatments: She currently takes Oxycodone with moderate relief.       Past History    Current Outpatient Medications:   .  Butalbital-Acetaminophen-Caff 50-300-40 mg Oral Capsule, Take 1 Capsule by mouth Once per day as needed Indications: a migraine headache (Patient not taking: Reported on 03/07/2022), Disp: 8 Capsule, Rfl: 0  .  carboxymethylcellulose sodium (ARTIFICIAL TEARS,  CMC, OPHT), Administer into affected eye(s) Twice daily, Disp: , Rfl:   .  DEXILANT 60 mg Oral Cap, Delayed Rel., Multiphasic, Take 1 Capsule (60 mg total) by mouth Once a day, Disp: 90 Capsule, Rfl: 3  .  diclofenac sodium (VOLTAREN) 1 % Gel, Apply topically Three times a day (Patient not taking: Reported on 03/07/2022), Disp: 450 g, Rfl: 0  .  DULoxetine (CYMBALTA DR) 60 mg Oral Capsule, Delayed Release(E.C.), Take 1 Capsule (60 mg total) by mouth Once a day, Disp: 90 Capsule, Rfl: 3  .  fexofenadine HCl (ALLEGRA ORAL), Take 180 mg by mouth Every morning, Disp: , Rfl:   .  mometasone furoate (NASONEX NASL), Administer into affected nostril(s) Twice daily, Disp: , Rfl:   .  naloxone (NARCAN) 4 mg per spray nasal spray, Instill 1 Spray by INTRANASAL route Every 2 minutes as needed for actual or suspected opioid overdose. Call 911 if used., Disp: 2 Each, Rfl: 0  .  Non-Adherent Bandage (TELFA) 3 X 8 " Bandage, Apply Vaseline to wounds. Then, apply Telfa pad. Secure with gauze, Disp: 144 Each, Rfl: 3  .  Oxycodone (ROXICODONE) 10 mg Oral Tablet, Take 1 Tablet (10 mg total) by mouth Every 6 hours as needed for Pain for up to 30 days Indications: pain, osteoarthritis, Disp: 120 Tablet, Rfl: 0  .  topiramate (TOPAMAX) 100 mg Oral Tablet, Take 1 Tablet (100 mg total) by mouth Once  a day, Disp: 90 Tablet, Rfl: 3  Allergies   Allergen Reactions   . Latex    . Darvon [Propoxyphene] Mental Status Effect     Hallucinations   . Shellfish Derived      Past Medical History:   Diagnosis Date   . Chronic pain    . Depression    . GERD (gastroesophageal reflux disease)    . Low back pain    . Migraine    . Neck problem    . Osteoarthritis    . Osteoarthritis    . Shortness of breath    . Wears glasses          Past Surgical History:   Procedure Laterality Date   . ANTERIOR FUSION CERVICAL SPINE  12/30/2020   . FOOT SURGERY Bilateral    . HX BREAST AUGMENTATION Bilateral     x3, 1974, 1994, 2014   . HX CATARACT REMOVAL Bilateral  01/12/2021   . HX CERVICAL SPINE SURGERY     . HX ROTATOR CUFF REPAIR Right    . SACROILIAC JOINT INJECTION     . TOE AMPUTATION         Family History  Family Medical History:     Problem Relation (Age of Onset)    No Known Problems Mother, Father            Social History  Social History     Socioeconomic History   . Marital status: Married   . Number of children: 2   Occupational History   . Occupation: Retired   Tobacco Use   . Smoking status: Never   . Smokeless tobacco: Never   Vaping Use   . Vaping Use: Never used   Substance and Sexual Activity   . Alcohol use: Not Currently   . Drug use: Not Currently     Frequency: 5.0 times per week     Types: Marijuana     Comment: medical marijuana gel   Other Topics Concern   . Ability to Walk 1 Flight of Steps without SOB/CP Yes   . Routine Exercise No   . Ability To Do Own ADL's Yes   . Uses Walker No   . Other Activity Level Yes     Comment: light house work   . Uses Cane Yes   Social History Narrative    Right handed        Review of Systems  Other than ROS in the HPI, all other systems were negative.      Examination  BP 133/63   Pulse 91   Temp 36.7 C (98 F)   Ht 1.626 m ('5\' 4"'$ )   Wt 47.1 kg (103 lb 13.4 oz)   BMI 17.82 kg/m       Constitutional:Well groomed, in no apparent distress  HEENT: Normal  Cardiovascular:    Auscultation: Regular rate and rhythm  Respiratory: CTA bilaterally, Unlabored  Gastrointestinal: Not done  Hem/Lymph:  Deferred  Musculoskeletal  Gait and Station: Seated in wheelchair   Muscle strength (upper extremities): 5/5 bilaterally  Muscle strength (lower extremities): 5/5 bilaterally  Muscle tone (upper extremities): WNL  Muscle tone (lower extremities): WNL  Sensory: Sensory exam in the upper and lower extremities is normal  DTR's upper and lower extremities: 3+ Bilaterally   Hoffman's reflex: Present Right   Ankle clonus: Negative  Neurological  Level of consciousness: Alert and oriented  Recent and remote memory: Good recall and  able to follow commands  Attention span and concentration: Normal in conversation  Language/Speech: No aphasia or dysarthria  Fund of knowledge: Appropriate in this setting      Data reviewed  - Reviewed MRI SPINE CERVICAL W/WO CONTRAST with Dr. Skeet Simmer done 02/16/22 in [facility] radiology report:  IMPRESSION:   1. ACDF extending from C5 through C7. There is moderate narrowing of the thecal sac with slight cord contour change posterior to C5-6. No definitive cord edema is appreciated. Multilevel facet degenerative changes are appreciated.    - Outside records/ Previous charts reviewed    Discussions with other providers: Patient history and presentation were discussed with Dr. Skeet Simmer     Assessment:    ICD-10-CM    1. H/O ACDF with cervical radiculopathy  M54.12       2. Chronic pain  G89.29 Referral to Pain Clinic-       3. DDD (degenerative disc disease), lumbar  M51.36       4. Headache  R51.9 Referral to Neurology          Treatment Plan  -  IRJA WHELESS is in agreement with the following plan:    - The natural history, film findings, and indications for treatment were discussed.   - The patient is doing well clinically and her films remain stable per Dr. Skeet Simmer  - From a neurosurgical perspective, no surgery indicated at this time per Dr. Skeet Simmer   - Referral pain management for chronic neck pain   - Referral neurology for chronic headaches   - RTC PRN or sooner if problems develop.   - Continue Medical Management (Diet, Exercise, Medication).      - The patient has been advised to follow up with their PCP in regards any chronic medical conditions and any non-neurosurgical symptoms that they may have. A copy of the note from today's clinic appointment will be sent to the patient's PCP [name], MD on file (confirmed during visit)      The patient was seen as a shared visit with the co-signing faculty.    I independently of the faculty provider spent a total of 35 minutes in  direct/indirect care of this patient including initial evaluation, review of laboratory, radiology, diagnostic studies, review of medical record, order entry and coordination of care.    Neale Burly, APRN 03/07/2022, 14:33    With Dr. Skeet Simmer      I personally saw and evaluated the patient. See mid-level's note for additional details. My findings/participation are h/o ACDF C5-7 without significant adjacent level DDD non-surgical. AlosL BP    1. Refer to pain clinic for cerivcal and lumbar ESI/facet rhizotomy  2. Refer to [facility] for HA  3. FU prn.     Lindie Spruce, MD

## 2022-03-14 ENCOUNTER — Encounter (HOSPITAL_BASED_OUTPATIENT_CLINIC_OR_DEPARTMENT_OTHER): Payer: Self-pay | Admitting: Internal Medicine

## 2022-03-15 NOTE — Progress Notes (Unsigned)
INTERNAL MEDICINE, [place] [address]  Operated by [facility]  Medicare Annual Wellness Visit    Name: AIKAM HELLICKSON MRN:  [id number]   Date: 03/21/2022 Age: 81 y.o.     SUBJECTIVE:   Rachel [name] is a 81 y.o. female for presenting for Medicare Wellness exam.   I have reviewed and reconciled the medication list with the patient today.    Comprehensive Health Assessment:  Paper document [place] reviewed and scanned into medical record    I have reviewed and updated as appropriate the past medical, family and social history. 03/21/2022 as summarized below:  Past Medical History:   Diagnosis Date    Chronic pain     Depression     GERD (gastroesophageal reflux disease)     Low back pain     Migraine     Neck problem     Osteoarthritis     Shortness of breath     Wears glasses      Past Surgical History:   Procedure Laterality Date    Anterior fusion cervical spine  12/30/2020    Breast implant removal Bilateral     Foot surgery Bilateral     Hx breast augmentation Bilateral     Hx cataract removal Bilateral 01/12/2021    Hx cervical spine surgery      Hx rotator cuff repair Right     Sacroiliac joint injection      Toe amputation       Current Outpatient Medications   Medication Sig    DEXILANT 60 mg Oral Cap, Delayed Rel., Multiphasic Take 1 Capsule (60 mg total) by mouth Once a day    diclofenac sodium (VOLTAREN) 75 mg Oral Tablet, Delayed Release (E.C.) Take 1 Tablet (75 mg total) by mouth Twice daily Indications: joint damage causing pain and loss of function    DULoxetine (CYMBALTA DR) 60 mg Oral Capsule, Delayed Release(E.C.) Take 1 Capsule (60 mg total) by mouth Once a day    fexofenadine HCl (ALLEGRA ORAL) Take 180 mg by mouth Every morning    naloxone (NARCAN) 4 mg per spray nasal spray Instill 1 Spray by INTRANASAL route Every 2 minutes as needed for actual or suspected opioid overdose.  Call 911 if used.    Non-Adherent Bandage (TELFA) 3 X 8 " Bandage Apply Vaseline to wounds. Then, apply Telfa pad. Secure with gauze    topiramate (TOPAMAX) 100 mg Oral Tablet Take 1 Tablet (100 mg total) by mouth Once a day     Family Medical History:     Problem Relation (Age of Onset)    Arthritis-osteo Sister    Food Allergy Mother, Sister    No Known Problems Father, Maternal Grandfather, Paternal Grandmother, Paternal Grandfather, Daughter    Pneumonia Mother, Maternal Grandmother    Psoriasis Daughter          Social History     Socioeconomic History    Marital status: Married     Spouse name: Interior and spatial designer    Number of children: 2   Occupational History    Occupation: Retired   Tobacco Use    Smoking status: Never    Smokeless tobacco: Never   Vaping Use    Vaping Use: Never used   Substance and Sexual Activity    Alcohol use: Not Currently    Drug use: Not Currently     Frequency: 5.0 times per week  Types: Marijuana     Comment: medical marijuana gel    Sexual activity: Not Currently     Partners: Male   Other Topics Concern    Ability to Walk 1 Flight of Steps without SOB/CP Yes    Routine Exercise No    Ability To Do Own ADL's Yes    Uses Walker No    Other Activity Level Yes     Comment: light house work    Electronics engineer Yes   Social History Narrative    Right handed; married for 14 years; two daughters (fraternal twins).  Two grandson's and one adopted grandson.      Originally from [place], [place].  Previously lived in [place] for 25 years ([place] and [place]) prior to coming back to [place] in late 2022.     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         Twins are to her first husband (was married for 3 years).         Enjoyed needlepoint, knitting, golf, tennis, gardening, cooking (does not do any of them now)        Daughter's both live in [place], [place].             List of Current Health Care Providers   Care Team     PCP     Name  Type Specialty Phone Number    [name], MD Physician INTERNAL MEDICINE [phone]          Care Team     Name Type Specialty Phone Number    Ginette Pitman, [facility] Nurse Practitioner INTERNAL MEDICINE [phone]    Ernesta Amble, [place] Physician PODIATRIST-GENERAL PRACTICE [phone]    Berniece Pap, [place] Physician PODIATRIST-GENERAL PRACTICE [phone]    Sheppard Plumber, MD FACS Physician VASCULAR SURGERY [phone]    Lucretia Field, MD Physician OPHTHALMOLOGY [phone]    Billie Ruddy, RDLD Dietitian DIETICIAN-REGISTERED [phone]    Lindie Spruce, MD Physician NEUROLOGICAL SURGERY [phone]    Raphael Gibney, [place] Psychologist CLINICAL NEUROPSYCHOLOGIST [phone]                  Health Maintenance   Topic Date Due    Adult Tdap-Td (2 - Td or Tdap) 10/03/2021    Depression Screening  03/22/2023    Shingles Vaccine  Completed    Covid-19 Vaccine  Completed    Annual Wellness Visit - Calendar Year Insurers  Completed    Pneumococcal Vaccination, Age 58+  Completed    Meningococcal Vaccine  Aged Out    Osteoporosis screening  Discontinued     Medicare Wellness Assessment   Medicare initial or wellness physical in the last year?: No  Advance Directives (optional)   Does patient have a living will or MPOA: YES   Has patient provided [employer] with a copy?: YES   Advance directive information given to the patient today?: no      Activities of Daily Living   Do you need help with dressing, bathing, or walking?: Yes (with walking)   Do you need help with shopping, housekeeping, medications, or finances?: Yes   Do you have rugs in hallways, broken steps, or poor lighting?: No   Do you have grab bars in your bathroom, non-slip strips in your tub, and hand rails on your stairs?: No   Urinary Incontinence Screen (Women >=65 only)   Do you ever leak urine  when you don't want to?: YES   Cognitive Function Screen (1=Yes, 0=No)   What is you age?: Correct    What is the time to the nearest hour?: Correct   What is the year?: Correct   What is the name of this clinic?: Correct   Can the patient recognize two persons (the doctor, the nurse, home help, etc.)?: Correct   What is the date of your birth? (day and month sufficient) : Correct   In what year did World War II end?: Incorrect   Who is the current president of the [place]?: Correct   Count from 20 down to 1?: Correct   What address did I give you earlier?: Incorrect   Total Score: 8   Interpretation of Total Score: Greater than 6 Normal   Hearing Screen   Have you noticed any hearing difficulties?: No  After whispering 9-1-6 how many numbers did the patient repeat correctly?: 3   Fall Risk Screen   Do you feel unsteady when standing or walking?: Yes  Do you worry about falling?: Yes  Have you fallen in the past year?: Yes  How many times have you fallen?: 2 or more times  Were you ever injured from falling?: No   Vision Screen   Right Eye = 20: 50   Left Eye = 20: 30   Depression Screen   Little interest or pleasure in doing things.: Not at all  Feeling down, depressed, or hopeless: Not at all  PHQ 2 Total: 0   Pain Score   Pain Score:   6    Substance Use-Abuse Screening   Tobacco Use   In Past 12 MONTHS, how often have you used any tobacco product (for example, cigarettes, e-cigarettes, cigars, pipes, or smokeless tobacco)?: Never   Alcohol use   In the PAST 12 MONTHS, how often have you had 5 (men)/4 (women) or more drinks containing alcohol in one day?: Never   Prescription Drug Use   In the PAST 12 months, how often have you used any prescription medications just for the feeling, more than prescribed, or that were not prescribed for you? Prescriptions may include: opioids, benzodiazepines, medications for ADHD: Never         Illicit Drug Use   In the PAST 12 MONTHS, how often have you used any drugs, including marijuana, cocaine or crack, heroin, methamphetamine, hallucinogens, ecstasy/MDMA?: Never           OBJECTIVE:   BP 124/68 (Site: Left, Patient Position: Sitting, Cuff Size: Adult)    Pulse 90    Temp 36.5 C (97.7 F) (Temporal)    Ht 1.618 m (5' 3.7")    Wt 48 kg (105 lb 13.1 oz)    SpO2 99%    BMI 18.33 kg/m      General: appears in stable health, stated age, no apparent distress  Mental Status: alert, awake, aware of self and environment, cooperative. Speech fluent; words clear. Oriented to person, place, time.  HEENT: skull normocephalic.   Thorax and Lungs: chest symmetric with good expansion; respirations regular, even, unlabored at rest.   Gait with normal base.   Psych: congruent mood and affect, communicative, thought process coherent and insight is good.   Skin: warm, dry.     Other appropriate exam:  Wt Readings from Last 10 Encounters:   03/21/22 48 kg (105 lb 13.1 oz)   03/21/22 48 kg (105 lb 13.1 oz)   03/07/22 47.1 kg (103 lb 13.4  oz)   02/15/22 47.2 kg (104 lb)   01/17/22 47.2 kg (104 lb 0.9 oz)   01/09/22 49.5 kg (109 lb 2 oz)   01/08/22 47.2 kg (104 lb 0.9 oz)   12/27/21 47.2 kg (104 lb)   12/27/21 47.3 kg (104 lb 4.4 oz)   12/14/21 48.5 kg (106 lb 14.8 oz)     Body mass index is 18.33 kg/m.    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)  Plan: noted; follows with neuro    (E46) Malnutrition, unspecified type (CMS [place])  Plan: noted; continue dexilant, and follows with nutritionist    (R41.3) Memory deficit  Plan: noted; was referred to neuropsych for testing    (H54.7) Visual  impairment  Plan: noted; f/u with ophthalmology; up to date with screenings    (M19.90) Osteoarthritis, unspecified osteoarthritis type, unspecified site  Plan: noted; takes oxycodone; use as directed, and continue Cymbalta as directed    (R13.10) Dysphagia, unspecified type  Plan: noted; stable    (G43.909) Migraine  Plan: noted; continue topamax as directed    Fall Risk Follow up plan of care: Vitamin D supplementation advised  Discussed optimizing home safety  Footwear and potential problems addressed    Opioid use plan of care:         Opioids use: Plan: Assessment of pain completed and pain controlled and Assessment of pain is completed. Advised to follow up with opioid prescribing provider.        No orders of the defined types were placed in this encounter.         The patient has been educated about risk factors and recommended preventive care. Written Prevention Plan completed/ updated and given to patient (see After Visit Summary).    Return in 1 year (on 03/22/2023) for Medicare Well Visit.      Bruna Potter, DNP, FNP-C  INTERNAL MEDICINE, [facility]  Operated by [facility]  [place] [address]  Dept: [phone]  Dept Fax: [phone]

## 2022-03-15 NOTE — Patient Instructions (Addendum)
Medicare Preventive Services  Medicare coverage information Recommendation for YOU   Heart Disease and Diabetes   Lipid profile Every 5 years or more often if at risk for cardiovascular disease  Last Lipid Panel  (Last result in the past 2 years)        Cholesterol   HDL   LDL   Direct LDL   Triglycerides      09/13/21 1241 230   72   125  Comment: <100 mg/dL, Optimal  100-129 mg/dL, Near/Above Optimal  130-159 mg/dL, Borderline High  160-189 mg/dL, High  >=190 mg/dL, Very high     167             Diabetes Screening  yearly for those at risk for diabetes, 2 tests per year for those with prediabetes Last Glucose: 90   Lab Results   Component Value Date    HA1C 6.1 (H) 09/13/2021        Diabetes Self Management Training or Medical Nutrition Therapy  For those with diabetes, up to 10 hrs initial training within a year, subsequent years up to 2 hrs of follow up training Optional for those with diabetes     Medical Nutrition Therapy Three hours of one-on-one counseling in first year, two hours in subsequent years Optional for those with diabetes, kidney disease   Intensive Behavioral Therapy for Obesity  Face-to-face counseling, first month every week, month 2-6 every other week, month 7-12 every month if continued progress is documented Optional for those with Body Mass Index 30 or higher  Your Body mass index is 18.33 kg/m.     Tobacco Cessation (Quitting) Counseling   Two attempts per year, max 4 sessions per attempt, up to 8 per year, for those with tobacco-related health condition Optional for those that use tobacco  N/A   Cancer Screening   Colorectal screening   For anyone age 71 to 61 or any age if high risk:  Screening Colonoscopy every 10 yrs if low risk,  more frequent if higher risk  OR  Cologuard Stool DNA test once every 3 years OR  Fecal Occult Blood Testing yearly OR  Flexible  Sigmoidoscopy  every 5 yr OR  CT Colonography every 5 yrs    See your schedule below  Optional after age 36     Screening Pap  Test Recommended every 3 years for all women age 92 to 69, or every five years if combined with HPV test (routine screening not needed after total hysterectomy).  Medicare covers every 2 years, up to yearly if high risk.  Screening Pelvic Exam Medicare covers every 2 years, yearly if high risk or childbearing age with abnormal Pap in last 3 yrs. See your schedule below  N/A   Screening Mammogram   Recommended every 2 years for women age 71 to 74, or more frequent if you have a higher risk. Selectively recommended for women between 40-49 based on shared decisions about risk. Covered by Medicare up to every year for women age 71 or older See your schedule below  None on file  Declines further screenings    Lung Cancer Screening  Annual low dose computed tomography (LDCT scan) is recommended for those age 9-77 who smoked 20 pack-years and are current smokers or quit smoking within past 15 years (one pack-year= smoking one PPD for one year), after counseling by your doctor or nurse clinician about the possible benefits or harms. See your schedule below  N/A   Vaccinations  Pneumococcal Vaccine: Recommended routinely age 41+ with one or two separate vaccines based on your risk    Recommended before age 9 if medical conditions with increased risk  Seasonal Influenza Vaccine: Once every flu season   Hepatitis B Vaccine: 3 doses if risk (including anyone with diabetes or liver disease)  Shingles Vaccine: Two doses at age 92 or older  Diphtheria Tetanus Pertussis Vaccine: ONCE as adult, booster every 10 years     Immunization History   Administered Date(s) Administered    Covid-19 Vaccine,Moderna Bivalent Booster,32yr+ 09/13/2021    Covid-19 Vaccine,Moderna,12 Years+ 02/14/2020, 03/12/2020, 11/14/2020    High-Dose Influenza Vaccine, 65+ 10/25/2015, 11/04/2015, 09/23/2016, 09/23/2017, 10/01/2018, 11/02/2019    INFLUENZA VIRUS VACCINE (ADMIN) 09/15/2016    Influenza Vaccine, 6 month-adult 09/24/2007, 10/05/2010,  12/20/2012, 11/24/2013, 09/30/2018, 09/23/2020    Influenza Vaccine, 65+ 11/12/2020    PREVNAR 13 11/04/2015    Pneumovax 10/03/2021    Shingrix - Zoster Vaccine 06/19/2017, 10/03/2021    Tetanus Toxoid/Diphtheria Toxoid/Acellular Pertussis Vaccine, Adsorbed (Tdap) 10/04/2011    ZOSTAVAX (VARICELLA ZOSTER VACCINE) 06/04/2007   Due for Tdap booster (see below)    Shingles vaccine and Diphtheria Tetanus Pertussis vaccines are available at pharmacies or local health department without a prescription.   Other Screening   Bone Densitometry   Every 24 months for anyone at risk, including postmenopausal    None on file  Declines any further screenings   Glaucoma Screening   Yearly if in high risk group such as diabetes, family history, African American age 80+or Hispanic American age 80+  Last eye exam: 02/06/2022  Scheduled for 04/2022   Hepatitis C Screening recommended ONCE for those born between 1945-1965, or high risk for HCV infection  None on file     HIV Testing recommended routinely at least ONCE, covered every year for age 1167to 635regardless of risk, and every year for age over 676who ask for the test or higher risk  Yearly or up to 3 times in pregnancy      N/A   Abdominal Aortic Aneurysm Screening Ultrasound   Once between the age of 634-75with a family history of AAA  N/A     Your Personalized Schedule for Preventive Tests     Health Maintenance: Pending and Last Completed         Date Due Completion Date    Osteoporosis screening Never done ---    Adult Tdap-Td (2 - Td or Tdap) 10/03/2021 10/04/2011    Depression Screening 12/06/2022 12/06/2021               Non-Opioid Treatment for Chronic Pains     Treatment for chronic pain can be managed without opioids. Below are non-opioid options that may be considered and discussed with your provider to determine which options would be best for your health.    Over the counter or presciptions medications:  Acetaminophen (Tylenol) or Non-steroidal anti-inflammatories  such as: Ibuprofen (Motrin, Advil), naproxen (Aleve), aspirin  Antidepressants such as amitriptyline, nortriptyline (Pamelor),  Doxepin (Silenor), Imipramine (Tofranil) and others.  Anticonvulsant Nerve pain medications: Gabapentin (Neurontin), pregabalin (Lyrica)  Externally applied medications such as NSAID'S, lidocaine, capsaisin, and others  Injections: pain specialists can sometimes inject medications at the site of pain.    Alternative therapies such as  Acupuncture  Osteopathic manipulation  Chiropractic  Massage therapy     The Personalized Prevention Plan from today's visit was reviewed, explained and discussed in detail with MEthelene [name]  Medications were also reviewed today and they were reconciled with Rachel [name]. All questions were answered and no further concerns were expressed. Rachel [name] is in agreement with the current prevention plan as detailed above.    Thank you for completing your Annual Medicare Wellness visit today. See you next year!     Ginette Pitman, DNP, FNP-C  [place]  [place] [address]  [phone]  [email or web address]    BMI Categories:   Underweight = <18.5  Normal weight = 18.5-24.9   Overweight = 25-29.9   Obesity = BMI of 30 or greater   Wt Readings from Last 10 Encounters:   03/21/22 48 kg (105 lb 13.1 oz)   03/21/22 48 kg (105 lb 13.1 oz)   03/07/22 47.1 kg (103 lb 13.4 oz)   02/15/22 47.2 kg (104 lb)   01/17/22 47.2 kg (104 lb 0.9 oz)   01/09/22 49.5 kg (109 lb 2 oz)   01/08/22 47.2 kg (104 lb 0.9 oz)   12/27/21 47.2 kg (104 lb)   12/27/21 47.3 kg (104 lb 4.4 oz)   12/14/21 48.5 kg (106 lb 14.8 oz)   Body mass index is 18.33 kg/m.  HOW TO CHOOSE A HEALTHY LIFESTYLE  1. NUTRITION and WEIGHT:   -keep track and maintain a healthy weight by eating right and being physically active  -eat a variety of foods, including vegetables (dark-green leafy and deep-yellow, and legumes),  fruits, dried beans, whole grains  -limit saturated fat (dairy, meats, and oils)  -watch portion sizes  2. PHYSICAL ACTIVITY  -aerobic: 30 minutes of moderate intensity exercise on 5 days each week (brisk walking, raking leaves, house cleaning, dancing, swimming, or biking)  -10 minutes of stretching of major muscle groups to maintain range of motion  -balance training  -the more active you are, the healthier you will become   3. PREVENTION  -stay up to date on your immunizations and screenings for cancer and bone health  -stop smoking  -avoid alcohol intake  -don't use illegal (street) drugs of any kind  -avoid falls  -stay up to date with dental checkups   -stay up to date with blood pressure, cholesterol, and blood sugar checks  4. MEDICATION USE  -maintain an up-to-date medication list, including over-the-counters and herbals  5. HOME SAFETY  -assess your home regularly for falling, poisoning, fire, and suffocation hazards  6. FINANCIAL AND SOCIAL SUPPORT  -most communities have assistance available through [employer] or other municipal organizations: information can be gathered online at MarketingGang.com.ee.  7. ELDER MISTREATMENT  -report to your provider any type of physical abuse (physical, sexual, psychological, financial, neglect)  8. DRIVING  -stay up to date on your visual acuity, hearing, psychomotor skills, and cognitive screenings  9. ADVANCE DIRECTIVES AND HEALTH CARE PROXY  - Powers of attorney, living wills, advanced directives, and guardianship documents become important if you become unable to participate in financial or health care decisions  10. MEDICARE AND PREVENTIVE VISITS  -stay up to date with your annual Medicare preventive visits, which are focused on identifying and mitigating health risks and educating you about available preventive services  11.  RESOURCES  -"The Pocket Guide to Staying Healthy at 50+":  https://assets.NetMemorabilia.com.cy.pdf  -VIPDealers.com.au.  -https://ellis-tucker.biz/  -www.https://www.cervantes-rivera.net/.    -TaxHiking.com.br    -https://growyoungfitness.com/exercises-for-seniors    The [employer] Program:    The [employer] program is a  nationwide program offered by several insurance agencies for their retirees: https://www.silversneakers.com.   Silver[employer]ne[employer] is the nation's leading fitness program for older adults. It offers programs and classes, which are designed specifically for all fitness levels, age levels within the Medicare population, those new to fitness, and much more.   Millions of people are eligible for the Silver[employer]ne[employer] benefit through leading Medicare Advantage health plans, Medicare Supplement carriers and group retiree plans. For the majority of these members, the program is available at no extra cost.   [employer] Client Relations:   -Phone: 2263960996  -Email: [email or web address]  Patient information: Pelvic muscle (Kegel) exercises (The Basics)   What are pelvic muscle exercises? -- Pelvic muscle exercises are exercises that strengthen the muscles that control the flow of urine and bowel movements. These exercises are also known as "Kegel" exercises. They can help keep you from leaking urine, gas, or bowel movements, if leaks are a problem for you. They can also help with a condition that affects women called "pelvic organ prolapse." In women who have pelvic organ prolapse, the organs in the lower belly drop down and press against or bulge into the vagina.   How do I learn how to do Kegel exercises? -- First ask your doctor or nurse how to do them right. He or she can help you get started.  You will need to learn which muscles to tighten. It is sometimes hard to figure out the right muscles.   A woman might learn to do  Kegel exercises by:   ?Putting a finger inside her vagina and squeezing the muscles around her finger; or   ?Pretending that she is sitting on a marble and has to pick up the marble using her vagina  A man might learn to do Kegels by tightening his butt muscles as if he were holding back gas.   Both men and women can also learn to do Kegel exercises by stopping and starting the flow of urine. If you do this, make sure to do this only once or twice to figure out the correct muscles. Some doctors think you should not do this at all, because if you get in the habit of doing it, it could cause a bladder infection.   No matter how you learn to do Kegel exercises, the important thing to know is that the muscles involved are not in your belly or your thighs.   After you learn which muscles to tighten, you can do the exercises in any position (sitting in a chair or lying down). You do not need to do them while you are in the bathroom.   How often should I do the exercises? -- Do the exercises 3 times a day, on 3 or 4 days a week. Each time, flex your muscles 8 to 12 times, and hold them tight for 6 to 8 seconds each time you tighten. Keep up this routine for at least 3 to 4 months. You will probably notice results, but it might take a little time.  How do Kegel exercises help? -- Kegel exercises can help:  ?Reduce urine leaks in people who have "stress incontinence," which means they leak urine when they cough, laugh, sneeze, or strain  ?Control sudden urges to urinate that happen to people with "urgency incontinence." (Urgency incontinence is also known as urge incontinence.)  ?Control the release of gas or bowel movements  ?Reduce pressure or bulging in the vagina caused by pelvic organ prolapse. (If you have a bulge in  the vagina, see your doctor or nurse to find out the cause.)  Kegel exercises might also reduce urine leaks in men who have had surgery to treat prostate cancer or an enlarged prostate.   Patient  information: Preventing falls (The Basics)   Am I at risk of falling? -- Your risk of falling increases as you grow older. That's because getting older can make it harder to walk steadily and keep your balance. Also, the effects of falls are more serious in older people.  Overall, 3 to 4 out of every 10 people over the age of 59 fall each year. Up to 69 percent of people who fracture a hip never recover to the point they were before they had their fracture. If you have fallen in the past, you are at higher risk of falling again.  Several things can increase your risk of a fall, including:  ?Illness  ?A change in the medicines you take  ?An unsafe or unfamiliar setting (for example, a room with rugs or furniture that might trip you, or an area you don't know well)  How can my doctor help me to avoid falling? -- Your doctor can talk to you about the following things:  ?Past falls - It is important to tell your doctor about any times you have fallen or almost fallen. He or she can then suggest ways to prevent another fall.  ?Your health conditions - Some health problems can put you at risk of falling. These include conditions that affect eyesight, hearing, muscle strength, or balance.  ?The medicines you take - Certain medicines can increase the risk of falling. These include some medicines that are used for sleeping problems, anxiety, or depression. Adding new medicines, or changing doses of some medicines, can also affect your risk of falling.  The more your doctor knows about your situation, the better he or she will be able to help you. For example, if you fell because you have a condition that causes pain, your doctor might suggest treatments to deal with the pain. Or if one of your medicines is making you dizzy and more likely to fall, your doctor might switch you to a different medicine.  Is there anything I can do on my own? -- Yes. To help keep from falling, you can:  ?Make your home safer - To avoid falling  at home, get rid of things that might make you trip or slip. This might include furniture, electrical cords, clutter, and loose rugs. Keep your home well lit so that you can easily see where you are going. Avoid storing things in high places so you don't have to reach or climb.  ?Wear sturdy shoes that fit well - Wearing shoes with high heels or slippery soles, or shoes that are too loose, can lead to falls. Walking around in bare feet, or only socks, can also increase your risk of falling.  ?Take vitamin D pills - Taking vitamin D might lower the risk of falls in older people. This is because vitamin D helps make bones and muscles stronger. Your doctor can help you decide how much vitamin D to take.  ?Stay active - Exercising on a regular basis can help lower your risk of falling. It might also help prevent you from getting hurt if you do fall. It is best to do a few different activities that help with both strength and balance. There are many kinds of exercise that can be safe for older people. These include walking,  swimming, and Tai Chi (a [place] martial art that involves slow, gentle movements).  ?Use a cane, walker, and other safety devices - If your doctor recommends that you use a cane or walker, be sure that it's the right size and you know how to use it. There are other devices that might help you avoid falling, too. These include grab bars or a sturdy seat for the shower, non-slip bath mats, and hand rails or treads for the stairs (to prevent slipping).  If you worry that you could fall, there are also alarm buttons that let you call for help if you fall and can't get up.  What should I do if I fall? -- If you fall, see your doctor right away, even if you aren't hurt. Your doctor can try to figure out what caused you to fall, and how likely you are to fall again. He or she will do an exam and talk to you about your health problems, medicines, and activities. Then he or she can suggest things you can do  to avoid falling again.  Many older people have a hard time recovering after a fall. Doing things to prevent falling can help you to protect your health and independence.

## 2022-03-16 ENCOUNTER — Ambulatory Visit: Payer: Medicare PPO | Attending: DERMATOLOGY | Admitting: REGISTERED DIETITIAN OR NUTRITION PROFESSIONAL

## 2022-03-16 ENCOUNTER — Other Ambulatory Visit: Payer: Self-pay

## 2022-03-16 DIAGNOSIS — R7309 Other abnormal glucose: Secondary | ICD-10-CM | POA: Insufficient documentation

## 2022-03-16 DIAGNOSIS — R634 Abnormal weight loss: Secondary | ICD-10-CM | POA: Insufficient documentation

## 2022-03-16 DIAGNOSIS — R630 Anorexia: Secondary | ICD-10-CM | POA: Insufficient documentation

## 2022-03-16 DIAGNOSIS — E7800 Pure hypercholesterolemia, unspecified: Secondary | ICD-10-CM | POA: Insufficient documentation

## 2022-03-16 DIAGNOSIS — R627 Adult failure to thrive: Secondary | ICD-10-CM | POA: Insufficient documentation

## 2022-03-16 DIAGNOSIS — R636 Underweight: Secondary | ICD-10-CM | POA: Insufficient documentation

## 2022-03-16 DIAGNOSIS — R1310 Dysphagia, unspecified: Secondary | ICD-10-CM | POA: Insufficient documentation

## 2022-03-16 DIAGNOSIS — R7303 Prediabetes: Secondary | ICD-10-CM | POA: Insufficient documentation

## 2022-03-16 NOTE — Progress Notes (Signed)
[facility]  [facility]  [address] [place], [place] [address]  [id number]    Patient Name:  Rachel [name]   MRN:  [id number]  DOB:  28-Jan-1941  Date of Service: 03/16/2022  PCP: [name], MD    ASSESSMENT   Rachel [name] is a 81 y.o. female referred by Powers, Roxann L, MD for nutrition assessment and diet education related to unintentional weight loss with poor appetite and Underweight BMI.    Current Outpatient Medications   Medication Sig Dispense Refill   . Butalbital-Acetaminophen-Caff 50-300-40 mg Oral Capsule Take 1 Capsule by mouth Once per day as needed Indications: a migraine headache (Patient not taking: Reported on 03/07/2022) 8 Capsule 0   . carboxymethylcellulose sodium (ARTIFICIAL TEARS, CMC, OPHT) Administer into affected eye(s) Twice daily     . DEXILANT 60 mg Oral Cap, Delayed Rel., Multiphasic Take 1 Capsule (60 mg total) by mouth Once a day 90 Capsule 3   . diclofenac sodium (VOLTAREN) 1 % Gel Apply topically Three times a day (Patient not taking: Reported on 03/07/2022) 450 g 0   . DULoxetine (CYMBALTA DR) 60 mg Oral Capsule, Delayed Release(E.C.) Take 1 Capsule (60 mg total) by mouth Once a day 90 Capsule 3   . fexofenadine HCl (ALLEGRA ORAL) Take 180 mg by mouth Every morning     . mometasone furoate (NASONEX NASL) Administer into affected nostril(s) Twice daily     . naloxone (NARCAN) 4 mg per spray nasal spray Instill 1 Spray by INTRANASAL route Every 2 minutes as needed for actual or suspected opioid overdose. Call 911 if used. 2 Each 0   . Non-Adherent Bandage (TELFA) 3 X 8 " Bandage Apply Vaseline to wounds. Then, apply Telfa pad. Secure with gauze 144 Each 3   . topiramate (TOPAMAX) 100 mg Oral Tablet Take 1 Tablet (100 mg total) by mouth Once a day 90 Tablet 3     No current facility-administered medications for this visit.     Patient Active Problem List   Diagnosis   . Healthcare maintenance   . Visual impairment   . Dysphagia    . Hypoglycemia   . Malnutrition (CMS [place])   . Osteoarthritis   . Migraine   . Left-sided chest wall pain   . Pre-syncope   . Spider veins   . Thick nasal mucus   . uds 09/29/21 11/23/21, 12/14/2021   . Memory deficit   . Cervicogenic headache   . Medication overuse headache   . 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 Foot pain, bilateral   . History of foot surgery   . Toe pain, bilateral   . Tailor's bunion of right foot   . Valgus deformity of both great toes   . Fat pad atrophy of foot     Allergies   Allergen Reactions   . Latex    . Darvon [Propoxyphene] Mental Status Effect     Hallucinations   . Shellfish Derived      Vitals: 03/07/22  Ht:1.626 m ('5\' 4"'$ )  Wt:47.1 kg (103 lb 13.4 oz)  BMI:17.82 kg/m    Weight History:   10/18/21 11/08/21 12/14/21 01/17/22 02/15/22 03/07/22   Weight (lbs) 106   105  106 104  104 103     Nutrition-Related Lab Values of Interest:   Date 09/13/21   HgbA1c 6.1   CHOL 230   HDL 72   LDL 125  TRIG 167     Appointment #: 1st     Chief Complaint: Unintentional weight loss related to dysphagia and loss of appetite.   Social: Retired. Lives with husband. Recently moved back to [facility] from [place]. Has 1 child locally.   Weight History: Weight down 3 lbs x 5 months. Reports typical body weight ~120 lbs.   Diet: Vadie describes her appetite as poor. She prefers to snack than eat large meals. She has difficulty swallowing many foods. Her favorite snack is caramel candies. She supplements her diet with Ensure Plus regularly.    Physical Activity: Active with yard work and dog, but suffers from recurrent falls.   Stage of Change: Contemplative. Chee was receptive to the information provided, and voices motivation for change.     DIAGNOSIS   Underweight related to inadequate energy intake as evidenced by patient dietary recall and Underweight (BMI less than 18.5).    INTERVENTION   Reviewed and provided patient with my "High Calorie, High Protein Diet" education, which  includes:   Tips for promoting a good appetite.   Recommended small, frequent meals.    Encouraged patient to keep favorite snacks out and visible at home.   Tips for adding Calories including Protein and Fats to diet.   High calorie meal and snack ideas.   List of recommended Nutritional Supplements to promote weight gain.   Encouraged intake of at least 1 Ensure/day, and provided patient with 1 case of Vanilla Ensure Original supplements.   List of "Calorie Boosters" to incorporate into regular diet:  Calorie Boosters   Food Item 100 Calorie Equivalent Serving Suggestions   Avocado    cup cubed Avocado  3 Tbsp Guacamole Slice for salads and sandwiches.  Make guacamole and enjoy as a dip for tortilla chips or raw vegetables.   Butter or Margarine 1 Tbsp Mix it into soups, potatoes, hot cereals, vegetables, rice, pasta, and sauces.  Spread on bread and muffins.  Combine with herbs/seasonings and spread on meats, chicken or fish while cooking.  Butter bread and toast when making sandwiches.   Cheeses, all varieties 1 slice American, Cheddar, Swiss, etc.   cup Ricotta    cup Feta   cup Parmesan Eat as a snack alone or with crackers.  Melt on top of casseroles, vegetables, rice, noodles, and soups.  Add to eggs, burgers, and sandwiches.  Wisk Ricotta cheese into eggs for rich and fluffy scrambled eggs and omelets.   Cottage Cheese  cup Enjoy with fruit such as peaches or pineapple.   Cream Cheese 2 Tbsp Regular cream cheese  4 Tbsp Whipped cream cheese Spread on toasted breads or bagels.  Use as a dip for raw vegetables, pretzels or crackers.  Shape into balls and coat with chopped nuts or granola.  Soften and combine with brown sugar to use as a topping for cakes, fruit, breads and muffins.   Coconut Oil 1 Tbsp Heat and drizzle over fresh fruit.   Cream 2 Tbsp Heavy whipping cream  1/3 cup Half and half   Add a splash to hot or cold cereals.  Use beverages such as milkshakes, smoothies and hot  chocolate.  Use in place of milk for cream soups and sauces.  Mix into mashed potatoes.   Dried Fruit    cup Raisins   cup Banana Chips Have as a snack alone or mixed with nuts or granola.  Add to cereals, oatmeal, salads, baked goods, rice, or stuffing.   Dry Pudding Mix  envelope Add to dry mixes when baking cakes, muffins and breads.  Add to shakes and smoothies.     Eggs 1 egg Add chopped, hard-cooked eggs to salads, casseroles, meat loaf, and macaroni and cheese.   Add extra eggs to [place] toast, pancake batter, custards, quiches, and puddings before cooking.   Granola  cup Use as a topping for cereal, fruit or yogurt.   Mix with dried fruits and nuts to make trail mix.  Layer with sliced fresh fruits and bake.   [place] Yogurt (Whole Milk)  cup Enjoy as a snack with fresh fruit and granola.  Mix with peanut butter to make a fruit dip.  Add to shakes and smoothies.   Hot Fudge Topping 2 Tbsp Drizzle over desserts including ice cream   Hummus 3 Tbsp Add to salads and sandwiches.  Enjoy as a dip for pretzels or raw vegetables.   Ice Cream and Frozen Yogurt 1/3 cup Ice cream   cup Frozen yogurt Blend with fruit or chocolate syrup to make milkshakes.  Sandwich between cake slices, cookies, or graham crackers.  Add to carbonated beverages like ginger ale or soda.   Mayonnaise/Salad Dressing 1 Tbsp Use liberally on sandwiches.  Use to prepare chicken, tuna, or egg salads.    Nuts  oz mixed nuts  2 Tbsp chopped nut dessert topping Enjoy as a snack.  Mix with dried fruit and granola to make trail mix.  Sprinkle on fruit, cereal, ice cream, yogurt, salads, and toast as a crunchy topping.  Use in place of breadcrumbs.   Olives 12 [place] olives Add to salads.   Mix olives into casseroles and pasta dishes.   Peanut Butter (or any nut butter) 1 Tbsp Spread on sandwiches, toast, muffins, crackers, waffles, and pancakes.  Use as a dip for fresh fruit and vegetables.  Add to smoothies.  Swirl through soft ice cream  and yogurt.  Make a sauce with soy sauce and lime juice for stir-fries.   Powdered Whole Milk or Powdered Coffee Creamer 3 Tbsp powdered milk  2.5 Tbsp powdered coffee creamer Fortify milk, milkshakes, or eggnog.  Mix into puddings, potatoes, soups, casseroles, ground meats, vegetables, hot cereal, yogurt, and pancake batter.     [place] Dressing 2 Tbsp Enjoy as a dip for raw vegetables, chicken or pizza.   Sour Cream 3 Tbsp  Use as a dip for raw vegetables and chips.  Add to gravies, potatoes, salad dressings, baked goods, casseroles, and soups.  Try adding to eggs for rich and fluffy scrambled eggs, souffls, and omelets.  Use as a topping for nachos or tacos.   Sugar, Honey, or Jam 2 Tbsp White or Brown Sugar  1.5 Tbsp Honey  2 Tbsp Jam Add to drinks, cereals, and desserts.   Use as a glaze for meats, such as chicken.  Enjoy on buttered toast.   Sweetened Condensed Milk 1 Tbsp Add to pies, puddings, and milkshakes.   Mix 1-2 Tbsp with peanut butter and spread on toast.   Wheat Germ 3 Tbsp Add 1 to 2 Tbsp to cereal.    Mix into meat dishes, cookie batter, and casseroles.   Whipped Cream  cup Cool Whip Top it on pies, cakes, hot chocolate, fruit, jello, and other desserts.     Answered all food and nutrition-related questions.   Ensured patient that RD will continue to be available for assistance as needed.    Recommendations/Plan:   1. Make all food and drink as nutrient and calorically-dense  as possible.  2. Consume a variety of whole foods from all food groups following USDA Dietary Guidelines.  3. Establish regular, routine times for meals and sit-down snacks.   4. Follow up as needed.    Understanding: Good  Expected Adherence: Good    MONITORING AND EVALUATION   1. Continue to monitor weight and nutrition-related lab values.  2. Evaluate lifestyle modifications.  3. Encourage continued modifications to promote healthy weight management and normalization of nutrition-related lab values.    Time Spent: 30  minutes: 100% face-to-face counseling time.    Billie Ruddy, RDLD 03/16/2022, 09:44

## 2022-03-19 DIAGNOSIS — D320 Benign neoplasm of cerebral meninges: Secondary | ICD-10-CM | POA: Insufficient documentation

## 2022-03-19 DIAGNOSIS — F39 Unspecified mood [affective] disorder: Secondary | ICD-10-CM | POA: Insufficient documentation

## 2022-03-19 DIAGNOSIS — D692 Other nonthrombocytopenic purpura: Secondary | ICD-10-CM | POA: Insufficient documentation

## 2022-03-21 ENCOUNTER — Encounter (HOSPITAL_BASED_OUTPATIENT_CLINIC_OR_DEPARTMENT_OTHER): Payer: Self-pay | Admitting: Internal Medicine

## 2022-03-21 ENCOUNTER — Ambulatory Visit (HOSPITAL_BASED_OUTPATIENT_CLINIC_OR_DEPARTMENT_OTHER): Payer: Medicare PPO | Admitting: Family

## 2022-03-21 ENCOUNTER — Other Ambulatory Visit: Payer: Self-pay

## 2022-03-21 ENCOUNTER — Ambulatory Visit: Payer: Medicare PPO | Attending: Internal Medicine | Admitting: Internal Medicine

## 2022-03-21 ENCOUNTER — Encounter (HOSPITAL_BASED_OUTPATIENT_CLINIC_OR_DEPARTMENT_OTHER): Payer: Self-pay | Admitting: Family

## 2022-03-21 VITALS — BP 124/68 | HR 90 | Temp 97.7°F | Ht 63.7 in | Wt 105.8 lb

## 2022-03-21 DIAGNOSIS — E46 Unspecified protein-calorie malnutrition: Secondary | ICD-10-CM | POA: Insufficient documentation

## 2022-03-21 DIAGNOSIS — R413 Other amnesia: Secondary | ICD-10-CM | POA: Insufficient documentation

## 2022-03-21 DIAGNOSIS — M1990 Unspecified osteoarthritis, unspecified site: Secondary | ICD-10-CM | POA: Insufficient documentation

## 2022-03-21 DIAGNOSIS — F39 Unspecified mood [affective] disorder: Secondary | ICD-10-CM | POA: Insufficient documentation

## 2022-03-21 DIAGNOSIS — H547 Unspecified visual loss: Secondary | ICD-10-CM

## 2022-03-21 DIAGNOSIS — R1310 Dysphagia, unspecified: Secondary | ICD-10-CM

## 2022-03-21 DIAGNOSIS — G43909 Migraine, unspecified, not intractable, without status migrainosus: Secondary | ICD-10-CM | POA: Insufficient documentation

## 2022-03-21 DIAGNOSIS — Z0000 Encounter for general adult medical examination without abnormal findings: Secondary | ICD-10-CM

## 2022-03-21 DIAGNOSIS — D320 Benign neoplasm of cerebral meninges: Secondary | ICD-10-CM

## 2022-03-21 DIAGNOSIS — D692 Other nonthrombocytopenic purpura: Secondary | ICD-10-CM | POA: Insufficient documentation

## 2022-03-21 DIAGNOSIS — K219 Gastro-esophageal reflux disease without esophagitis: Secondary | ICD-10-CM | POA: Insufficient documentation

## 2022-03-21 DIAGNOSIS — Z8673 Personal history of transient ischemic attack (TIA), and cerebral infarction without residual deficits: Secondary | ICD-10-CM | POA: Insufficient documentation

## 2022-03-21 MED ORDER — DULOXETINE 60 MG CAPSULE,DELAYED RELEASE
60.0000 mg | DELAYED_RELEASE_CAPSULE | Freq: Every day | ORAL | 3 refills | Status: DC
Start: 2022-03-21 — End: 2022-05-30

## 2022-03-21 MED ORDER — DICLOFENAC SODIUM 75 MG TABLET,DELAYED RELEASE
75.0000 mg | DELAYED_RELEASE_TABLET | Freq: Two times a day (BID) | ORAL | 0 refills | Status: DC
Start: 2022-03-21 — End: 2022-07-11

## 2022-03-21 MED ORDER — DEXILANT 60 MG CAPSULE, DELAYED RELEASE
60.0000 mg | DELAYED_RELEASE_CAPSULE | Freq: Every day | ORAL | 3 refills | Status: DC
Start: 2022-03-21 — End: 2022-03-23

## 2022-03-21 MED ORDER — OXYCODONE 10 MG TABLET
10.0000 mg | ORAL_TABLET | Freq: Four times a day (QID) | ORAL | 0 refills | Status: DC | PRN
Start: 2022-03-21 — End: 2022-04-30

## 2022-03-21 NOTE — Nursing Note (Signed)
03/21/22 1300   Medicare Wellness Assessment   Medicare initial or wellness physical in the last year? No   Advance Directives   Does patient have a living will or MPOA YES   Has patient provided [employer] with a copy? YES   Advance directive information given to the patient today? no   Activities of Daily Living   Do you need help with dressing, bathing, or walking? Yes  (with walking)   Do you need help with shopping, housekeeping, medications, or finances? Yes   Do you have rugs in hallways, broken steps, or poor lighting? No   Do you have grab bars in your bathroom, non-slip strips in your tub, and hand rails on your stairs? No   Urinary Incontinence Screen-Women only   Do you ever leak urine when you don't want to? YES   Cognitive Function Screen   What is you age? 1   What is the time to the nearest hour? 1   What is the year? 1   What is the name of this clinic? 1   Can the patient recognize two persons (the doctor, the nurse, home help, etc.)? 1   What is the date of your birth? (day and month sufficient)  1   In what year did World War II end? 0   Who is the current president of the [place]? 1   Count from 20 down to 1? 1   What address did I give you earlier? 0   Total Score 8   Interpretation of Total Score Greater than 6 Normal   Depression Screen   Little interest or pleasure in doing things. 0   Feeling down, depressed, or hopeless 0   PHQ 2 Total 0   Pain Score   Pain Score SIX   Substance Use Screening   In Past 12 MONTHS, how often have you used any tobacco product (for example, cigarettes, e-cigarettes, cigars, pipes, or smokeless tobacco)? Never   In the PAST 12 MONTHS, how often have you had 5 (men)/4 (women) or more drinks containing alcohol in one day? Never   In the PAST 12 months, how often have you used any prescription medications just for the feeling, more than prescribed, or that were not prescribed for you? Prescriptions may include: opioids, benzodiazepines, medications  for ADHD Never   In the PAST 12 MONTHS, how often have you used any drugs, including marijuana, cocaine or crack, heroin, methamphetamine, hallucinogens, ecstasy/MDMA? Never   Hearing Screen   Have you noticed any hearing difficulties? No   After whispering 9-1-6 how many numbers did the patient repeat correctly? 3   Fall Risk Assessment   Do you feel unsteady when standing or walking? Yes   Do you worry about falling? Yes   Have you fallen in the past year? Yes   How many times have you fallen? 2 or more times   Were you ever injured from falling? No   Vision Screen   Right Eye = 20 50   Left Eye = 20 30     Corbin Ade, RN  03/21/2022, 13:24

## 2022-03-21 NOTE — Progress Notes (Signed)
INTERNAL MEDICINE   [facility] -[facility]  Return Visit Progress Note     Name: Rachel [name] Date of Service: 03/21/2022   MRN:  [id number]  Age/DOB: 81 y.o., 1941/02/28 PCP:  [name], MD  Reason for Visit: Follow Up 6 Months     Subjective:      Rachel [name] is a 81 y.o. 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, migraines    OA  Despite taking oxycodone she still has significant pain, not using voltaren gel anymore because she hasn't noticed that it helps   Saw neurosurgery and was told no surgical intervention for cervical spine OA    Saw the nutritionist and she had given up candy and pretzels for several weeks and she started to eat and she did not notice a difference in her dizziness   she's eating 3 meals a day now but she isn't hungry at all, appetite was never an issue before  She doesn't want to take anything to help her with her appetite  She drinks ensure daily and was recommended to put berries in with it to make a milkshake  MRI 02/2022 showed 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     MEDICATIONS:  Outpatient Medications Marked as Taking for the 03/21/22 encounter (Office Visit) with [name], MD   Medication Sig   . DEXILANT 60 mg Oral Cap, Delayed Rel., Multiphasic Take 1 Capsule (60 mg total) by mouth Once a day   . diclofenac sodium (VOLTAREN) 75 mg Oral Tablet, Delayed Release (E.C.) Take 1 Tablet (75 mg total) by mouth Twice daily Indications: joint damage causing pain and loss of function   . DULoxetine (CYMBALTA DR) 60 mg Oral Capsule, Delayed Release(E.C.) Take 1 Capsule (60 mg total) by mouth Once a day   . fexofenadine HCl (ALLEGRA ORAL) Take 180 mg by mouth Every morning        Review of Systems:      Other than stated above all other ROS are negative.      Physical Exam:     Vitals:   height is 1.618 m (5' 3.7") and weight is 48 kg (105 lb 13.1 oz). Her temporal temperature is 36.5 C (97.7 F). Her blood pressure is 124/68 and her pulse is 90. Her oxygen saturation is 99%.      General:  Well-nourished, well-developed, in no apparent distress  Head:  Normocephalic, atraumatic  Eyes:  Pupils equally round, extraocular muscles intact, sclareae non-icteric, conjunctivae clear  Neck:  Trachea midline, no thyromegaly or lymphadenopathy  Cardiovascular:  Regular rate and rhythm, no murmurs, rubs, gallops; no edema in lower extremities BL  Respiratory:  Clear to auscultation bilaterally, no wheezes, rhonchi, crackles  Abdominal:  Bowel sounds normal; abdomen soft, non-tender to palpation  Skin:  Warm and dry  Neurological:  Awake, A&O x 3. CNII-XII, strength and sensation grossly intact  Psychiatric:  Normal mood & affect, thought process linear, speech content normal       Data Reviewed and Interpretation:     HEMOGLOBIN A1C  Lab Results   Component Value Date    HA1C 6.1 (H) 09/13/2021     COMPLETE BLOOD  COUNT   Lab Results   Component Value Date    WBC 4.8 12/27/2021    HGB 11.0 (L) 12/27/2021    HCT 35.9 12/27/2021    PLTCNT 202 12/27/2021       DIFFERENTIAL  Lab Results   Component Value Date    PMNS 60 12/27/2021    MONOCYTES 11 12/27/2021    BASOPHILS 1 12/27/2021    BASOPHILS <0.10 12/27/2021    PMNABS 2.89 12/27/2021    LYMPHSABS 1.04 12/27/2021    EOSABS 0.30 12/27/2021    MONOSABS 0.52 12/27/2021     BASIC METABOLIC PANEL  Lab Results   Component Value Date    SODIUM 140 12/27/2021    POTASSIUM 4.6 12/27/2021    CHLORIDE 103 12/27/2021    CO2 28 12/27/2021    ANIONGAP 9 12/27/2021    BUN 16 12/27/2021    CREATININE 0.78 12/27/2021    BUNCRRATIO 21 12/27/2021    GFR 77 12/27/2021    CALCIUM 8.9 12/27/2021    GLUCOSENF 90 12/27/2021      COMPREHENSIVE METABOLIC PANEL FASTING  Lab  Results   Component Value Date    SODIUM 140 12/27/2021    POTASSIUM 4.6 12/27/2021    CHLORIDE 103 12/27/2021    CO2 28 12/27/2021    ANIONGAP 9 12/27/2021    BUN 16 12/27/2021    CREATININE 0.78 12/27/2021    CALCIUM 8.9 12/27/2021    ALBUMIN 3.9 12/27/2021    TOTALPROTEIN 7.0 12/27/2021    ALKPHOS 67 12/27/2021    AST 24 12/27/2021    ALT 15 12/27/2021     COMPREHENSIVE METABOLIC PANEL - NON FASTING  Lab Results   Component Value Date    SODIUM 140 12/27/2021    POTASSIUM 4.6 12/27/2021    CHLORIDE 103 12/27/2021    CO2 28 12/27/2021    ANIONGAP 9 12/27/2021    BUN 16 12/27/2021    CREATININE 0.78 12/27/2021    GLUCOSENF 90 12/27/2021    CALCIUM 8.9 12/27/2021    ALBUMIN 3.9 12/27/2021    TOTALPROTEIN 7.0 12/27/2021    ALKPHOS 67 12/27/2021    AST 24 12/27/2021    ALT 15 12/27/2021     LIPID PROFILE  Lab Results   Component Value Date    CHOLESTEROL 230 (H) 09/13/2021    HDLCHOL 72 09/13/2021    LDLCHOL 125 (H) 09/13/2021    TRIG 167 (H) 09/13/2021     VITAMIN B12  Lab Results   Component Value Date    VITB12 324 02/06/2022     VITAMIN D 25-HYDROXY  Lab Results   Component Value Date    VITD25 58 09/13/2021            Assessment:      Rachel [name] is a 81 y.o. 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, migraines         Plan:     (M19.90) Osteoarthritis, unspecified osteoarthritis type, unspecified site  (primary encounter diagnosis)  Plan: Refer to Physical Therapy-External, Refer to         [facility] Therapy at [facility],         DULoxetine (CYMBALTA DR) 60 mg Oral Capsule,         Delayed Release(E.C.), Oxycodone (ROXICODONE)         10 mg Oral Tablet    Add PO diclofenac, refilled oxycodone  PT referral    (K21.9) Gastroesophageal reflux disease, unspecified whether esophagitis  present  Plan: DEXILANT 60 mg Oral Cap, Delayed Rel.,         Multiphasic    (Z86.73) History of lacunar cerebrovascular accident (CVA)  Reviewed MRI brain with patient  Suspect this is the etiology  of her dizziness periodically  Patient worried about taking ASA because of frequently bumping up against things and occasional falls  Statin worsened her joint pain so she declined         Healthcare Maintenance:     Not discussed at today's visit    Follow Up:       Return to clinic in 3 months      Patient had no questions regarding treatment plan, goals, risks or benefits and agrees to contact me or my clinic in the interim should any questions or problems arise.    [name], MD

## 2022-03-23 ENCOUNTER — Ambulatory Visit (HOSPITAL_BASED_OUTPATIENT_CLINIC_OR_DEPARTMENT_OTHER): Payer: Self-pay | Admitting: Internal Medicine

## 2022-03-23 ENCOUNTER — Other Ambulatory Visit (HOSPITAL_BASED_OUTPATIENT_CLINIC_OR_DEPARTMENT_OTHER): Payer: Self-pay | Admitting: Internal Medicine

## 2022-03-23 DIAGNOSIS — K219 Gastro-esophageal reflux disease without esophagitis: Secondary | ICD-10-CM

## 2022-03-23 MED ORDER — ESOMEPRAZOLE MAGNESIUM 40 MG CAPSULE,DELAYED RELEASE
40.0000 mg | DELAYED_RELEASE_CAPSULE | Freq: Every morning | ORAL | 3 refills | Status: DC
Start: 2022-03-23 — End: 2022-10-15

## 2022-03-23 NOTE — Telephone Encounter (Signed)
Received fax from [facility] stating [place] DR 60 MG is not covered. They are requesting an alternative. Please advise.  Denna Haggard, [place]  03/23/2022, 08:31

## 2022-04-03 ENCOUNTER — Other Ambulatory Visit: Payer: Self-pay

## 2022-04-03 ENCOUNTER — Ambulatory Visit (INDEPENDENT_AMBULATORY_CARE_PROVIDER_SITE_OTHER): Payer: Medicare PPO

## 2022-04-03 DIAGNOSIS — M542 Cervicalgia: Secondary | ICD-10-CM

## 2022-04-03 DIAGNOSIS — M6281 Muscle weakness (generalized): Secondary | ICD-10-CM

## 2022-04-03 DIAGNOSIS — M47892 Other spondylosis, cervical region: Secondary | ICD-10-CM

## 2022-04-03 DIAGNOSIS — R2681 Unsteadiness on feet: Secondary | ICD-10-CM

## 2022-04-03 NOTE — Progress Notes (Signed)
Initial Evaluation: Patient Name: Rachel [name]  C/C - Cervical pain and mobility deficits >> fall risk     Subjective CC: "I got my neck fused in October C5-C7, 2022.  Until just recently it really hurt.  I had a lot of trouble moving my neck from left to right.. Lately I am doing really well including with my pain. I fall a lot, My most recent fall was last month"    HPI/MOI: Initial onset associated with cervical fusion she states performed in [place].    Pervious Tx: active rest, gentle movement per tolerance.    PMHx: see med record  Medications: See med record.    Symptom Description: "sometimes, hurts, soreness."    Feels Better When: "Adjusting my pillows 1,2. CBD oil."    Feels Worse When: "When I turn to the left and the Right. Lying flat I am unable bc of the neck."    Reports in regards to intensity of cervical pain -  Pain Scale: Average 6/10 depending on mvm/activity   Best within last 48hrs 0/10  Worst within last 48hrs: 8/10  ______________________________________    Prior LOF: unremarkable     Current LOF: "I have a real problem with knitting and needling pointing, golf, walking my dog bc of my neck. Bc of my neck I do not do anything. It really had shut me down. I try and be really careful when I reach. I want to get back to cooking." - NDI ~30%."    Patient Stated Goals: "Just get back to being me and active."  ______________________________________    Test and Measures:  Observation:  Tendency toward chin kick  / not keeping neutral head position at times during communication / static posturing.  Limited to no cervical lordosis.    Palpation: Generalized discomfort throughout B upper traps / levator regions. No pronounced difference in tone on B comparison.    ROM:  Left rotation 15  Left SB 8  Right rotation 45  Right SB 15     No issues with B shoulder mobility - WFL, no sig difference B    Muscle Strength:   Cervical strength able to move against gravity in all planes with ability to  hold 3+ to 4/5.    Other: (+) CCFT (+) Fall risk hx of recent, multiple falls  ________________________________________    Goals:  In 8 weeks, will:    1. Be independent with HEP focused on restoring functional cervical mobility and reducing fall risk.  2. Report no sig functional related deficits in associated with neck - NDI at or less than 20%  3. Report no limitation in desired daily tasks in respects to neck  4. Report of resuming an active lifestyle within safety means  5. Understand need of improving fall risk.  6. Begin fall risk related training.  7. Report of being less fearful of mvm  ________________________________________    Manual Therapy: none this date, emphasis on HEP.    NMR: Performed for improved coordination and development of normal mvm patterning, postural sense, proprioception - to promote normal functional mobility and stability tolerance with dynamic mvms.   None this date    Therapeutic Exercise:  Avoid over active upper trap with all / compensatory mvm    Chin tucks - 10 x 2  Left rotation - 10 x 2  Scapular retraction - 10 x 2    Education:  Educated in regards to anticipated PT POC, PT evaluation findings in respects to  chief complaints, avoidance of sharp, stab or jab type pain, DOMS, adherence, goals, pain management, fall risk, HEP completion.  See ther ex/nmr section this date for HEP.  Encouraged weening into reps per tolerance.  Voiced comprehension and stated no questions.  ________________________________________    Assessment: Rachel [name] ambulated in to OP PT c/o generalized neck pain and mobility deficits.  She voiced onset to be linked to a cervical fusion and c/c to be improving by the day.  She states limitation in all desired household tasks as a result of knowledge deficits and fear of mvm post sx and hx of falls.   Referring dx of cervical OA.  See recent MRI hx for further details. On PT eval - NO red flag S&S noted.  No radiating features.  Full B shoulder  mobility.  Loss of cervical spinal curvature.  Tendency toward rigid neck movements.  Restricted functional motion most noted to the left with limitation in finding and maintaining neutral upper cervical positioning.  Global deconditioning from increase in being sedentary. NDI score 30% See remainder of PT eval for further details.  In my professional opinion, Rachel [name] is appropriate for skilled OP PT intervention working to achieve less tolerance to desired life roles and participation in active lifestyle, in association with cervical region and fall risk. *Note: minimal eval time and tx per patient arriving greater than 30 min late for apt.   ________________________________________    Plan: To initiate OP PT POC w/the following: manual therapy, ther ex, ther act, NMR, patient education, HEP, and modality application prn -  focused on the achievement of a sustained pain modulated state and less restricted cervical mobility via the promotion of improved AROM, scapular stability postural control. Address full risk.    Planned Frequency:   1 x per week x 8 weeks to make a reduction in frequency of OP PT sessions as appropriate based on functional, work, and recreational related gains, S&S report, and patient independence with HEP including long term management.  ________________________________________    Rehab Potential: In my professional opinion, prognosis is fair based on age, initial PT evaluation, HPI/MOI, lifestyle, motor planning tendencies at start of care, support system, current and prior LOF, PMHx, motivational level, and knowledge base.  ________________________________________    Gaynelle Arabian, DPT    [place] [phone]  ________________________________________    Charges: 21 min    PT eval Low Complexity (97161) - 11 min  NMR (21194) - 0 / 0 unit  Manual Therapy (97140) - 0 min / 0 unit  Therapeutic Exercise (97110) - 10 min / 1 unit  *includes applicable patient education time    Please note:  Please  note:PT EvaluationLowComplexity. 2or lessexam elements; no sig personalcomplicatingfactorssuch as comorbidities; conditionstable; low complexityclinical decision making expected.

## 2022-04-11 ENCOUNTER — Other Ambulatory Visit (HOSPITAL_BASED_OUTPATIENT_CLINIC_OR_DEPARTMENT_OTHER): Payer: Self-pay | Admitting: Internal Medicine

## 2022-04-11 ENCOUNTER — Encounter (HOSPITAL_BASED_OUTPATIENT_CLINIC_OR_DEPARTMENT_OTHER): Payer: Self-pay | Admitting: Internal Medicine

## 2022-04-11 ENCOUNTER — Ambulatory Visit (HOSPITAL_BASED_OUTPATIENT_CLINIC_OR_DEPARTMENT_OTHER): Payer: Self-pay | Admitting: Internal Medicine

## 2022-04-11 NOTE — Telephone Encounter (Signed)
Regarding: rx refill request  ----- Message from Lynann Beaver Force sent at 04/11/2022 10:42 AM EDT -----  Doctor Name:  [name], MD      Date of last appointment: 03/21/22    Next scheduled visit: 03/22/23    Medication Requested:     rOPINIRole (REQUIP) 4 mg Oral Tablet (Expired) 180 Tablet 0 12/21/2021 01/20/2022  Sig: Take 1 Tablet (4 mg total) by mouth Twice daily for 30 days      Butalbital-Acetaminophen-Caff 50-300-40 mg Oral Capsule (Discontinued) 8 Capsule 0 01/09/2022 03/21/2022  Sig: Take 1 Capsule by mouth Once per day as needed Indications: a migraine headache          Preferred [place], [place]    [place] [address]    Phone: [phone] Fax: [phone]    Hours: Not open 24 hours          Notes for Nurse or Physician: pt migraines are coming back

## 2022-04-12 ENCOUNTER — Encounter (INDEPENDENT_AMBULATORY_CARE_PROVIDER_SITE_OTHER): Payer: Medicare PPO

## 2022-04-12 ENCOUNTER — Ambulatory Visit (INDEPENDENT_AMBULATORY_CARE_PROVIDER_SITE_OTHER): Payer: Medicare PPO

## 2022-04-12 ENCOUNTER — Other Ambulatory Visit: Payer: Self-pay

## 2022-04-12 DIAGNOSIS — M47892 Other spondylosis, cervical region: Secondary | ICD-10-CM

## 2022-04-12 DIAGNOSIS — R2681 Unsteadiness on feet: Secondary | ICD-10-CM

## 2022-04-12 DIAGNOSIS — M6281 Muscle weakness (generalized): Secondary | ICD-10-CM

## 2022-04-12 DIAGNOSIS — M542 Cervicalgia: Secondary | ICD-10-CM

## 2022-04-12 NOTE — Progress Notes (Signed)
Visit note: Patient Name: Rachel [name]  C/C - Cervical pain and mobility deficits >> fall risk     Subjective CC: "I did my homework. I am better!"  ________________________________________    Assessment: [name] in to OP PT she was unsteady in her gait and voiced having immense difficulty seeing.  She reports coming directly from eye Dr. With eyes dilated.  She was agreeable to PT in a dim lit room.  Close CGA with all ambulation. She moved neck sig more freely then at initial apt.  Strong reduction in guarding and maintain a stiff neck appearance. Tactile guidance frequent to keep motions isolated esp in to SB. Unable to perform pure side bending. Began gentle mvms against gravity - unable to rotate against or SB.  No c/o pain provocation throughout outside of soreness.  "you know I am in a lot better shape, then I thought."  No longer was turning body to communicate rather was moving neck this date. To cont w/current OP PT POC per tolerance and S&S report.   ________________________________________    Plan: To cont w/current OP PT POC -  focused on the achievement of a sustained pain modulated state and less restricted cervical mobility via the promotion of improved AROM, scapular stability postural control. Address full risk.  ________________________________________    Manual Therapy: none this date.    NMR: Performed for improved coordination and development of normal mvm patterning, postural sense, proprioception - to promote normal functional mobility and stability tolerance with dynamic mvms.     SA punch - 10 x 2  Band pulls - 10 x 2  Chin tuck and lift - 10 x 2    Therapeutic Exercise:  Avoid over active upper trap with all / compensatory mvm    Chin tucks - 10 x 2  Left rotation - 10 x 2  Right rotation  - 10 x 2  Cervical SB B - 10 x 2e  Scapular squeezes in sitting - 10 x 2  Gentle cervical SB isometric in neutral - 10 x 2  Gentle cervical extension in neutral - 10 x 2  AAROM chest press to  overhead - 10 x 2      Education:  Educated in regards to anticipated PT POC, PT evaluation findings in respects to chief complaints, avoidance of sharp, stab or jab type pain, DOMS, adherence, goals, pain management, fall risk, HEP completion.  See ther ex/nmr section this date for HEP.  Encouraged weening into reps per tolerance.  Voiced comprehension and stated no questions.  ________________________________________    Gaynelle Arabian, DPT    [place] [phone]  ________________________________________    Charges: 42 min    NMR (64403) - 12 / 1 unit  Manual Therapy (97140) - 0 min / 0 unit  Therapeutic Exercise (97110) - 30 min / 2 unit  *includes applicable patient education time

## 2022-04-14 ENCOUNTER — Other Ambulatory Visit (HOSPITAL_BASED_OUTPATIENT_CLINIC_OR_DEPARTMENT_OTHER): Payer: Self-pay | Admitting: Internal Medicine

## 2022-04-14 DIAGNOSIS — K219 Gastro-esophageal reflux disease without esophagitis: Secondary | ICD-10-CM

## 2022-04-16 ENCOUNTER — Ambulatory Visit (HOSPITAL_BASED_OUTPATIENT_CLINIC_OR_DEPARTMENT_OTHER): Payer: Self-pay | Admitting: Internal Medicine

## 2022-04-16 ENCOUNTER — Other Ambulatory Visit (HOSPITAL_BASED_OUTPATIENT_CLINIC_OR_DEPARTMENT_OTHER): Payer: Self-pay | Admitting: Internal Medicine

## 2022-04-16 DIAGNOSIS — G2581 Restless legs syndrome: Secondary | ICD-10-CM

## 2022-04-16 MED ORDER — ROPINIROLE 4 MG TABLET
4.0000 mg | ORAL_TABLET | Freq: Three times a day (TID) | ORAL | 1 refills | Status: DC
Start: 2022-04-16 — End: 2022-07-11

## 2022-04-16 NOTE — Telephone Encounter (Signed)
Regarding: rx refill request - 2nd request  ----- Message from Lynann Beaver Force sent at 04/16/2022  9:50 AM EDT -----  Doctor Name:  [name], MD       Date of last appointment: 03/21/22    Next scheduled visit: 04/18/22    Medication Requested:     rOPINIRole (REQUIP) 4 mg Oral Tablet (Expired)    180 Tablet       0          12/21/2021      01/20/2022  Sig: Take 1 Tablet (4 mg total) by mouth Twice daily for 30 days          Preferred [place], [place]    [place] [address]    Phone: [phone] Fax: [phone]    Hours: Not open 24 hours         Notes for Nurse or Physician: Pt is taking for her restless legs. Pt is taking that medication regularly. Pt also wanted you to know that she only has 1 PT appt left.

## 2022-04-18 ENCOUNTER — Ambulatory Visit (HOSPITAL_BASED_OUTPATIENT_CLINIC_OR_DEPARTMENT_OTHER): Payer: Self-pay | Admitting: Internal Medicine

## 2022-04-26 ENCOUNTER — Ambulatory Visit (INDEPENDENT_AMBULATORY_CARE_PROVIDER_SITE_OTHER): Payer: Medicare PPO

## 2022-04-26 ENCOUNTER — Other Ambulatory Visit: Payer: Self-pay

## 2022-04-26 DIAGNOSIS — M542 Cervicalgia: Secondary | ICD-10-CM

## 2022-04-26 DIAGNOSIS — R2681 Unsteadiness on feet: Secondary | ICD-10-CM

## 2022-04-26 DIAGNOSIS — M6281 Muscle weakness (generalized): Secondary | ICD-10-CM

## 2022-04-26 DIAGNOSIS — M47892 Other spondylosis, cervical region: Secondary | ICD-10-CM

## 2022-04-26 NOTE — Progress Notes (Signed)
Discharge note: Patient Name: Rachel [name]  C/C - Cervical pain and mobility deficits >> fall risk     Subjective CC: "I am okay. This is it for me today.  I have been doing my exercises at home."  ________________________________________    Assessment: Rachel [name] in to OP PT after an approx 2 week lapse in care reporting cont adherence with home program.  She states improved strength of neck musculature to have improved her ability to swollow. She notes consistently performing her HEP including while riding in car with husband driving.  She is incorporating TA in to each motion. She is now turning her head to communicate, she reports no longer guarding at her neck feeling comfortable to move it. Neck motions fluid.  Cervical AROM WFL and symmetrical B.  She reports "I can tell huge difference. I am not having any trouble looking and it doesn't hurt including after I do the motion."  All long term PT goals achieved with the exception of fall risk reduction.  She states preference to finish care this date per all desired outcomes achieved in relation to neck.  Educated on cont to be cautious with her WB functional mobility. Educated her she can return to care on another episode of care in future for fall risk training if she is open to it.  To discharge from OP PT services at this time.   ________________________________________    Plan: To discharge from OP PT services at this time, indp with cervical HEP.  NOT indp with fall risk training.  ________________________________________    Goals:  In 8 weeks, will:    1. Be independent with HEP focused on restoring functional cervical mobility and reducing fall risk - partially achieved, fall risk remains present.  2. Report no sig functional related deficits in associated with neck - NDI at or less than 20% - achieved  3. Report no limitation in desired daily tasks in respects to neck  - achieved  4. Report of resuming an active lifestyle within safety means -  achieved  5. Understand need of improving fall risk - achieved  6. Begin fall risk related training - not achieved, pt reports planning to come back for that in the future  7. Report of being less fearful of mvm - achieved at neck  ________________________________________    Manual Therapy: none this date.    NMR: Performed for improved coordination and development of normal mvm patterning, postural sense, proprioception - to promote normal functional mobility and stability tolerance with dynamic mvms.     SA punch - 10 x 2  Band pulls - 10 x 2  Chin tuck and lift - 10 x 1    Therapeutic Exercise:  Avoid over active upper trap with all / compensatory mvm    Chin tucks - 10 x 1  Left rotation - 10 x 1  Right rotation  - 10 x 1  Cervical SB B - 10 x 1  Scapular squeezes in sitting - 10 x 2  Gentle cervical SB isometric in neutral - 10 x 2  Gentle cervical extension in neutral - 10 x 2  AAROM chest press to overhead - 10 x 2    Education:  Educated in regards to anticipated PT POC, PT evaluation findings in respects to chief complaints, avoidance of sharp, stab or jab type pain, DOMS, adherence, goals, pain management, fall risk, HEP completion.  See ther ex/nmr section this date for HEP.  Encouraged weening into  reps per tolerance.  Voiced comprehension and stated no questions.  ________________________________________    Gaynelle Arabian, DPT    [place] [phone]  ________________________________________    Charges: 40 min    NMR (00349) - 10 / 1 unit  Manual Therapy (97140) - 0 min / 0 unit  Therapeutic Exercise (97110) - 30 min / 2 unit  *includes applicable patient education time

## 2022-04-30 ENCOUNTER — Other Ambulatory Visit (HOSPITAL_BASED_OUTPATIENT_CLINIC_OR_DEPARTMENT_OTHER): Payer: Self-pay | Admitting: Internal Medicine

## 2022-04-30 DIAGNOSIS — M1990 Unspecified osteoarthritis, unspecified site: Secondary | ICD-10-CM

## 2022-04-30 MED ORDER — OXYCODONE 10 MG TABLET
10.0000 mg | ORAL_TABLET | Freq: Four times a day (QID) | ORAL | 0 refills | Status: DC | PRN
Start: 2022-04-30 — End: 2022-06-11

## 2022-04-30 NOTE — Telephone Encounter (Signed)
Regarding: refill request  ----- Message from Lenice Llamas sent at 04/30/2022 11:40 AM EDT -----  Doctor Name:  [name], MD      Date of last appointment: 03/21/22    Next scheduled visit: 05/04/22    Medication Requested:     Oxycodone (ROXICODONE) 10 mg Oral Tablet    Preferred [place], [place]    [place] [address]    Phone: [phone] Fax: [phone]    Hours: Not open 24 hours             Notes for Nurse or Physician:

## 2022-05-04 ENCOUNTER — Other Ambulatory Visit: Payer: Self-pay

## 2022-05-04 ENCOUNTER — Encounter (HOSPITAL_BASED_OUTPATIENT_CLINIC_OR_DEPARTMENT_OTHER): Payer: Self-pay | Admitting: Internal Medicine

## 2022-05-04 ENCOUNTER — Ambulatory Visit: Payer: Medicare PPO | Attending: Internal Medicine | Admitting: Internal Medicine

## 2022-05-04 VITALS — BP 116/62 | HR 103 | Temp 98.2°F | Wt 101.0 lb

## 2022-05-04 DIAGNOSIS — J3489 Other specified disorders of nose and nasal sinuses: Secondary | ICD-10-CM | POA: Insufficient documentation

## 2022-05-04 DIAGNOSIS — M542 Cervicalgia: Secondary | ICD-10-CM | POA: Insufficient documentation

## 2022-05-04 DIAGNOSIS — M1990 Unspecified osteoarthritis, unspecified site: Secondary | ICD-10-CM | POA: Insufficient documentation

## 2022-05-04 DIAGNOSIS — G8929 Other chronic pain: Secondary | ICD-10-CM | POA: Insufficient documentation

## 2022-05-04 DIAGNOSIS — M545 Low back pain, unspecified: Secondary | ICD-10-CM | POA: Insufficient documentation

## 2022-05-04 MED ORDER — FLUTICASONE PROPIONATE 50 MCG/ACTUATION NASAL SPRAY,SUSPENSION
1.0000 | Freq: Two times a day (BID) | NASAL | 0 refills | Status: DC
Start: 2022-05-04 — End: 2022-08-13
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
## 2022-05-14 ENCOUNTER — Encounter (INDEPENDENT_AMBULATORY_CARE_PROVIDER_SITE_OTHER): Payer: Self-pay

## 2022-05-14 ENCOUNTER — Ambulatory Visit (INDEPENDENT_AMBULATORY_CARE_PROVIDER_SITE_OTHER): Payer: Self-pay | Admitting: Ophthalmology

## 2022-05-25 ENCOUNTER — Ambulatory Visit (HOSPITAL_BASED_OUTPATIENT_CLINIC_OR_DEPARTMENT_OTHER): Payer: Self-pay | Admitting: Internal Medicine

## 2022-05-25 NOTE — Telephone Encounter (Signed)
Regarding: Rx  ----- Message from Myrtie Neither sent at 05/25/2022 12:40 PM EDT -----  [name], MD    The pt wants to speak with someone about the Duloxetine because she wants to take 30 mg (not 60 mg) and she wants a new Rx sent to the pharmacy.    Preferred Pharmacy     [facility] [place], [place]    [place] [address]    Phone: [phone] Fax: [phone]    Hours: Not open 24 hours

## 2022-05-25 NOTE — Telephone Encounter (Signed)
Rachel Guadeloupe, MD  Dennis Bast; [name], MD 1 hour ago (2:09 PM)       It looks like at the march visit it was increased to 60 mg but if she has been only taking 30 it can go back down to the 30 mg. Its up to the patient. I advise her to take in the evening as it may make her sleepy and see if that makes a difference.         Sent patient a Therapist, music. [name], RN

## 2022-05-25 NOTE — Telephone Encounter (Signed)
Called the patient, Rachel [name], regarding her triage message. Patient stated that she has always been on the 30 mg of Cymbalta, and she didn't realize that she is now currently on the 60 mg of Cymbalta. Patient stated that she is currently taking it in the morning. Patient wanted to know why there was an increase in the medicine, but if that's what Dr. Megan Salon thinks is right, then she will continue to take it.     Patient stated that she has been very tired lately, and sleeping a lot. She is thinking that is maybe it is from her Cymbalta. She wants to know what time of day Dr. Megan Salon thinks is best for her to take her Cymbalta. Patient stated that she just wants to make sure that she is taking she correct medication. Informed patient that I would get this information over to the provider. Please advise.  [name], RN

## 2022-05-28 ENCOUNTER — Telehealth (HOSPITAL_BASED_OUTPATIENT_CLINIC_OR_DEPARTMENT_OTHER): Payer: Self-pay | Admitting: Internal Medicine

## 2022-05-28 NOTE — Telephone Encounter (Signed)
Patient called back into the call center to return our phone call. Patient confirmed name and DOB. Patient stated that she is currently taking the 60 mg of Cymbalta and started taking it in the evening two nights ago and it has made her "sleep like a baby".     Patient stated she is completely out of pills because her husband told her that they "are for crazy people and she shouldn't be taking them", so she dumped them all out. Patient stated that she has to be strong and listen to what the doctor is telling her and wants to continue taking her medication, but once again is out of them. Please advise.    Denna Haggard, [place]

## 2022-05-28 NOTE — Progress Notes (Unsigned)
INTERNAL MEDICINE   Medical Group Practice at [facility]  Acute Visit Progress Note     Name: [name] Date of Service: 05/30/2022   MRN:  [id number]  Age/DOB: 81 y.o., [birth year] PCP:  [name], MD  Reason for Visit: No chief complaint on file.***     Subjective:      ORETHA WEISMANN is a 81 y.o. 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 She felt like she was sleepy during the day so she recently started taking it at night in which she would sleep really well. She is completely out of pills because her husband told her that they "are for crazy people and she shouldn't be taking them", so she dumped them all out.     Medical History:      Past Medical History, Allergies, Surgical History, Family, and Social History were reviewed  on   05/28/2022 and updated as needed.     MEDICATIONS:  No outpatient medications have been marked as taking for the 05/30/22 encounter (Appointment) with Loura Back, PA-C.       Physical Exam:     Vitals:   vitals were not taken for this visit.      General:  Well-nourished, well-developed, in no apparent distress  Head:  Normocephalic, atraumatic  Ears:  Tympanic membranes pearly without erythema or edema bilaterally  Eyes:  Pupils equally round and reactive to light, extraocular muscles intact, sclareae non-icteric, conjunctivae clear  Nose:  Turbinates without inflammation or erythema  Throat:  Oropharynx clear, mucous membranes moist  Neck:  Trachea midline, no thyromegaly or lymphadenopathy  Cardiovascular:  Regular rate and rhythm, no murmurs, rubs, gallops  Respiratory:  Clear to auscultation bilaterally, no wheezes, rhonchi, crackles  Abdominal:  Bowel sounds normal; abdomen soft, non-tender to palpation  Extremities:  No edema, dorsalis pedis pulses 2+ bilaterally  Skin:  Warm and dry  Neurological:  Awake, A&O x 3. CNII-XII, strength and sensation grossly intact  Psychiatric:   Normal mood & affect, thought process linear, speech content normal       Data Reviewed and Interpretation:     {OTHER MDM DATA [id number]}         Assessment:      JONIYA BOBERG is a 81 y.o. female with PMH of ***, who presents to the clinic ***.          Plan:     ***  ***  ***    ***  ***  ***    ***  ***  ***       Follow Up:       Return to clinic in ***  Labs and Imaging Needing Follow Up:  ***  Goals for Next Visit:  ***    Patient had no questions regarding treatment plan, goals, risks or benefits and agrees to contact me or my clinic in the interim should any questions or problems arise.    Loura Back, PA-C  Internal Medicine, PGY-***

## 2022-05-28 NOTE — Telephone Encounter (Signed)
[name], MD  [name], RN; [name], MD 3 days ago       It looks like at the march visit it was increased to 60 mg but if she has been only taking 30 it can go back down to the 30 mg. Its up to the patient. I advise her to take in the evening as it may make her sleepy and see if that makes a difference.           Tried to call the patient, Rachel [name], regarding Dr. Lacinda Axon' message. Received no answer, left a message requesting a call back to [phone].  Corbin Ade, RN

## 2022-05-28 NOTE — Telephone Encounter (Signed)
Called patient to get her scheduled. Patient confirmed name and DOB. Patient is agreeable to an appointment with Loura Back on Wednesday 05/30/2022 at 9:00am. Patient is scheduled.   Denna Haggard, [place]

## 2022-05-28 NOTE — Telephone Encounter (Signed)
Received fax from [employer] and [employer] .  Plan of care needs signed and returned .  Placing in [place] for review/signature.   Rosario Jacks, MA

## 2022-05-30 ENCOUNTER — Other Ambulatory Visit: Payer: Self-pay

## 2022-05-30 ENCOUNTER — Ambulatory Visit: Payer: Medicare PPO | Attending: PHYSICIAN ASSISTANT | Admitting: PHYSICIAN ASSISTANT

## 2022-05-30 ENCOUNTER — Encounter (HOSPITAL_BASED_OUTPATIENT_CLINIC_OR_DEPARTMENT_OTHER): Payer: Self-pay | Admitting: PHYSICIAN ASSISTANT

## 2022-05-30 VITALS — BP 132/80 | HR 81 | Temp 96.8°F | Ht 63.0 in | Wt 103.2 lb

## 2022-05-30 DIAGNOSIS — R519 Headache, unspecified: Secondary | ICD-10-CM | POA: Insufficient documentation

## 2022-05-30 DIAGNOSIS — F39 Unspecified mood [affective] disorder: Secondary | ICD-10-CM | POA: Insufficient documentation

## 2022-05-30 DIAGNOSIS — Z5189 Encounter for other specified aftercare: Secondary | ICD-10-CM | POA: Insufficient documentation

## 2022-05-30 MED ORDER — DULOXETINE 30 MG CAPSULE,DELAYED RELEASE
30.0000 mg | DELAYED_RELEASE_CAPSULE | Freq: Every day | ORAL | 3 refills | Status: DC
Start: 2022-05-30 — End: 2022-07-11

## 2022-05-30 MED ORDER — TOPIRAMATE 25 MG TABLET
ORAL_TABLET | ORAL | 0 refills | Status: DC
Start: 2022-05-30 — End: 2022-07-11

## 2022-05-30 NOTE — Patient Instructions (Addendum)
Decrease Cymbalta to 30 mg nightly  Topamax: Take one tablet (25 mg) nightly. If tolerating after 1 week, you can increase to 2 tablets (50 mg nightly)    You can try dosing Requip twice daily (breakfast and dinner) to see if that helps. You can always try a half a tablet in the afternoon

## 2022-06-04 NOTE — Telephone Encounter (Signed)
Faxed back completed/signed PT plan of care to [facility]. Confirmation received. Placing to be scanned.  Starlit Raburn, RN  06/04/2022, 11:24

## 2022-06-11 ENCOUNTER — Other Ambulatory Visit (HOSPITAL_BASED_OUTPATIENT_CLINIC_OR_DEPARTMENT_OTHER): Payer: Self-pay | Admitting: Internal Medicine

## 2022-06-11 DIAGNOSIS — M1990 Unspecified osteoarthritis, unspecified site: Secondary | ICD-10-CM

## 2022-06-11 MED ORDER — OXYCODONE 10 MG TABLET
10.0000 mg | ORAL_TABLET | Freq: Four times a day (QID) | ORAL | 0 refills | Status: DC | PRN
Start: 2022-06-11 — End: 2022-06-12

## 2022-06-11 NOTE — Telephone Encounter (Signed)
Regarding: rx refill  ----- Message from Mearl Latin sent at 06/11/2022  1:31 PM EDT -----  Doctor Name:  [name], MD       Date of last appointment: 05-30-22    Next scheduled visit: 03-21-22    Medication Requested:     Oxycodone (ROXICODONE) 10 mg Oral Tablet          Preferred [place]    [place] [address]    Phone: [phone] Fax: [phone]    Hours: Not open 24 hours

## 2022-06-12 ENCOUNTER — Other Ambulatory Visit (HOSPITAL_BASED_OUTPATIENT_CLINIC_OR_DEPARTMENT_OTHER): Payer: Self-pay | Admitting: Internal Medicine

## 2022-06-12 DIAGNOSIS — M1990 Unspecified osteoarthritis, unspecified site: Secondary | ICD-10-CM

## 2022-06-12 MED ORDER — OXYCODONE 10 MG TABLET
10.0000 mg | ORAL_TABLET | Freq: Four times a day (QID) | ORAL | 0 refills | Status: DC | PRN
Start: 2022-06-12 — End: 2022-07-18

## 2022-06-12 NOTE — Telephone Encounter (Signed)
Regarding: rx out of stock  ----- Message from Lenice Llamas sent at 06/12/2022  4:19 PM EDT -----  [name], MD    Pt calling about the following rx. She states [facility] does not have any in stock. [facility] does and states if the rx is sent over then it can be filled. Pt is out of medication. Please call pt to advise. Thank you!    Oxycodone (ROXICODONE) 10 mg Oral Tablet    [facility] [place], [address] AT [place]   Phone:  [phone]  Fax:  [phone]

## 2022-06-18 ENCOUNTER — Ambulatory Visit (INDEPENDENT_AMBULATORY_CARE_PROVIDER_SITE_OTHER): Payer: Self-pay | Admitting: Vascular & Interventional Radiology

## 2022-07-09 NOTE — Progress Notes (Unsigned)
INTERNAL MEDICINE   Medical Group Practice at [facility]  Acute Visit Progress Note     Name: Rachel [name] Date of Service: 07/11/2022   MRN:  [id number]  Age/DOB: 81 y.o., 1941-08-31 PCP:  [name], MD  Reason for Visit: No chief complaint on file.     Subjective:      Rachel [name] is a 81 y.o. 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      I last saw this patient on 05/30/22 to discuss medications. She thought she was on too many sedating meds. We did decrease ehr Cymbalta to 30 mg as she did better at that dose. Advised to try only taking requip bid or just a half a tablet in the afternoon. Advised to continue to wean off of oxycodone and take only 3 times daily. She was also having issues with headaches so we did restart her topamax.   Patient presents today to discuss her medications. She state feels knocked out all the time and just wants to go back to bed. She states her husband is trying to direct her medicines and told her to throw out her Cymbalta as it is "for crazy people". She threw out all but 4 pills so she is now out. She was on Cymbalta 30 mg and was increased to 60 mg. She takes it first thing in the am and would go back to bed at 10 am and not get anything done during the day. She felt better when on the 30 mg. She has tried taking the 60 mg nightly the last 4 nights in which she is taking it at 9 pm and wakes up a 6 am and feels better. She states that she was at one point alternating her 60 mg and 30 mg pills as she had both and was confused.       She states she has not been taking her Topamax. She states she started on a low dose for a week and the next thing she knew was on 100 mg and feels that is too high. She gets headaches daily and can wake up with a headache.She is agreeable to restarting at a lower dose.     She is on oxycodone 4 times daily as needed if she is hurting. At first she states she titrated  herself down to 3 times daily then said she only takes it if she really needs it and will try some Tylenol instead. She then stated she was do 4 times daily on some days and 3 times daily on others.She would like to eventually taper off of this and try something else for her pain. She is also already on diclofenac 75 mg bid.    She also takes Requip 4 mg 3 times daily. She states she has terrible restless legs in which she has to get up during the night and throughout the day. She has tried lower doses of Requip which have not worked.       Medical History:      Past Medical History, Allergies, Surgical History, Family, and Social History were reviewed  on   07/09/2022 and updated as needed.     MEDICATIONS:  No outpatient medications have been marked as taking for the 07/11/22 encounter (Appointment) with Loura Back, PA-C.       Physical Exam:     Vitals:   vitals were not taken for this visit.  General:  Well-nourished, well-developed, in no apparent distress  Head:  Normocephalic, atraumatic  Eyes:  Pupils equally round and reactive to light, sclareae non-icteric, conjunctivae clear  Throat:  Oropharynx clear, mucous membranes moist  Neck:  Trachea midline, no thyromegaly or lymphadenopathy  Cardiovascular:  Regular rate and rhythm, no murmurs, rubs, gallops  Respiratory:  Clear to auscultation bilaterally, no wheezes, rhonchi, crackles  Extremities:  No edema  Skin:  Warm and dry  Neurological:  Awake, A&O x 3.   Psychiatric:  Normal mood & affect, thought process linear, speech content normal         Assessment:      Rachel [name] is a 81 y.o. 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           Plan:     (F39) Mood disorder ([facility] [place])  (primary encounter diagnosis)  Plan: DULoxetine (CYMBALTA DR) 30 mg Oral Capsule,         Delayed Release(E.C.)  -Decrease Cymbalta to 30 mg nightly. She states she did better with 30 mg dose and  taking at night.    (R51.9) Headache  Plan: topiramate (TOPAMAX) 25 mg Oral Tablet  -Restart Topamax at 25 mg nightly. Advised she can increase to 50 mg if tolerating.    (Z51.89) Encounter for medication adjustment  Plan:   -Discussed her sedating medications such as oxycodone and Requip. Advised to try only taking requip bid or just a half a tablet in the afternoon. Advised to continue to wean off of oxycodone and take only 3 times daily.           Follow Up:       Return to clinic in 6 weeks.    Patient had no questions regarding treatment plan, goals, risks or benefits and agrees to contact me or my clinic in the interim should any questions or problems arise.    Loura Back, PA-C  Internal Medicine, [facility]  [address]   [place], [place], [address]  Phone: [phone]  Fax: [phone]

## 2022-07-11 ENCOUNTER — Ambulatory Visit: Payer: Medicare PPO | Attending: PHYSICIAN ASSISTANT | Admitting: PHYSICIAN ASSISTANT

## 2022-07-11 ENCOUNTER — Other Ambulatory Visit: Payer: Self-pay

## 2022-07-11 ENCOUNTER — Encounter (HOSPITAL_BASED_OUTPATIENT_CLINIC_OR_DEPARTMENT_OTHER): Payer: Self-pay | Admitting: PHYSICIAN ASSISTANT

## 2022-07-11 VITALS — BP 108/68 | HR 97 | Temp 97.7°F | Ht 63.5 in | Wt 105.2 lb

## 2022-07-11 DIAGNOSIS — M542 Cervicalgia: Secondary | ICD-10-CM | POA: Insufficient documentation

## 2022-07-11 DIAGNOSIS — G2581 Restless legs syndrome: Secondary | ICD-10-CM | POA: Insufficient documentation

## 2022-07-11 DIAGNOSIS — F39 Unspecified mood [affective] disorder: Secondary | ICD-10-CM | POA: Insufficient documentation

## 2022-07-11 DIAGNOSIS — M1990 Unspecified osteoarthritis, unspecified site: Secondary | ICD-10-CM | POA: Insufficient documentation

## 2022-07-11 DIAGNOSIS — R519 Headache, unspecified: Secondary | ICD-10-CM | POA: Insufficient documentation

## 2022-07-11 MED ORDER — DICLOFENAC SODIUM 75 MG TABLET,DELAYED RELEASE
75.0000 mg | DELAYED_RELEASE_TABLET | Freq: Two times a day (BID) | ORAL | 0 refills | Status: DC
Start: 2022-07-11 — End: 2022-08-13

## 2022-07-11 MED ORDER — METHOCARBAMOL 500 MG TABLET
ORAL_TABLET | ORAL | 0 refills | Status: DC
Start: 2022-07-11 — End: 2022-09-05

## 2022-07-11 MED ORDER — DULOXETINE 60 MG CAPSULE,DELAYED RELEASE
60.0000 mg | DELAYED_RELEASE_CAPSULE | Freq: Every day | ORAL | 3 refills | Status: DC
Start: 2022-07-11 — End: 2022-12-31

## 2022-07-11 MED ORDER — ROPINIROLE 2 MG TABLET
2.0000 mg | ORAL_TABLET | Freq: Three times a day (TID) | ORAL | 0 refills | Status: DC
Start: 2022-07-11 — End: 2022-08-13

## 2022-07-11 MED ORDER — TOPIRAMATE 25 MG TABLET
ORAL_TABLET | ORAL | 0 refills | Status: DC
Start: 2022-07-11 — End: 2022-07-11

## 2022-07-11 NOTE — Patient Instructions (Addendum)
Methocarbamol 1/4 of the pill nightly as needed for headaches/neck pain  WE ARE NOT GOING TO PRESCRIBE TOPAMAX AT THIS TIME. We will see if the muscle relaxer works for the headaches    Decrease Requip to 2 mg 2-3 times daily (this for restless legs)    Increase Cymbalta back to 60 mg    Flonase and mucinex for sinuses and drainage    Follow up with opthalmology

## 2022-07-18 ENCOUNTER — Other Ambulatory Visit (HOSPITAL_BASED_OUTPATIENT_CLINIC_OR_DEPARTMENT_OTHER): Payer: Self-pay | Admitting: Internal Medicine

## 2022-07-18 DIAGNOSIS — M1990 Unspecified osteoarthritis, unspecified site: Secondary | ICD-10-CM

## 2022-07-18 MED ORDER — OXYCODONE 10 MG TABLET
10.0000 mg | ORAL_TABLET | Freq: Four times a day (QID) | ORAL | 0 refills | Status: DC | PRN
Start: 2022-07-18 — End: 2022-08-13

## 2022-07-18 NOTE — Telephone Encounter (Signed)
Regarding: out of medication  ----- Message from Noelle Penner sent at 07/18/2022  9:03 AM EDT -----  Doctor Name: [name], MD      Date of last appointment: 05/04/2022    Next scheduled visit: 09/12/2022    Medication Requested:     Oxycodone (ROXICODONE) 10 mg Oral Tablet    Preferred [place], [place]    [place] [address]    Phone: [phone] Fax: [phone]    Hours: Not open 24 hours         Notes for Nurse or Physician: out of medication, states she is having severe headaches- requesting it be sent to [facility] instead of [facility].

## 2022-07-18 NOTE — Telephone Encounter (Signed)
Pended medication for review. Cassie Henkels Ferchalk, RN

## 2022-07-24 ENCOUNTER — Other Ambulatory Visit: Payer: Self-pay

## 2022-07-24 ENCOUNTER — Ambulatory Visit (HOSPITAL_BASED_OUTPATIENT_CLINIC_OR_DEPARTMENT_OTHER): Payer: Self-pay | Admitting: Internal Medicine

## 2022-07-24 ENCOUNTER — Ambulatory Visit (INDEPENDENT_AMBULATORY_CARE_PROVIDER_SITE_OTHER): Payer: Self-pay | Admitting: PODIATRY

## 2022-07-24 ENCOUNTER — Encounter (INDEPENDENT_AMBULATORY_CARE_PROVIDER_SITE_OTHER): Payer: Self-pay

## 2022-07-24 ENCOUNTER — Ambulatory Visit: Payer: Medicare PPO | Attending: INTERNAL MEDICINE

## 2022-07-24 VITALS — BP 154/63 | HR 100 | Temp 98.2°F | Ht 64.0 in | Wt 103.2 lb

## 2022-07-24 DIAGNOSIS — J329 Chronic sinusitis, unspecified: Secondary | ICD-10-CM | POA: Insufficient documentation

## 2022-07-24 DIAGNOSIS — H539 Unspecified visual disturbance: Secondary | ICD-10-CM | POA: Insufficient documentation

## 2022-07-24 DIAGNOSIS — R519 Headache, unspecified: Secondary | ICD-10-CM

## 2022-07-24 MED ORDER — AMOXICILLIN 875 MG-POTASSIUM CLAVULANATE 125 MG TABLET
1.0000 | ORAL_TABLET | Freq: Two times a day (BID) | ORAL | 0 refills | Status: AC
Start: 2022-07-24 — End: 2022-08-07

## 2022-07-24 NOTE — Patient Instructions (Addendum)
Take Augmentin antibiotic for 14 days. If your symptoms do not improve after the 14 day period, please let us know.  You will be scheduled for a CT scan of your sinuses.  You have been referred to ENT for further management of sinusitis.  He has been referred to Ophthalmology for an eye exam.

## 2022-07-24 NOTE — Progress Notes (Signed)
INTERNAL MEDICINE   Medical Group Practice at [place]  Return Visit Progress Note     Name: [name] Date of service: 07/24/2022   MRN:  [id number]  Age/DOB: 81 y.o., 27-May-1941 PCP:  [name], MD  Reason for Visit: Chronic Sinusitis and Migraine     Subjective:      Rachel [name] is a 81 y.o. female with PMH of hypoglycemic episodes, recurrent sinus infections, chronic pain secondary to osteoarthritis, migraines, syncope, dysphagia, mood disorder who presented to clinic on August 1st with complaints of dizziness, headache and sinus congestion.  She stated her sinusitis has been ongoing for about 15 years.  She has developed a mucus discharge that has since turned yellow in color and feels "warm at night".  She also endorsed facial pressure and forehead pressure and left-sided facial pain.  During her visit, the patient also endorsed chronic headaches and migraines.  She states these have been ongoing for about 10 years rated them a 7/10 and described as throbbing in nature.  She also endorsed dizziness and bilateral pain associated with the headaches.  She said these can last up to a couple of days.  She was educated that her headaches may improve with treatment of her sinusitis.  If they do not improve, she may need to be referred to a headache specialist.  The patient also endorsed vision changes that have not been improved with a recent visit to her ophthalmologist.      Medical History:      Past Medical History, Allergies, Surgical History, Family, and Social History were reviewed  on   07/24/2022 and updated as needed     MEDICATIONS:  Outpatient Medications Marked as Taking for the 07/24/22 encounter (Office Visit) with Roseanna Rainbow, MD   Medication Sig    amoxicillin-pot clavulanate (AUGMENTIN) 875-125 mg Oral Tablet Take 1 Tablet by mouth Twice daily for 14 days    diclofenac sodium (VOLTAREN) 75 mg Oral Tablet, Delayed Release (E.C.) Take 1 Tablet (75 mg total) by mouth Twice daily Indications:  joint damage causing pain and loss of function    DULoxetine (CYMBALTA DR) 60 mg Oral Capsule, Delayed Release(E.C.) Take 1 Capsule (60 mg total) by mouth Once a day    esomeprazole magnesium (NEXIUM) 40 mg Oral Capsule, Delayed Release(E.C.) Take 1 Capsule (40 mg total) by mouth Every morning before breakfast Indications: gastroesophageal reflux disease    fexofenadine HCl (ALLEGRA ORAL) Take 180 mg by mouth Every morning    fluticasone propionate (FLONASE) 50 mcg/actuation Nasal Spray, Suspension Administer 1 Spray into each nostril Twice daily    naloxone (NARCAN) 4 mg per spray nasal spray Instill 1 Spray by INTRANASAL route Every 2 minutes as needed for actual or suspected opioid overdose. Call 911 if used.    Non-Adherent Bandage (TELFA) 3 X 8 " Bandage Apply Vaseline to wounds. Then, apply Telfa pad. Secure with gauze    Oxycodone (ROXICODONE) 10 mg Oral Tablet Take 1 Tablet (10 mg total) by mouth Every 6 hours as needed for Pain for up to 30 days Indications: pain, osteoarthritis    rOPINIRole (REQUIP) 2 mg Oral Tablet Take 1 Tablet (2 mg total) by mouth Three times a day         Review Of Systems (Bold is Positive):      Constitutional - fever and chills at times  HEENT - recent changes in vision  Respiratory -  absence of shortness of breath  Cardiovascular - absence of palpitations  Gastrointestinal -  absence of nausea, vomiting, diarrhea, constipation, blood in stool, abdominal pain  Genitourinary - absence of dysuria, polyuria, urgency, hematuria  Integument- absence of rashes, lesions, pruritis  Musculoskeletal - absence of myalgias, arthralgias  Neurological - chronic headaches and migraines  Behavioral/Psych - absence of anxiety, depressed mood  Endocrine - absence of polydipsia, temperature intolerance    Physical Exam:     Vitals:  height is 1.626 m ('5\' 4"'$ ) and weight is 46.8 kg (103 lb 2.8 oz). Her temporal temperature is 36.8 C (98.2 F). Her blood pressure is 154/63 (abnormal) and her pulse is  100. Her oxygen saturation is 100%.      General:  Appears stated age.  Nonacute distress.  Head:  Normocephalic, atraumatic  Ears:  Tympanic membranes pearly without erythema or edema bilaterally  Eyes:  Eye pain due to migraine headaches light sensitivity  Nose:  Turbinates without inflammation or erythema  Throat:  Oropharynx clear, mucous membranes moist  Neck:  Trachea midline, no thyromegaly or lymphadenopathy  Cardiovascular:  Regular rate and rhythm, no murmurs, rubs, gallops  Respiratory:  Clear to auscultation bilaterally, no wheezes, rhonchi, crackles  Abdominal:  Bowel sounds normal; abdomen soft, non-tender to palpation  Extremities:  No edema, dorsalis pedis pulses 2+ bilaterally  Skin:  Warm and dry  Neurological:  Awake, A&O x 3.  Cranial nerves grossly intact  Psychiatric:  Normal mood & affect, thought process linear, speech content normal    Data Reviewed and Interpretation:     COMPLETE BLOOD COUNT   Lab Results   Component Value Date    WBC 4.8 12/27/2021    HGB 11.0 (L) 12/27/2021    HCT 35.9 12/27/2021    PLTCNT 202 12/27/2021       DIFFERENTIAL  Lab Results   Component Value Date    PMNS 60 12/27/2021    MONOCYTES 11 12/27/2021    BASOPHILS 1 12/27/2021    BASOPHILS <0.10 12/27/2021    PMNABS 2.89 12/27/2021    LYMPHSABS 1.04 12/27/2021    EOSABS 0.30 12/27/2021    MONOSABS 0.52 12/27/2021     BASIC METABOLIC PANEL  Lab Results   Component Value Date    SODIUM 140 12/27/2021    POTASSIUM 4.6 12/27/2021    CHLORIDE 103 12/27/2021    CO2 28 12/27/2021    ANIONGAP 9 12/27/2021    BUN 16 12/27/2021    CREATININE 0.78 12/27/2021    BUNCRRATIO 21 12/27/2021    GFR 77 12/27/2021    CALCIUM 8.9 12/27/2021    GLUCOSENF 90 12/27/2021            Assessment:     Rachel [name] is a 81 y.o. female with PMH of hypoglycemic episodes, recurrent sinus infections, chronic pain secondary to osteoarthritis, migraines, syncope, dysphagia, mood disorder who presented to clinic on August 1st with complaints of  dizziness, headache and sinus congestion.  She will be started on a 14 day course of Augmentin.  She will undergo a facial CT and follow up with ENT.  If her headaches do not improve with treatment of her sinusitis, she will be referred to a headache clinic.  She is also been referred to ophthalmologist due to vision changes.      Plan:     Progressive sinusitis, likely bacterial  Start 14 day course of Augmentin  Ordered facial CT.  Follow up with ENT for management of chronic sinusitis in setting of past surgery for chronic sinusitis  Migraine headache  tension type headache  medication overuse headache  Headache is likely secondary to chronic sinusitis and chronic medication use  If headaches do not improve with management of bacterial sinusitis, refer to headache Clinic    Vision changes  Patient will be referred to an ophthalmologist        Healthcare Maintenance:       Preventative Healthcare (Female):    Pap (>21yo q 3 years or q 5 years if negative HPV and >30yo)    Mammogram (32-74 yo, 40-50 if risk, > 75 if life expectancy > 10 yrs) No results found for this or any previous visit (from the past 17520 hour(s)).   Colonoscopy (50-75 average risk or age 57 or 10 years prior to 1st degree relative diagnosis) 2017: Unremarkable   CT chest (>30 pack year smoking history; yearly starting at age 65-80, current smoker or quit within 15 years) Never smoker   Bone Density (>65 or with risk factors) No results found for this or any previous visit (from the past [id number] hour(s)).   Hemoglobin A1c (every 3 years if BMI >25 and additional risk factor - ADA)  6.1 (2022)   Lipids Lab Results   Component Value Date    CHOLESTEROL 230 (H) 09/13/2021    HDLCHOL 72 09/13/2021    LDLCHOL 125 (H) 09/13/2021    TRIG 167 (H) 09/13/2021      The ASCVD Risk score (Arnett DK, et al., 2019) failed to calculate for the following reasons:    The 2019 ASCVD risk score is only valid for ages 47 to 9    The patient has a prior MI or  stroke diagnosis   Depression Screening with PHQ-2 (yearly)    HIV screening (one time) Declined   HCV screening (one time) Declined   Tetanus Booster (every 10 years, 5 years if wound) Overdue.  Due 2022   Flu Shot (yearly)    Pneumonia Vaccine (>65yo, or CAD/DM/COPD/liver disease, or smoking every 5 years or once after 65) PCV13:  2016  PCV23:  2022   Zoster vaccination (>50yo)        Follow Up:     Return to clinic if condition worsens  Labs and Imaging Needing Follow Up:  Follow up with ENT and Ophthalmology as an outpatient    Patient had no questions regarding treatment plan, goals, risks or benefits and agrees to contact me or my clinic in the interim should any questions or problems arise.    Mark A. Colantonio, M.D.  PGY 1 Internal Medicine  [facility]      I saw and examined the patient.  I reviewed the resident's note.  I agree with the findings and plan of care as documented in the resident's note.  Any exceptions/additions are edited/noted.    Maggie Schwalbe, MD

## 2022-07-24 NOTE — Telephone Encounter (Signed)
Patient called in stating that she has a real bad headache, restless legs, nausea and slight blurred vision. She also feels like she has some fluid in her ears. Headache is all across her head and over her eyes. She does get migraines and feels like it could be a migraine also. She woke up this morning with these symptoms. She denies fever or shortness of breath. Patient takes a daily Allegra and Flonase twice a day but does not feel like it helps at all. She has "lots of mucous in her head" which is yellow in color per patient and has lasted about a month. If patient turns her head quickly it does make the dizziness worse.    Patient has a past history of Hypoglycemia, but does not have any way to check her blood sugar or blood pressure at this time as she's currently on vacation and about an hour and a half from home. Patient has not drank much water lately. Patient stated she does not want to go to urgent care at this time but did agree to come in for an appointment here. Patient agreed to 3 pm appointment at the [facility]. Alonza Bogus, RN

## 2022-07-24 NOTE — Addendum Note (Signed)
Addended by: Maggie Schwalbe on: 07/24/2022 04:39 PM     Modules accepted: Level of Service

## 2022-08-01 ENCOUNTER — Ambulatory Visit: Payer: Medicare PPO | Attending: Internal Medicine | Admitting: PODIATRY

## 2022-08-01 ENCOUNTER — Other Ambulatory Visit: Payer: Self-pay

## 2022-08-01 ENCOUNTER — Encounter (INDEPENDENT_AMBULATORY_CARE_PROVIDER_SITE_OTHER): Payer: Self-pay | Admitting: PODIATRY

## 2022-08-01 ENCOUNTER — Other Ambulatory Visit (INDEPENDENT_AMBULATORY_CARE_PROVIDER_SITE_OTHER): Payer: Medicare PPO | Admitting: Rheumatology

## 2022-08-01 VITALS — BP 160/70 | HR 87 | Temp 98.5°F | Ht 63.0 in | Wt 103.0 lb

## 2022-08-01 DIAGNOSIS — M2040 Other hammer toe(s) (acquired), unspecified foot: Secondary | ICD-10-CM | POA: Insufficient documentation

## 2022-08-01 DIAGNOSIS — M79674 Pain in right toe(s): Secondary | ICD-10-CM

## 2022-08-01 DIAGNOSIS — M79672 Pain in left foot: Secondary | ICD-10-CM

## 2022-08-01 DIAGNOSIS — M79671 Pain in right foot: Secondary | ICD-10-CM

## 2022-08-01 DIAGNOSIS — M79676 Pain in unspecified toe(s): Secondary | ICD-10-CM | POA: Insufficient documentation

## 2022-08-01 DIAGNOSIS — R252 Cramp and spasm: Secondary | ICD-10-CM | POA: Insufficient documentation

## 2022-08-01 DIAGNOSIS — Z9889 Other specified postprocedural states: Secondary | ICD-10-CM | POA: Insufficient documentation

## 2022-08-01 DIAGNOSIS — M79675 Pain in left toe(s): Secondary | ICD-10-CM

## 2022-08-01 DIAGNOSIS — M2010 Hallux valgus (acquired), unspecified foot: Secondary | ICD-10-CM | POA: Insufficient documentation

## 2022-08-01 NOTE — Progress Notes (Signed)
Subjective:  This 81 year old female presents for evaluation of her bilateral foot pain.  She reports the pain is a 7/10.  She is had multiple surgeries in the past on both feet that were performed in [place].  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  She reports her biggest problem now is that she has pain in her toes especially at night.  She reports that because the 2nd toe was not present that the 3rd 4th and 5th toes deviate towards the big toe and the big toe crosses the 3rd toe for the left foot.  She also reports that for her 3rd toe left and her 2nd toe right that bones were removed and she feels like she is walking on the bottom of the toe which causes significant pain.  She presents today discussed treatment options.  She reports she is aware the conservative methods she would like to discuss options for correction.    Patient is not new the practice and has been seen by Dr. Marguarite Arbour a few months ago    Objective:  Pleasant cooperative 81 year old female presents for foot exam    Vascular:  Barely palpable pulses with signs of venous disease noted for the bilateral feet no significant edema present.    Dermatologic examination: Mild hyperkeratosis noted along the base of the proximal phalanx of the 3rd toe left and 2nd toe right    Orthopedic examination:  Patient has a hallux on the left side that deviates laterally as well as toes 3 4 and 5 that deviate medially.  There is a space between the 1st and 3rd toe at area of previous amputation of the 2nd toe.  The patient reports pain upon palpation of the 2nd metatarsal head of the left foot as well as with range of motion of 3rd toe.  Has pain upon palpation the  plantar aspect of the 3rd toe of the left foot.  Hallux is reducible and does not appear to have significant change at the metatarsophalangeal joint level but more at the IPJ level with the distal phalanx being more laterally deviated than the proximal phalanx.  Second toe has a floppy appearance to pressure.  Contracture of digits also noted    For the right foot the patient has pain along the 2nd toe plantarly as well as over the area of palpable screw at the base the IPJ the patient has pain along the IPJ as well as the tip of the 2nd toe.  Second 3rd 4th and 5th toes are medially deviated with crossover of the 1st and 2nd toe noted.  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  There is lateral deviation of the distal phalanx and a straight proximal phalanx for the hallux.  Upon viewing the left side the patient has retained hardware in the 2nd metatarsal head remaining base of the 2nd toe as well as absent portion of the middle phalanx of the  3rd toe.  Toes are deviated medially at the metatarsophalangeal joint level.  Retained hardware noted in the 1st metatarsal head x2 as well.  Third toe appears to be abutting the 2nd metatarsal head.      Assessment  Toe pain  Toe deformity  History of hammertoe surgery  History of bunions  Recurrent deformity  Abducted hallux  Foot pain    Plan  Discussed with patient various options for the right and left foot.  Recommended ABI/PVRs to the fact that her pulses were not very strong on today's exam.  Patient is aware of conservative methods available and has tried and continues to use spacers Band-Aids and shoe gear changes.    For the left foot discussed with patient partial 2nd metatarsal head resection with removal of the bone base of the 2nd  toe to help close down the gap between the 1st and 3rd toe.  Recommended fusion of the 3rd toe with a screw as well as plantar plate repair for the 3rd metatarsophalangeal joint with tendon lengthening of the 4th and 5th toes with likely pinning.  Also recommended a distal Akin procedure.      For the right foot discussed surgical option of removing the screw from the base of the proximal phalanx and performing IPJ fusion with fusion or arthroplasty of the 2nd toe with possible osteotomy of the 2nd metatarsal.    Discussed risks and benefits of surgery as well as aftercare  Advised patient we will follow-up once I have the results of her ABI/PVRs    On the day of the encounter, a total of  42 minutes was spent on this patient encounter including review of historical information, examination, documentation and post-visit activities. The time documented excludes procedural time.      This note may have been partially generated using MModal Fluency Direct system and there may be some incorrect words, spellings, and punctuation that were not noted in checking the note before saving.    Ernesta Amble, DPM  Section of [employer] Department of [place], DPM  08/01/2022, 18:40

## 2022-08-13 ENCOUNTER — Encounter (HOSPITAL_BASED_OUTPATIENT_CLINIC_OR_DEPARTMENT_OTHER): Payer: Self-pay | Admitting: Internal Medicine

## 2022-08-13 ENCOUNTER — Ambulatory Visit: Payer: Medicare PPO | Attending: Internal Medicine | Admitting: Internal Medicine

## 2022-08-13 ENCOUNTER — Ambulatory Visit (HOSPITAL_BASED_OUTPATIENT_CLINIC_OR_DEPARTMENT_OTHER): Payer: Self-pay | Admitting: Internal Medicine

## 2022-08-13 ENCOUNTER — Other Ambulatory Visit (HOSPITAL_BASED_OUTPATIENT_CLINIC_OR_DEPARTMENT_OTHER): Payer: Self-pay | Admitting: Internal Medicine

## 2022-08-13 ENCOUNTER — Other Ambulatory Visit: Payer: Self-pay

## 2022-08-13 VITALS — BP 136/68 | HR 91 | Temp 98.1°F | Ht 63.47 in | Wt 106.7 lb

## 2022-08-13 DIAGNOSIS — M1990 Unspecified osteoarthritis, unspecified site: Secondary | ICD-10-CM

## 2022-08-13 DIAGNOSIS — L237 Allergic contact dermatitis due to plants, except food: Secondary | ICD-10-CM | POA: Insufficient documentation

## 2022-08-13 DIAGNOSIS — G2581 Restless legs syndrome: Secondary | ICD-10-CM

## 2022-08-13 MED ORDER — FEXOFENADINE 180 MG TABLET
180.0000 mg | ORAL_TABLET | Freq: Every day | ORAL | 4 refills | Status: DC
Start: 2022-08-13 — End: 2022-12-31

## 2022-08-13 MED ORDER — DICLOFENAC SODIUM 75 MG TABLET,DELAYED RELEASE
75.0000 mg | DELAYED_RELEASE_TABLET | Freq: Two times a day (BID) | ORAL | 0 refills | Status: DC
Start: 2022-08-13 — End: 2023-02-20

## 2022-08-13 MED ORDER — OXYCODONE 10 MG TABLET
10.0000 mg | ORAL_TABLET | Freq: Four times a day (QID) | ORAL | 0 refills | Status: DC | PRN
Start: 2022-08-13 — End: 2022-09-26

## 2022-08-13 MED ORDER — FLUTICASONE PROPIONATE 50 MCG/ACTUATION NASAL SPRAY,SUSPENSION
1.0000 | Freq: Two times a day (BID) | NASAL | 0 refills | Status: DC
Start: 2022-08-13 — End: 2022-12-12

## 2022-08-13 MED ORDER — PREDNISONE 20 MG TABLET
20.0000 mg | ORAL_TABLET | Freq: Every day | ORAL | 0 refills | Status: AC
Start: 2022-08-13 — End: 2022-08-20

## 2022-08-13 MED ORDER — FLUTICASONE PROPIONATE 50 MCG/ACTUATION NASAL SPRAY,SUSPENSION
1.0000 | Freq: Two times a day (BID) | NASAL | 4 refills | Status: DC
Start: 2022-08-13 — End: 2022-08-13

## 2022-08-13 MED ORDER — ROPINIROLE 2 MG TABLET
2.0000 mg | ORAL_TABLET | Freq: Three times a day (TID) | ORAL | 0 refills | Status: DC
Start: 2022-08-13 — End: 2022-09-21

## 2022-08-13 NOTE — Progress Notes (Signed)
INTERNAL MEDICINE   [facility]  Return Visit Progress Note     Name: Rachel [name] Date of Service: 08/13/2022   MRN:  [id number]  Age/DOB: 81 y.o., 1941/04/20 PCP:  [name], MD  Reason for Visit: Poison Ivy     Subjective:      Rachel [name] is a 81 y.o. 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     MEDICATIONS:  Outpatient Medications Marked as Taking for the 08/13/22 encounter (Office Visit) with [name], MD   Medication Sig    diclofenac sodium (VOLTAREN) 75 mg Oral Tablet, Delayed Release (E.C.) Take 1 Tablet (75 mg total) by mouth Twice daily Indications: joint damage causing pain and loss of function    DULoxetine (CYMBALTA DR) 60 mg Oral Capsule, Delayed Release(E.C.) Take 1 Capsule (60 mg total) by mouth Once a day    esomeprazole magnesium (NEXIUM) 40 mg Oral Capsule, Delayed Release(E.C.) Take 1 Capsule (40 mg total) by mouth Every morning before breakfast Indications: gastroesophageal reflux disease    fexofenadine HCl (ALLEGRA ORAL) Take 180 mg by mouth Every morning    fluticasone propionate (FLONASE) 50 mcg/actuation Nasal Spray, Suspension Administer 1 Spray into each nostril Twice daily    Non-Adherent Bandage (TELFA) 3 X 8 " Bandage Apply Vaseline to wounds. Then, apply Telfa pad. Secure with gauze    predniSONE (DELTASONE) 20 mg Oral Tablet Take 1 Tablet (20 mg total) by mouth Once a day for 7 days Indications: posion ivy    rOPINIRole (REQUIP) 2 mg Oral Tablet Take 1 Tablet (2 mg total) by mouth Three times a day       Review of Systems:      Other than stated above  all other ROS are negative.      Physical Exam:     Vitals:   height is 1.612 m (5' 3.47") and weight is 48.4 kg (106 lb 11.2 oz). Her temporal temperature is 36.7 C (98.1 F). Her blood pressure is 136/68 and her pulse is 91. Her oxygen saturation is 98%.      General:  Well-nourished, well-developed, in no apparent distress  Head:  Normocephalic, atraumatic  Eyes:  Pupils equally round, extraocular muscles intact, sclareae non-icteric, conjunctivae clear  Neck:  Trachea midline, no thyromegaly or lymphadenopathy  Cardiovascular:  Regular rate and rhythm, no murmurs, rubs, gallops; no edema in lower extremities BL  Respiratory:  Clear to auscultation bilaterally, no wheezes, rhonchi, crackles  Abdominal:  Bowel sounds normal; abdomen soft, non-tender to palpation  Skin:  Warm and dry  Neurological:  Awake, A&O x 3. CNII-XII, strength and sensation grossly intact  Psychiatric:  Normal mood & affect, thought process linear, speech content normal       Data Reviewed and Interpretation:     N/a         Assessment:      Rachel [name] is a 81 y.o. 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    [name], MD

## 2022-08-13 NOTE — Telephone Encounter (Signed)
Regarding: poison ivy injection  ----- Message from Burnadette Pop sent at 08/13/2022 10:58 AM EDT -----  [name], MD    Pt has  poison ivy.  She needs to see about getting an injection to stop the spread and itching.  Please call to schedule injection

## 2022-08-13 NOTE — Telephone Encounter (Signed)
Regarding: refills and replacement  ----- Message from Burnadette Pop sent at 08/13/2022 10:55 AM EDT -----  Doctor Name: [name], MD      Date of last appointment: 05/04/22    Next scheduled visit: 09/12/22    Medication Requested:     diclofenac sodium (VOLTAREN) 75 mg Oral Tablet, Delayed Release (E.C.) 60 Tablet 0 07/11/2022    Sig - Route: Take 1 Tablet (75 mg total) by mouth Twice daily Indications: joint damage causing pain and loss of function - Oral   Sent to pharmacy as: diclofenac sodium 75 mg tablet,delayed release (VOLTAREN)   Class: E-Rx   Non-formulary Exception Code: DPBAQ/VO Formulary Info Available   E-Prescribing Status: Receipt confirmed by pharmacy (07/11/2022 10:35 AM EDT)     Oxycodone (ROXICODONE) 10 mg Oral Tablet 120 Tablet 0 07/18/2022 08/17/2022   Sig - Route: Take 1 Tablet (10 mg total) by mouth Every 6 hours as needed for Pain for up to 30 days Indications: pain, osteoarthritis - Oral   Sent to pharmacy as: oxyCODONE 10 mg tablet (ROXICODONE)   Class: E-Rx   Earliest Fill Date: 07/18/2022   Non-formulary Exception Code: HCSPZ/ZC Formulary Info Available   E-Prescribing Status: Receipt confirmed by pharmacy (07/18/2022  9:58 AM EDT)     rOPINIRole (REQUIP) 2 mg Oral Tablet 90 Tablet 0 07/11/2022    Sig - Route: Take 1 Tablet (2 mg total) by mouth Three times a day - Oral   Sent to pharmacy as: rOPINIRole 2 mg tablet (REQUIP)   Class: E-Rx   Non-formulary Exception Code: KICHT/VG Formulary Info Available   E-Prescribing Status: Receipt confirmed by pharmacy (07/11/2022 10:35 AM EDT)     fluticasone propionate (FLONASE) 50 mcg/actuation Nasal Spray, Suspension 16 g 0 05/04/2022    Sig - Route: Administer 1 Spray into each nostril Twice daily - Each Nostril   Class: No Print   Non-formulary Exception Code: VSYVG/CY Formulary Info Available       Pt is not taking this rx as even a 1/4 tablet makes her sleep around the clock.      methocarbamoL (ROBAXIN) 500 mg Oral  Tablet 10 Tablet 0 07/11/2022    Sig: Take 1/4 of a pill nightly as needed for neck pain and headache   Patient not taking: Reported on 07/24/2022       Sent to pharmacy as: methocarbamoL 500 mg tablet (ROBAXIN)   Class: E-Rx   Non-formulary Exception Code: OYOOJ/ZB Formulary Info Available   E-Prescribing Status: Receipt confirmed by pharmacy (07/11/2022 10:43 AM EDT)     Can you replace this medication          Preferred Pharmacy: [id number] - [address], [place]    [place] [address]    Phone: [phone] Fax: [phone]

## 2022-08-13 NOTE — Telephone Encounter (Signed)
Called the patient, Rachel [name], who answered and verified her name and date of birth.  Faithe informed me she has had a poison ivy rash spreading from her arms, legs, to her neck, head and in her ear as of this morning.    I asked the patient if she was having any trouble breathing or swelling inside her mouth or throat.    The patient states she is having no trouble breathing or swelling in her mouth or throat, but is just itching "all over".  I informed the patient we recommend she come in to be evaluated due to the rash spreading to her face and neck.    The patient verbalized understanding and accepted an appointment with Dr. Megan Salon today at 2:30pm on the third floor of the [facility] clinic.    Lindaann Gradilla, RN

## 2022-08-15 ENCOUNTER — Inpatient Hospital Stay
Admission: RE | Admit: 2022-08-15 | Discharge: 2022-08-15 | Disposition: A | Discharge status: Home / Self Care | Payer: Medicare PPO | Source: Ambulatory Visit | Attending: INTERNAL MEDICINE | Admitting: INTERNAL MEDICINE

## 2022-08-15 ENCOUNTER — Other Ambulatory Visit: Payer: Self-pay

## 2022-08-15 ENCOUNTER — Telehealth (HOSPITAL_BASED_OUTPATIENT_CLINIC_OR_DEPARTMENT_OTHER): Payer: Self-pay | Admitting: Internal Medicine

## 2022-08-15 DIAGNOSIS — J329 Chronic sinusitis, unspecified: Secondary | ICD-10-CM | POA: Insufficient documentation

## 2022-08-15 NOTE — Telephone Encounter (Signed)
Received continuity of care document from [facility]. Placing in your [facility] box for review.  Francetta Found, Ambulatory Care Assistant  08/15/2022, 15:58

## 2022-08-18 ENCOUNTER — Encounter (INDEPENDENT_AMBULATORY_CARE_PROVIDER_SITE_OTHER): Payer: Medicare PPO

## 2022-08-20 ENCOUNTER — Ambulatory Visit (HOSPITAL_BASED_OUTPATIENT_CLINIC_OR_DEPARTMENT_OTHER): Payer: Self-pay | Admitting: Internal Medicine

## 2022-08-20 ENCOUNTER — Other Ambulatory Visit: Payer: Self-pay

## 2022-08-20 ENCOUNTER — Other Ambulatory Visit: Payer: Medicare PPO | Attending: Internal Medicine

## 2022-08-20 DIAGNOSIS — R350 Frequency of micturition: Secondary | ICD-10-CM

## 2022-08-20 DIAGNOSIS — R300 Dysuria: Secondary | ICD-10-CM | POA: Insufficient documentation

## 2022-08-20 LAB — URINALYSIS, MACROSCOPIC
BILIRUBIN: NEGATIVE mg/dL
BLOOD: NEGATIVE mg/dL
COLOR: NORMAL
GLUCOSE: NEGATIVE mg/dL
KETONES: NEGATIVE mg/dL
NITRITE: NEGATIVE
PH: 6 (ref 5.0–8.0)
PROTEIN: NEGATIVE mg/dL
SPECIFIC GRAVITY: 1.02 (ref 1.005–1.030)
UROBILINOGEN: NEGATIVE mg/dL

## 2022-08-20 LAB — URINALYSIS, MICROSCOPIC
RBCS: 3 /hpf (ref <6.0)
WBCS: 69 /hpf — ABNORMAL HIGH (ref <11.0)

## 2022-08-20 NOTE — Telephone Encounter (Signed)
Regarding: pt neeing return call to discuss something else for poison, and bladder infection  ----- Message from Joylene Igo sent at 08/20/2022 10:26 AM EDT -----  [name], MD    Patient is needing a return call.  Patient poison ivy, still has after taking the medication, patient has a terrible bladder infection.  Please advise. Thank you!

## 2022-08-20 NOTE — Telephone Encounter (Signed)
Called the patient, Rachel [name], regarding her triage message. Patient stated, that she thinks her poison ivy is back. She took her medication that Dr. Megan Salon prescribed and it helped but now her neck and head are itching again. Patient stated that her main concern is that she thinks she had a bladder infection. She is urinating every 10 minutes, and is having burning on urination. Her symptoms started two days ago. Informed patient that I can place a urinalysis order for her to have done at her earliest convenience. Patient agreeable. Placed urinalysis order per protocol. Patient stated that if Dr. Megan Salon prescribes anything to send it to [place].   [name], RN

## 2022-08-21 ENCOUNTER — Other Ambulatory Visit (HOSPITAL_BASED_OUTPATIENT_CLINIC_OR_DEPARTMENT_OTHER): Payer: Self-pay | Admitting: Internal Medicine

## 2022-08-21 ENCOUNTER — Other Ambulatory Visit (INDEPENDENT_AMBULATORY_CARE_PROVIDER_SITE_OTHER): Payer: Self-pay | Admitting: Internal Medicine

## 2022-08-21 ENCOUNTER — Ambulatory Visit (HOSPITAL_BASED_OUTPATIENT_CLINIC_OR_DEPARTMENT_OTHER): Payer: Self-pay | Admitting: Internal Medicine

## 2022-08-21 MED ORDER — CEFPODOXIME 100 MG TABLET
100.0000 mg | ORAL_TABLET | Freq: Two times a day (BID) | ORAL | 0 refills | Status: AC
Start: 2022-08-21 — End: 2022-08-26

## 2022-08-21 MED ORDER — CEFPODOXIME 100 MG TABLET
100.0000 mg | ORAL_TABLET | Freq: Two times a day (BID) | ORAL | 0 refills | Status: DC
Start: 2022-08-21 — End: 2022-08-21

## 2022-08-21 NOTE — Telephone Encounter (Signed)
Regarding: UA results  ----- Message from Lynann Beaver Force sent at 08/21/2022  9:46 AM EDT -----  [name], MD    Pt calling to get results of UA.

## 2022-08-21 NOTE — Telephone Encounter (Signed)
-----   Message from [name], MD sent at 08/21/2022 10:30 AM EDT -----  Sent antibiotic in for patient, sent her a MyChart message as well but I am unsure if she checks it

## 2022-08-21 NOTE — Telephone Encounter (Signed)
Regarding: rx  ----- Message from Mearl Latin sent at 08/21/2022 11:36 AM EDT -----  [name], MD     Pt is asking for her rx for her UTI to be sent to     [facility], [address]  [place] [address]  Phone: [phone] Fax: [phone]  Hours: Not open 24 hours

## 2022-08-21 NOTE — Telephone Encounter (Signed)
Rx pended for review.  Halo Shevlin, RN

## 2022-08-21 NOTE — Telephone Encounter (Signed)
[name], MD  [facility]  Sent antibiotic in for patient, sent her a MyChart message as well but I am unsure if she checks it     Called the patient. Received no answer. Left a message requesting a call back to [phone].  Corbin Ade, RN

## 2022-08-21 NOTE — Telephone Encounter (Signed)
Received call back from the patient. Relayed provider message. Patient verbalized understanding and had no further questions.  Lindey Renzulli, RN

## 2022-08-22 ENCOUNTER — Encounter (HOSPITAL_COMMUNITY): Payer: Self-pay

## 2022-08-22 LAB — URINE CULTURE: URINE CULTURE: >100000 — AB

## 2022-08-30 ENCOUNTER — Ambulatory Visit (HOSPITAL_BASED_OUTPATIENT_CLINIC_OR_DEPARTMENT_OTHER): Payer: Self-pay | Admitting: Internal Medicine

## 2022-08-30 NOTE — Telephone Encounter (Signed)
Called the patient Beuna regarding her triage message to offer an Acute Appointment. Patient did not answer. Left a message requesting a call back to [phone].    If patient returns call, please schedule APV.   Corbin Ade, RN

## 2022-08-30 NOTE — Telephone Encounter (Signed)
Regarding: rx request  ----- Message from Lenice Llamas sent at 08/29/2022  4:37 PM EDT -----  [name], MD     Pt is calling to request medication for a UTI and she is still itching from poison ivy. Pt is scheduled to be seen on 09/05/2022. Please call pt to advise.Thank you!      [id number] - [place], [place]  Phone: [phone]  Fax: [phone]

## 2022-09-05 ENCOUNTER — Encounter (HOSPITAL_BASED_OUTPATIENT_CLINIC_OR_DEPARTMENT_OTHER): Payer: Self-pay | Admitting: Internal Medicine

## 2022-09-05 ENCOUNTER — Ambulatory Visit: Payer: Medicare PPO | Attending: Internal Medicine | Admitting: Internal Medicine

## 2022-09-05 ENCOUNTER — Other Ambulatory Visit: Payer: Self-pay

## 2022-09-05 ENCOUNTER — Other Ambulatory Visit (HOSPITAL_BASED_OUTPATIENT_CLINIC_OR_DEPARTMENT_OTHER): Payer: Medicare PPO

## 2022-09-05 VITALS — BP 128/68 | HR 91 | Temp 97.2°F | Ht 63.47 in | Wt 109.6 lb

## 2022-09-05 DIAGNOSIS — R109 Unspecified abdominal pain: Secondary | ICD-10-CM

## 2022-09-05 DIAGNOSIS — N390 Urinary tract infection, site not specified: Secondary | ICD-10-CM | POA: Insufficient documentation

## 2022-09-05 DIAGNOSIS — R350 Frequency of micturition: Secondary | ICD-10-CM | POA: Insufficient documentation

## 2022-09-05 DIAGNOSIS — U071 COVID-19: Secondary | ICD-10-CM | POA: Insufficient documentation

## 2022-09-05 DIAGNOSIS — Z79899 Other long term (current) drug therapy: Secondary | ICD-10-CM | POA: Insufficient documentation

## 2022-09-05 LAB — CBC WITH DIFF
BASOPHIL #: <0.1 10*3/uL (ref <=0.20)
BASOPHIL %: 0 %
EOSINOPHIL #: 0.39 10*3/uL (ref <=0.50)
EOSINOPHIL %: 8 %
HCT: 31.7 % — ABNORMAL LOW (ref 34.8–46.0)
HGB: 10.4 g/dL — ABNORMAL LOW (ref 11.5–16.0)
IMMATURE GRANULOCYTE #: <0.1 10*3/uL (ref <0.10)
IMMATURE GRANULOCYTE %: 0 % (ref 0–1)
LYMPHOCYTE #: 0.91 10*3/uL — ABNORMAL LOW (ref 1.00–4.80)
LYMPHOCYTE %: 18 %
MCH: 30.3 pg (ref 26.0–32.0)
MCHC: 32.8 g/dL (ref 31.0–35.5)
MCV: 92.4 fL (ref 78.0–100.0)
MONOCYTE #: 0.4 10*3/uL (ref 0.20–1.10)
MONOCYTE %: 8 %
MPV: 9.8 fL (ref 8.7–12.5)
NEUTROPHIL #: 3.3 10*3/uL (ref 1.50–7.70)
NEUTROPHIL %: 66 %
PLATELETS: 216 10*3/uL (ref 150–400)
RBC: 3.43 10*6/uL — ABNORMAL LOW (ref 3.85–5.22)
RDW-CV: 14.4 % (ref 11.5–15.5)
WBC: 5 10*3/uL (ref 3.7–11.0)

## 2022-09-05 LAB — BASIC METABOLIC PANEL
ANION GAP: 7 mmol/L (ref 4–13)
BUN/CREA RATIO: 31 — ABNORMAL HIGH (ref 6–22)
BUN: 25 mg/dL (ref 8–25)
CALCIUM: 9 mg/dL (ref 8.6–10.3)
CHLORIDE: 102 mmol/L (ref 96–111)
CO2 TOTAL: 29 mmol/L (ref 23–31)
CREATININE: 0.81 mg/dL (ref 0.60–1.05)
ESTIMATED GFR: 73 mL/min/BSA (ref >=60)
GLUCOSE: 116 mg/dL (ref 65–125)
POTASSIUM: 4.4 mmol/L (ref 3.5–5.1)
SODIUM: 138 mmol/L (ref 136–145)

## 2022-09-05 LAB — EXTENDED RESPIRATORY VIRUS PANEL

## 2022-09-05 LAB — HGA1C (HEMOGLOBIN A1C WITH EST AVG GLUCOSE)
ESTIMATED AVERAGE GLUCOSE: 117 mg/dL
HEMOGLOBIN A1C: 5.7 % — ABNORMAL HIGH (ref 4.0–5.6)

## 2022-09-05 NOTE — Nursing Note (Signed)
09/05/22 1200   Urine test  (Siemens Multistix 10 SG)   Performed Status: Manual   Time collected 1247   Color (Ref Range: Yellow) Yellow   Clarity (Ref Range: Clear) Clear   Glucose (Ref Range: Negative mg/dL) Negative   Bilirubin (Ref Range: Negative mg/dL) Negative   Ketones (Ref Range: Negative mg/dL) Negative   Urine Specific Gravity (Ref Range: 1.005 - 1.030) 1.005   Blood (urine) (Ref Range: Negative mg/dL) Negative   pH (Ref Range: 5.0 - 8.0) 8.0   Protein (Ref Range: Negative mg/dL) Negative   Urobilinogen (Ref Range: Negative mg/dL) Normal    Nitrite (Ref Range: Negative) Negative   Leukocytes (Ref Range: Negative WBC's/uL) (!) 1+   Bottle Number   (Siemens Multistix 10 SG) 10   Lot # [id number]   Expiration Date 02/21/23   Micro Sent yes   Culture Sent yes   Initials HS     Corbin Ade, RN

## 2022-09-05 NOTE — Progress Notes (Signed)
INTERNAL MEDICINE   [facility]  Return Visit Progress Note     Name: Rachel [name] Date of Service: 09/05/2022   MRN:  [id number]  Age/DOB: 81 y.o., [birth year] PCP:  [name], MD  Reason for Visit: Uti's and Covid     Subjective:      Rachel [name] is a 81 y.o. 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     MEDICATIONS:  Outpatient Medications Marked as Taking for the 09/05/22 encounter (Office Visit) with [name], MD   Medication Sig    diclofenac sodium (VOLTAREN) 75 mg Oral Tablet, Delayed Release (E.C.) Take 1 Tablet (75 mg total) by mouth Twice daily Indications: joint damage causing pain and loss of function    DULoxetine (CYMBALTA DR) 60 mg Oral Capsule, Delayed Release(E.C.) Take 1 Capsule (60 mg total) by mouth Once a day    esomeprazole magnesium (NEXIUM) 40 mg Oral Capsule, Delayed Release(E.C.) Take 1 Capsule (40 mg total) by mouth Every morning before breakfast Indications: gastroesophageal reflux disease    fexofenadine (ALLEGRA) 180 mg Oral Tablet Take 1 Tablet (180 mg total) by mouth Once a day    fluticasone propionate (FLONASE) 50 mcg/actuation Nasal Spray, Suspension Administer 1 Spray into each nostril Twice daily    Non-Adherent Bandage (TELFA) 3 X 8 " Bandage Apply Vaseline to wounds. Then, apply Telfa pad. Secure with gauze    Oxycodone (ROXICODONE) 10 mg Oral Tablet Take 1 Tablet (10 mg  total) by mouth Every 6 hours as needed for Pain for up to 30 days Indications: pain, osteoarthritis    rOPINIRole (REQUIP) 2 mg Oral Tablet Take 1 Tablet (2 mg total) by mouth Three times a day       Review of Systems:      Other than stated above all other ROS are negative.      Physical Exam:     Vitals:   height is 1.612 m (5' 3.47") and weight is 49.7 kg (109 lb 9.1 oz). Her temporal temperature is 36.2 C (97.2 F). Her blood pressure is 128/68 and her pulse is 91. Her oxygen saturation is 98%.      General:  Well-nourished, well-developed, in no apparent distress  Head:  Normocephalic, atraumatic  Eyes:  Pupils equally round, extraocular muscles intact, sclareae non-icteric, conjunctivae clear  Neck:  Trachea midline, no thyromegaly or lymphadenopathy  Cardiovascular:  Regular rate and rhythm, no murmurs, rubs, gallops; no edema in lower extremities BL  Respiratory:  Clear to auscultation bilaterally, no wheezes, rhonchi, crackles  Abdominal:  Bowel sounds normal; abdomen soft, pain to palpation of left flank  Skin:  Warm and dry  Neurological:  Awake, A&O x 3. CNII-XII, strength and sensation grossly intact  Psychiatric:  Normal mood & affect, thought process linear, speech content normal  Data Reviewed and Interpretation:              Assessment:      Rachel [name] is a 81 y.o. 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, migraines who presents for UTI and possible COVID         Plan:     (N39.0) UTI (urinary tract infection)  (primary encounter diagnosis)  Plan: POCT Urine dipstick, URINE CULTURE, Basic         Metabolic Panel Non fasting, CBC/DIFF        Send UA/culture, dipstick positive for small leukocytes, neg nitrites  Patient has been on antibiotics twice in August, hold off for now and send culture  If recurrent will also recommend urogyn referral     (U07.1) COVID  Plan: RESPIRATORY VIRUS PANEL, CANCELED: COVID-19         SCREENING - SYMPTOMATIC (OP)    (R35.0) Urinary  frequency  Plan: Hemoglobin A1C  Previously prediabetic, check a1c    (R10.9) Abdominal pain, unspecified abdominal location  Plan: CBC/DIFF    Check CBC and Cr         Healthcare Maintenance:     N/a    Follow Up:       Return to clinic in 3 months    Total time: 23 mins  Patient had no questions regarding treatment plan, goals, risks or benefits and agrees to contact me or my clinic in the interim should any questions or problems arise.    [name], MD

## 2022-09-06 LAB — URINE CULTURE: URINE CULTURE: NO GROWTH

## 2022-09-11 ENCOUNTER — Other Ambulatory Visit: Payer: Self-pay

## 2022-09-11 ENCOUNTER — Ambulatory Visit: Payer: Medicare PPO | Attending: Family | Admitting: Family

## 2022-09-11 ENCOUNTER — Encounter (HOSPITAL_BASED_OUTPATIENT_CLINIC_OR_DEPARTMENT_OTHER): Payer: Self-pay | Admitting: Family

## 2022-09-11 VITALS — Temp 97.3°F | Ht 63.74 in | Wt 108.9 lb

## 2022-09-11 DIAGNOSIS — L821 Other seborrheic keratosis: Secondary | ICD-10-CM | POA: Insufficient documentation

## 2022-09-11 DIAGNOSIS — D1800 Hemangioma unspecified site: Secondary | ICD-10-CM | POA: Insufficient documentation

## 2022-09-11 DIAGNOSIS — Z1283 Encounter for screening for malignant neoplasm of skin: Secondary | ICD-10-CM | POA: Insufficient documentation

## 2022-09-11 NOTE — Progress Notes (Signed)
Dermatology Clinic, [facility]  [place] [address]  [phone]    Date:   09/11/2022  Name: Rachel [name]  Age: 81 y.o.  Last visit: 10/18/2021    Chief complaint: skin lesions     HPI  Rachel [name] is a 81 y.o. female with no personal/family history of skin cancer, presenting for concern(s) of bruising easily and multiple skin lesions. States she barely bumps skin and it bruises. Notes a history of sun exposure. Notes lesion on nose. States lesion has been present for several months and seems to be improving. Also notes an itchy lesion on right chest and left back. States lesions are asymptomatic.  Lastly notes a lesion on her left lower leg. States lesions will peel off and return. No other new, changing, bleeding, or symptomatic lesions or moles. No other skin-related complaints. She is having breast implant removal     Review of Systems   Constitutional:  Negative for chills and fever.   Skin:  Negative for itching and rash.          Current Medications  Outpatient Encounter Medications as of 09/11/2022   Medication Sig Dispense Refill    [EXPIRED] Cefpodoxime (VANTIN) 100 mg Oral Tablet Take 1 Tablet (100 mg total) by mouth Twice daily for 5 days Indications: urinary tract infection caused by Klebsiella bacteria, urinary tract infection due to E. coli bacteria 10 Tablet 0    diclofenac sodium (VOLTAREN) 75 mg Oral Tablet, Delayed Release (E.C.) Take 1 Tablet (75 mg total) by mouth Twice daily Indications: joint damage causing pain and loss of function 60 Tablet 0    DULoxetine (CYMBALTA DR) 60 mg Oral Capsule, Delayed Release(E.C.) Take 1 Capsule (60 mg total) by mouth Once a day 30 Capsule 3    esomeprazole magnesium (NEXIUM) 40 mg Oral Capsule, Delayed Release(E.C.) Take 1 Capsule (40 mg total) by mouth Every morning before breakfast Indications: gastroesophageal reflux disease 90 Capsule 3    fexofenadine (ALLEGRA) 180 mg Oral Tablet Take 1 Tablet  (180 mg total) by mouth Once a day 90 Tablet 4    fluticasone propionate (FLONASE) 50 mcg/actuation Nasal Spray, Suspension Administer 1 Spray into each nostril Twice daily 16 g 0    naloxone (NARCAN) 4 mg per spray nasal spray Instill 1 Spray by INTRANASAL route Every 2 minutes as needed for actual or suspected opioid overdose. Call 911 if used. 2 Each 0    Non-Adherent Bandage (TELFA) 3 X 8 " Bandage Apply Vaseline to wounds. Then, apply Telfa pad. Secure with gauze 144 Each 3    Oxycodone (ROXICODONE) 10 mg Oral Tablet Take 1 Tablet (10 mg total) by mouth Every 6 hours as needed for Pain for up to 30 days Indications: pain, osteoarthritis 120 Tablet 0    [EXPIRED] predniSONE (DELTASONE) 20 mg Oral Tablet Take 1 Tablet (20 mg total) by mouth Once a day for 7 days Indications: posion ivy 7 Tablet 0    rOPINIRole (REQUIP) 2 mg Oral Tablet Take 1 Tablet (2 mg total) by mouth Three times a day 90 Tablet 0    [DISCONTINUED] Cefpodoxime (VANTIN) 100 mg Oral Tablet Take 1 Tablet (100 mg total) by mouth Twice daily for 5 days Indications: urinary tract infection caused by Klebsiella bacteria, urinary tract infection due to E. coli bacteria 10 Tablet 0    [DISCONTINUED] diclofenac sodium (VOLTAREN) 75 mg Oral Tablet, Delayed Release (E.C.) Take 1 Tablet (75 mg total) by mouth  Twice daily Indications: joint damage causing pain and loss of function 60 Tablet 0    [DISCONTINUED] fexofenadine HCl (ALLEGRA ORAL) Take 180 mg by mouth Every morning      [DISCONTINUED] fluticasone propionate (FLONASE) 50 mcg/actuation Nasal Spray, Suspension Administer 1 Spray into each nostril Twice daily 16 g 0    [DISCONTINUED] fluticasone propionate (FLONASE) 50 mcg/actuation Nasal Spray, Suspension Administer 1 Spray into each nostril Twice daily 16 g 4    [DISCONTINUED] methocarbamoL (ROBAXIN) 500 mg Oral Tablet Take 1/4 of a pill nightly as needed for neck pain and headache (Patient not taking: Reported on 07/24/2022) 10 Tablet 0     [DISCONTINUED] Oxycodone (ROXICODONE) 10 mg Oral Tablet Take 1 Tablet (10 mg total) by mouth Every 6 hours as needed for Pain for up to 30 days Indications: pain, osteoarthritis 120 Tablet 0    [DISCONTINUED] rOPINIRole (REQUIP) 2 mg Oral Tablet Take 1 Tablet (2 mg total) by mouth Three times a day 90 Tablet 0     No facility-administered encounter medications on file as of 09/11/2022.        Allergies   Allergen Reactions    Latex     Darvon [Propoxyphene] Mental Status Effect     Hallucinations    Poison Ivy Extract     Shellfish Derived         Past Medical History:   Diagnosis Date    Chronic pain     Depression     GERD (gastroesophageal reflux disease)     Low back pain     Migraine     Neck problem     Osteoarthritis     Shortness of breath     Wears glasses             Physical Exam  Vitals:    Temp:  [36.3 C (97.3 F)] 36.3 C (97.3 F) (09/19 1025)   Vitals:    09/11/22 1025   Temp: 36.3 C (97.3 F)   Weight: 49.4 kg (108 lb 14.5 oz)   Height: 1.619 m (5' 3.74")        Physical Exam  Constitutional:       General: She is not in acute distress.     Appearance: Normal appearance.   HENT:      Head:     Eyes:      General: Lids are normal.   Skin:     General: Skin is warm and dry.      Coloration: Skin is not pale.          Neurological:      Mental Status: She is alert and oriented to person, place, and time. Mental status is at baseline.      Coordination: Coordination normal.   Psychiatric:         Mood and Affect: Mood normal.       General skin exam was performed including head, neck, anterior/posterior trunk, bilateral upper and lower extremities and revealed no areas of concern other than those documented.     Assessment and Plan    #Actinic purpura - chronic and worsening (see #1 on body diagram)  - Recommend gentle cleanser daily. Recommend Vaseline, Telfa, and gauze wrap for wound care daily. Rx Tefla. No evidence of infection today. Skin tears do not preclude her from surgery for implant removal  in my opinion.    - Recommend SPF 30 sunscreen and protective clothing when out.     #Seborrheic keratoses (see #  1 & #2 on body diagram) - new  -I discussed with the patient that a seborrheic keratosis is a common, benign epidermal lesion and that most patients develop at least one seborrheic keratosis in their lifetime.  The condition is usually asymptomatic, and unless disturbed, most seborrheic keratoses persist and grow slowly.  No intervention is indicated at this time.  I will continue to follow the patient.      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    This is a shared visit with Dr. Amedeo Gory.     I spent 15 minutes with patient     Monique Bandy, APRN,FNP-BC      TIME: 8 min    I personally saw and evaluated the patient. See mid-level's note for additional details. My findings/participation are spider angioma on nose; actinic purpura; continue to follow.    Dossie Der, MD

## 2022-09-12 ENCOUNTER — Ambulatory Visit (HOSPITAL_BASED_OUTPATIENT_CLINIC_OR_DEPARTMENT_OTHER): Payer: Self-pay | Admitting: Internal Medicine

## 2022-09-21 ENCOUNTER — Other Ambulatory Visit: Payer: Self-pay

## 2022-09-21 ENCOUNTER — Other Ambulatory Visit (HOSPITAL_BASED_OUTPATIENT_CLINIC_OR_DEPARTMENT_OTHER): Payer: Self-pay | Admitting: Internal Medicine

## 2022-09-21 ENCOUNTER — Inpatient Hospital Stay
Admission: RE | Admit: 2022-09-21 | Discharge: 2022-09-21 | Disposition: A | Discharge status: Home / Self Care | Payer: Medicare PPO | Source: Ambulatory Visit | Attending: PODIATRY | Admitting: PODIATRY

## 2022-09-21 DIAGNOSIS — M79676 Pain in unspecified toe(s): Secondary | ICD-10-CM

## 2022-09-21 DIAGNOSIS — G2581 Restless legs syndrome: Secondary | ICD-10-CM

## 2022-09-21 DIAGNOSIS — R252 Cramp and spasm: Secondary | ICD-10-CM

## 2022-09-24 MED ORDER — ROPINIROLE 2 MG TABLET
2.0000 mg | ORAL_TABLET | Freq: Three times a day (TID) | ORAL | 0 refills | Status: DC
Start: 2022-09-24 — End: 2022-12-31

## 2022-09-26 ENCOUNTER — Ambulatory Visit (HOSPITAL_BASED_OUTPATIENT_CLINIC_OR_DEPARTMENT_OTHER): Payer: Self-pay | Admitting: Internal Medicine

## 2022-09-26 ENCOUNTER — Other Ambulatory Visit (HOSPITAL_BASED_OUTPATIENT_CLINIC_OR_DEPARTMENT_OTHER): Payer: Self-pay | Admitting: Internal Medicine

## 2022-09-26 ENCOUNTER — Ambulatory Visit (INDEPENDENT_AMBULATORY_CARE_PROVIDER_SITE_OTHER): Payer: Self-pay | Admitting: Ophthalmology

## 2022-09-26 DIAGNOSIS — M1990 Unspecified osteoarthritis, unspecified site: Secondary | ICD-10-CM

## 2022-09-26 NOTE — Telephone Encounter (Signed)
Regarding: Rx refill  ----- Message from [place] sent at 09/26/2022  4:03 PM EDT -----   Doctor Name:   [name], MD         Date of last appointment:  09/05/22    Next scheduled visit:  03/22/23    Medication Requested:      Oxycodone (ROXICODONE) 10 mg Oral Tablet     Preferred [id number] - Dayna [facility], [place]   [address]    [place] [address]    Phone: [phone] Fax: [phone]    Hours: Not open 24 hours           Notes for Nurse or Physician:  the pt is out of Rx

## 2022-09-26 NOTE — Telephone Encounter (Signed)
Pended medication for review. Zemirah Krasinski Ferchalk, RN

## 2022-09-26 NOTE — Telephone Encounter (Signed)
Regarding: returning call  ----- Message from [place] sent at 09/26/2022  3:15 PM EDT -----  Dr. Renita Papa,      Pt returning a call for Dr. Renita Papa. No info left on what in regards to

## 2022-09-27 MED ORDER — OXYCODONE 10 MG TABLET
10.0000 mg | ORAL_TABLET | Freq: Four times a day (QID) | ORAL | 0 refills | Status: DC | PRN
Start: 2022-09-27 — End: 2022-11-07

## 2022-09-27 NOTE — Telephone Encounter (Signed)
Mehaffey, Apolonio Schneiders, MD  You; [name], MD7 minutes ago (8:29 AM)     I do not have any messages for this patient.  Please call back to see if there is something she needs - otherwise can close encounter.     [name], MD  You16 hours ago (4:00 PM)     I don't think she's ever seen her either, strange!        Patient active on MyChart. Sent MyChart message regarding this.  Kinnie Kaupp, RN

## 2022-09-28 ENCOUNTER — Ambulatory Visit: Payer: Medicare PPO | Attending: Ophthalmology | Admitting: Ophthalmology

## 2022-09-28 ENCOUNTER — Encounter (INDEPENDENT_AMBULATORY_CARE_PROVIDER_SITE_OTHER): Payer: Self-pay | Admitting: Ophthalmology

## 2022-09-28 ENCOUNTER — Other Ambulatory Visit: Payer: Self-pay

## 2022-09-28 DIAGNOSIS — Z961 Presence of intraocular lens: Secondary | ICD-10-CM | POA: Insufficient documentation

## 2022-09-28 DIAGNOSIS — H547 Unspecified visual loss: Secondary | ICD-10-CM | POA: Insufficient documentation

## 2022-09-28 DIAGNOSIS — H53453 Other localized visual field defect, bilateral: Secondary | ICD-10-CM | POA: Insufficient documentation

## 2022-09-28 DIAGNOSIS — H538 Other visual disturbances: Secondary | ICD-10-CM | POA: Insufficient documentation

## 2022-09-28 NOTE — Progress Notes (Addendum)
[name]  [place] [address]  Operated by [place]         Patient Name: Rachel [name]  MRN#: [id number]  Birthdate: [birth year]    Date of Service: 09/28/2022    Chief Complaint    Follow Up         NANDANA KROLIKOWSKI is a 81 y.o. female who presents today for evaluation/consultation of:  HPI       Follow Up     Additional comments: Pt here for f/u: Blurred vision/Dry eyes OU    Pt states she got new glasses and she cannot see well out of them.   Pt states va is unchanged.   Pt c/o dull eye pain this morning, usually does not, drops resolved.   Pt denies new/abnormal floater/fol.     Current eye meds: systane BID OU, AT daily OU          Last edited by Marylen Ponto., COA on 09/28/2022  3:29 PM.        ROS    Positive for: Eyes  Negative for: Constitutional, Gastrointestinal, Neurological, Skin, Genitourinary, Musculoskeletal, HENT, Endocrine, Cardiovascular, Respiratory, Psychiatric, Allergic/Imm, Heme/Lymph  Last edited by Marylen Ponto., COA on 09/28/2022  3:26 PM.         All other systems Negative    Haylee N. Glo Herring, [place]  09/28/2022, 15:30      Base Eye Exam       Visual Acuity (Snellen - Linear)         Right Left    Dist cc 20/40 -1 20/25 -1    Dist ph cc 20/30 -1       Correction: Glasses   Pt c/o diplopia cc OD               Tonometry (icare, 4:12 PM)         Right Left    Pressure 14 12              Pupils         Shape APD    Right Round None    Left Round None              Visual Fields         Right Left     Full Full              Extraocular Movement         Right Left     Full Full              Neuro/Psych       Oriented x3: Yes    Mood/Affect: Normal                  Strabismus Exam       Method: PACT    Correction: cc      Distance Near Near +3DS N Bifocals    E(T) 3 E(T)' flick      RH(T) 3 RH(T)' flick          0 0 0   0 0 0                       0  0  E(T) 3 0  0            RH(T) 3           0 0 0  0 0 0                 Sensorimotor Exam Report Interpreted by:   [place] Caster, [place]  09/28/2022,  15:53    Indication: Oculomotor concern or visual disturbance  Reliability: Good  Interpretation: small intermittent eso and right hyper    PAT: 2 BO - previously had 2 BO fresnel on, kept falling off        Slit Lamp and Fundus Exam       External Exam         Right Left    External Normal Normal              Slit Lamp Exam         Right Left    Lids/Lashes Normal Normal    Conjunctiva/Sclera White and quiet White and quiet    Cornea tr inf pee inf pee    Anterior Chamber Deep and quiet Deep and quiet    Iris Round and reactive Round and reactive    Lens multifocal toric, open PC multifocal toric, open PC    Anterior Vitreous pvd Normal              Fundus Exam         Right Left    Disc pink healthy pink healthy    C/D Ratio 0.3 0.3    Macula rare drusen rare drusen    Vessels Normal Normal    Periphery extramacular drusen extramacular drusen                  Refraction       Wearing Rx         Sphere Cylinder Axis Add    Right +0.25 +0.50 012 +2.00    Left -0.50 +1.75 009 +2.00      Type: PAL              Manifest Refraction (Auto)         Sphere Cylinder Axis Dist VA    Right -0.75 +1.00 012 20/25-1    Left -0.75 +0.75 167 20/40+2              Manifest Refraction #2         Sphere Cylinder Axis Dist VA    Right -1.25 +1.00 013 20/20    Left -1.00 +1.00 167 20/25              Manifest Refraction #3         Sphere Cylinder Axis Dist VA    Right [place] +0.75 019 20/20    Left -0.25 +1.50 003 20/25              Manifest Refraction Comments    Difficult refraction. Pt c/o diplopia on and off during refraction.    MR3 -TFM   -Very good refraction                            MD Addition to HPI: Pt is here for a f/u of blurred vision/Dry eyes OU. Pt states that she got new glasses and she cannot see well out of them. Pt states VA is unchanged. Pt c/o dull eye pain this morning, usually does not, drops resolved. Pt is using systane BID OU, AT daily  OU.     ENCOUNTER DIAGNOSES     ICD-10-CM   1. Blurred vision, bilateral  H53.8   2. Peripheral  vision loss, bilateral  H53.453   3. Visual impairment  H54.7   4. PEM Symfony toric IOL OU in 10/2019 in [place]  Z96.1     No orders of the defined types were placed in this encounter.      Ophthalmic Plan of Care:    1. Blurred vision OU  - VA 20/40 -1 OD // 20/25 -1 OS   - IOP 14/12  - Refracted today - Seems precise   - Plan for patient to come back in a couple weeks for a f/u refraction   - C/w AT's   - Monitor     Follow up:    I have asked Ellenore A Briere to follow up in 2-3 weeks for a repeat refraction or PRN          I have seen and examined the above patient. I discussed the above diagnoses listed in the assessment and the above ophthalmic plan of care with the patient and patient's family. All questions were answered. I reviewed and, when necessary, made changes to the technician/resident note, documented ophthalmology exam, chief complaint, history of present illness, allergies, review of systems, past medical, past surgical, family and social history. I personally reviewed and interpreted all testing and/or imaging performed at this visit and agree with the resident's or fellow's interpretation. Any exceptions/additions are edited/noted in the relevant encounter fields.    I am scribing for, and in the presence of, Dr. Nilsa Nutting for services provided on 09/28/2022.  Ronney Lion, SCRIBE   [place], [place]  09/28/2022, 16:26      Late entry for 09/28/22  When a scribe documentaion is present , I attest that I personally performed the services described in this documentation, as scribed  in my presence, and it is both accurate  and complete.  When a resident or fellow documentation is present, I attest that I saw and examined the patient.  I reviewed the resident's note.  I agree with the findings and plan of care as documented in the resident's note.  Any exceptions/additions are edited/noted.  When  performed, I also attest that I reviewed the image(s)  and testing(s)  and agree with the interpretation and report as documented by the resident/fellow.  Exceptions as noted.    Leonia Corona, MD,09/29/2022,06:47

## 2022-09-29 ENCOUNTER — Encounter (INDEPENDENT_AMBULATORY_CARE_PROVIDER_SITE_OTHER): Payer: Self-pay | Admitting: Ophthalmology

## 2022-10-15 ENCOUNTER — Other Ambulatory Visit: Payer: Self-pay

## 2022-10-15 ENCOUNTER — Ambulatory Visit: Payer: Medicare PPO | Attending: Ophthalmology | Admitting: Ophthalmology

## 2022-10-15 ENCOUNTER — Ambulatory Visit (HOSPITAL_BASED_OUTPATIENT_CLINIC_OR_DEPARTMENT_OTHER): Payer: Medicare PPO | Admitting: Internal Medicine

## 2022-10-15 ENCOUNTER — Encounter (INDEPENDENT_AMBULATORY_CARE_PROVIDER_SITE_OTHER): Payer: Self-pay | Admitting: Ophthalmology

## 2022-10-15 ENCOUNTER — Encounter (HOSPITAL_BASED_OUTPATIENT_CLINIC_OR_DEPARTMENT_OTHER): Payer: Self-pay | Admitting: Internal Medicine

## 2022-10-15 VITALS — BP 138/70 | HR 106 | Temp 98.1°F | Ht 63.47 in | Wt 108.9 lb

## 2022-10-15 DIAGNOSIS — R32 Unspecified urinary incontinence: Secondary | ICD-10-CM | POA: Insufficient documentation

## 2022-10-15 DIAGNOSIS — K5900 Constipation, unspecified: Secondary | ICD-10-CM | POA: Insufficient documentation

## 2022-10-15 DIAGNOSIS — H47019 Ischemic optic neuropathy, unspecified eye: Secondary | ICD-10-CM

## 2022-10-15 DIAGNOSIS — H53453 Other localized visual field defect, bilateral: Secondary | ICD-10-CM

## 2022-10-15 DIAGNOSIS — Z23 Encounter for immunization: Secondary | ICD-10-CM | POA: Insufficient documentation

## 2022-10-15 DIAGNOSIS — Z961 Presence of intraocular lens: Secondary | ICD-10-CM

## 2022-10-15 DIAGNOSIS — H5347 Heteronymous bilateral field defects: Secondary | ICD-10-CM

## 2022-10-15 DIAGNOSIS — H538 Other visual disturbances: Secondary | ICD-10-CM

## 2022-10-15 MED ORDER — DOCUSATE SODIUM 100 MG CAPSULE
100.0000 mg | ORAL_CAPSULE | Freq: Two times a day (BID) | ORAL | 0 refills | Status: AC
Start: 2022-10-15 — End: 2022-11-14

## 2022-10-15 MED ORDER — POLYETHYLENE GLYCOL 3350 17 GRAM ORAL POWDER PACKET
17.0000 g | Freq: Every day | ORAL | 0 refills | Status: DC
Start: 2022-10-15 — End: 2022-11-07

## 2022-10-15 NOTE — Progress Notes (Addendum)
[name]  [place] [address]  Operated by [facility]         Patient Name: MERARY GARGUILO  MRN#: [id number]  Birthdate: [birth year]    Date of Service: 10/15/2022    Chief Complaint    Blurred Vision         BINNIE DROESSLER is a 81 y.o. female who presents today for evaluation/consultation of:  HPI       Blurred Vision    In both eyes. Additional comments: Pt here for f/u.   Pt feels vision is worse  Pt states she has had diplopia several times over the past few wks, images up-down, and gone with the closing of an eye  Pt denies any flashes or floaters  Pt is photophobic  Pt uses Systane BID OU and nasal spray  Pt c/o eye burning          Last edited by Blima Rich, [place] on 10/15/2022  4:06 PM.        ROS    Positive for: Eyes  Negative for: Constitutional, Gastrointestinal, Neurological, Skin, Genitourinary, Musculoskeletal, HENT, Endocrine, Cardiovascular, Respiratory, Psychiatric, Allergic/Imm, Heme/Lymph  Last edited by Blima Rich, [place] on 10/15/2022  4:06 PM.         All other systems Negative    Blima Rich, [place]  97/41/6384, 16:08      Base Eye Exam       Visual Acuity (Snellen - Linear)         Right Left    Dist cc 20/20 -2 20/40 +2    Dist ph cc  NI      Correction: Glasses   Used gtts around 4 am today             Pupils         APD    Right None    Left None              Visual Fields (Counting fingers)         Right Left     Full Full              Extraocular Movement         Right Left     Full Full              Neuro/Psych       Oriented x3: Yes    Mood/Affect: Normal                  Slit Lamp and Fundus Exam       External Exam         Right Left    External Normal Normal              Slit Lamp Exam         Right Left    Lids/Lashes Normal Normal    Conjunctiva/Sclera White and quiet White and quiet    Cornea tr inf pee inf pee    Anterior Chamber Deep and quiet Deep and quiet    Iris Round and reactive Round and reactive    Lens  multifocal toric, open PC multifocal toric, open PC    Anterior Vitreous pvd Normal              Fundus Exam         Right Left    Disc pink healthy pink healthy    C/D Ratio 0.3  0.3    Macula rare drusen rare drusen    Vessels Normal Normal    Periphery extramacular drusen extramacular drusen                  Refraction       Wearing Rx         Sphere Cylinder Axis Add    Right +0.25 +0.50 012 +2.00    Left -0.50 +1.75 009 +2.00      Type: PAL              Manifest Refraction (Auto)         Sphere Cylinder Axis Dist VA Add Near [place]    Right -0.75 +1.00 010       Left -0.75 +1.50 158                 Manifest Refraction #2         Sphere Cylinder Axis Dist VA Add Near [place]    Right [place] +1.00 015 20/20 +2.50 20/20    Left -0.50 +1.75 180 20/20 slow +2.50 20/20              Manifest Refraction #3         Sphere Cylinder Axis Dist VA Add Near [place]    Right [place] +0.75 019 20/15 +2.75     Left -0.50 +1.50 003 20/25 +2.75               Final Rx         Sphere Cylinder Axis Dist VA Add    Right [place] +0.75 019 20/15 +2.75    Left -0.50 +1.50 003 20/25 +2.75      Type: PAL                            MD Addition to HPI: Pt is here for a f/u, pt feels vision is worse. Pt states she had diplopia several times over the past few weeks. Pt images up and down, and gone with the closing of an eye. Pt reports photophobia. Pt c/o eye burning. Pt is using systane BID OU and nasal spray.     ENCOUNTER DIAGNOSES     ICD-10-CM   1. Blurred vision, bilateral  H53.8   2. Peripheral vision loss, bilateral  H53.453   3. PEM Symfony toric IOL OU in 10/2019 in [place]  Z96.1   4. 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      Ophthalmic Plan of Care:    1. Blurred vision OU  - VA 20/20 -2 OD // 20/40 +2 OS   - Refracted at previous visit - Seemed precise   - Refracted today - New Mrx Provided today    - C/w AT's   - Monitor     Follow up:    I have asked Dorothea A Gertsch to follow up in 6 months or PRN          I have seen and examined the above patient. I discussed the above diagnoses listed in the assessment and the above ophthalmic plan of care with the patient and patient's family. All questions were answered. I reviewed and, when necessary, made changes to the technician/resident note, documented ophthalmology exam, chief complaint, history of present illness, allergies,  review of systems, past medical, past surgical, family and social history. I personally reviewed and interpreted all testing and/or imaging performed at this visit and agree with the resident's or fellow's interpretation. Any exceptions/additions are edited/noted in the relevant encounter fields.    I am scribing for, and in the presence of, Dr. Nilsa Nutting for services provided on 10/15/2022.  Ronney Lion, SCRIBE   [place], SCRIBE  10/15/2022, 16:21      Late entry for 10/15/22  When a scribe documentaion is present , I attest that I personally performed the services described in this documentation, as scribed  in my presence, and it is both accurate  and complete.  When a resident or fellow documentation is present, I attest that I saw and examined the patient.  I reviewed the resident's note.  I agree with the findings and plan of care as documented in the resident's note.  Any exceptions/additions are edited/noted.  When performed, I also attest that I reviewed the image(s)  and testing(s)  and agree with the interpretation and report as documented by the resident/fellow.  Exceptions as noted.    Leonia Corona, MD,10/16/2022,05:49

## 2022-10-15 NOTE — Nursing Note (Signed)
1. Are you 80 years of age or older? yes  2. Have you ever had a severe reaction to a flu shot? no  3. Are you allergic to eggs? no  4. Are you allergic to latex? no  5. Are you allergic to Thimerosol? no  6. Are you experiencing acute illness symptoms or have you been running a fever? no  7. Do you have a medical condition or taking medications that suppress your immune system? no  8. Have you ever had Guillain-Barre syndrome or other neurologic disorder? no  9. Are you pregnant or breastfeeding? no    Immunization administered       Name Date Dose VIS Date Route    Covid-19 Vaccine,Moderna(Spikevax),38mg/0.5mL(2023-2024)  Deferred (Other "See Comments") -- -- --    Comment: Do not have Moderna Covid vaccine    Influenza Vaccine, 65+ 10/15/2022 0.5 mL 07/29/2020 Intramuscular    Site: Left deltoid    Given By: SKela Millin MA    Manufacturer: [employer]    Lot: [id number]   [place] [phone]           EKela Millin MA 10/15/2022, 09:35

## 2022-10-15 NOTE — Progress Notes (Signed)
INTERNAL MEDICINE   [facility]  Return Visit Progress Note     Name: Rachel [name] Date of Service: 10/15/2022   MRN:  [id number]  Age/DOB: 81 y.o., [birth year] PCP:  [name], MD  Reason for Visit: Follow Up 6 Months and Urinary Urgency     Subjective:      Rachel [name] is a 81 y.o. 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    Going to the bathroom every hour, leaking and burning, wearing underpads  Using azo only in the morning, no more than twice a day  Would like to see urogyn and pelvic floor PT - working with [facility]  Recent UA was negative  She says Nexium makes her sleepy and isn't working for her but she tells me her heartburn symptoms are in her bladder or around her navel  Says her stool is black and pellets and she has been very constipated and she uses dulcolax which gives her cramping    Sleep  She received a green light, loves it but is getting up frequently to use the bathroom so that keeps her up and makes her tired during the day    Flu shot & covid booster today    She's using CBD for her joints which helps along with the oxycodone, she says these two are changing her life and helping so much  She started knitting again and has been walking 3-5 miles a week     Medical History:      Past Medical History, Allergies, Surgical History, Family, and Social History were reviewed  on   10/15/2022 and updated as needed.     MEDICATIONS:  Outpatient Medications Marked as Taking for the 10/15/22 encounter (Office Visit) with [name], MD   Medication Sig    diclofenac sodium (VOLTAREN) 75 mg Oral Tablet, Delayed Release (E.C.) Take 1 Tablet (75 mg total) by mouth Twice daily Indications: joint damage causing pain and loss of function    docusate sodium (COLACE) 100 mg Oral Capsule Take 1 Capsule (100 mg total) by mouth Twice daily for 30 days Indications: constipation    DULoxetine (CYMBALTA DR) 60 mg Oral Capsule,  Delayed Release(E.C.) Take 1 Capsule (60 mg total) by mouth Once a day    fexofenadine (ALLEGRA) 180 mg Oral Tablet Take 1 Tablet (180 mg total) by mouth Once a day    fluticasone propionate (FLONASE) 50 mcg/actuation Nasal Spray, Suspension Administer 1 Spray into each nostril Twice daily    Non-Adherent Bandage (TELFA) 3 X 8 " Bandage Apply Vaseline to wounds. Then, apply Telfa pad. Secure with gauze    polyethylene glycol (MIRALAX) 17 gram Oral Powder in Packet Take 1 Packet (17 g total) by mouth Once a day Indications: constipation    rOPINIRole (REQUIP) 2 mg Oral Tablet Take 1 Tablet (2 mg total) by mouth Three times a day       Review of Systems:      Other than stated above all other ROS are negative.      Physical Exam:     Vitals:   height is 1.612 m (5' 3.47") and weight is 49.4 kg (108 lb 14.5 oz). Her temporal temperature is 36.7 C (98.1 F). Her blood pressure is 138/70 and her pulse is 106 (abnormal). Her oxygen saturation is 97%.      General:  Well-nourished, well-developed, in no apparent distress  Head:  Normocephalic,  atraumatic  Eyes:  Pupils equally round, extraocular muscles intact, sclareae non-icteric, conjunctivae clear  Neck:  Trachea midline, no thyromegaly or lymphadenopathy  Cardiovascular:  Regular rate and rhythm, no murmurs, rubs, gallops; no edema in lower extremities BL  Respiratory:  Clear to auscultation bilaterally, no wheezes, rhonchi, crackles  Abdominal:  TTP LLQ and around her navel  Skin:  Warm and dry  Neurological:  Awake, A&O x 3. CNII-XII, strength and sensation grossly intact  Psychiatric:  Normal mood & affect, thought process linear, speech content normal       Data Reviewed and Interpretation:     A1C: 5.7  A1C Date: 09/05/2022  COMPLETE BLOOD COUNT   Lab Results   Component Value Date    WBC 5.0 09/05/2022    HGB 10.4 (L) 09/05/2022    HCT 31.7 (L) 09/05/2022    PLTCNT 216 09/05/2022       DIFFERENTIAL  Lab Results   Component Value Date    PMNS 66 09/05/2022     MONOCYTES 8 09/05/2022    BASOPHILS 0 09/05/2022    BASOPHILS <0.10 09/05/2022    PMNABS 3.30 09/05/2022    LYMPHSABS 0.91 (L) 09/05/2022    EOSABS 0.39 09/05/2022    MONOSABS 0.40 09/05/2022     BASIC METABOLIC PANEL  Lab Results   Component Value Date    SODIUM 138 09/05/2022    POTASSIUM 4.4 09/05/2022    CHLORIDE 102 09/05/2022    CO2 29 09/05/2022    ANIONGAP 7 09/05/2022    BUN 25 09/05/2022    CREATININE 0.81 09/05/2022    BUNCRRATIO 31 (H) 09/05/2022    GFR 73 09/05/2022    CALCIUM 9.0 09/05/2022    GLUCOSENF 116 09/05/2022      COMPREHENSIVE METABOLIC PANEL FASTING  Lab Results   Component Value Date    SODIUM 138 09/05/2022    POTASSIUM 4.4 09/05/2022    CHLORIDE 102 09/05/2022    CO2 29 09/05/2022    ANIONGAP 7 09/05/2022    BUN 25 09/05/2022    CREATININE 0.81 09/05/2022    CALCIUM 9.0 09/05/2022    ALBUMIN 3.9 12/27/2021    TOTALPROTEIN 7.0 12/27/2021    ALKPHOS 67 12/27/2021    AST 24 12/27/2021    ALT 15 12/27/2021     COMPREHENSIVE METABOLIC PANEL   Lab Results   Component Value Date    SODIUM 138 09/05/2022    POTASSIUM 4.4 09/05/2022    CHLORIDE 102 09/05/2022    CO2 29 09/05/2022    ANIONGAP 7 09/05/2022    BUN 25 09/05/2022    CREATININE 0.81 09/05/2022    GLUCOSENF 116 09/05/2022    CALCIUM 9.0 09/05/2022    ALBUMIN 3.9 12/27/2021    TOTALPROTEIN 7.0 12/27/2021    ALKPHOS 67 12/27/2021    AST 24 12/27/2021    ALT 15 12/27/2021         LIPID PROFILE  Lab Results   Component Value Date    CHOLESTEROL 230 (H) 09/13/2021    HDLCHOL 72 09/13/2021    LDLCHOL 125 (H) 09/13/2021    TRIG 167 (H) 09/13/2021       THYROID STIMULATING HORMONE  No results found for: "TSH", "[id number]"  VITAMIN B12  Lab Results   Component Value Date    VITB12 324 02/06/2022     VITAMIN D 25-HYDROXY  Lab Results   Component Value Date    VITD25 58 09/13/2021  Assessment:      Rachel [name] is a 81 y.o. 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           Plan:     (R32) Incontinence  (primary encounter diagnosis)  Plan: Refer to Urogynecology        Suspect could be worsened by constipation, start bowel regimen and re eval  [facility] with [facility] which she is looking into  Will avoid anticholinergics d/t frequent dizziness and h/o recurrent UTI's    (K59.00) Constipation, unspecified constipation type  Plan: docusate sodium (COLACE) 100 mg Oral Capsule,         polyethylene glycol (MIRALAX) 17 gram Oral         Powder in Packet        Start colace BID, Miralax  Discussed taking opiates is likely the etiology  Not taking any other iron supplements  Stop dulcolax         Healthcare Maintenance:     (R32) Incontinence  (primary encounter diagnosis)  Plan: Refer to Urogynecology        Start bowel regimen  Pelvic floor PT referral    (K59.00) Constipation, unspecified constipation type  Plan: docusate sodium (COLACE) 100 mg Oral Capsule,         polyethylene glycol (MIRALAX) 17 gram Oral         Powder in Packet        Follow Up:       Return to clinic in 3 months      Patient had no questions regarding treatment plan, goals, risks or benefits and agrees to contact me or my clinic in the interim should any questions or problems arise.    [name], MD

## 2022-10-16 ENCOUNTER — Ambulatory Visit: Payer: Medicare PPO | Attending: OBSTETRICS/GYNECOLOGY | Admitting: OBSTETRICS/GYNECOLOGY

## 2022-10-16 ENCOUNTER — Encounter (INDEPENDENT_AMBULATORY_CARE_PROVIDER_SITE_OTHER): Payer: Self-pay | Admitting: Ophthalmology

## 2022-10-16 ENCOUNTER — Encounter (HOSPITAL_BASED_OUTPATIENT_CLINIC_OR_DEPARTMENT_OTHER): Payer: Self-pay | Admitting: OBSTETRICS/GYNECOLOGY

## 2022-10-16 VITALS — BP 130/55 | HR 91 | Temp 97.0°F | Ht 63.5 in | Wt 108.0 lb

## 2022-10-16 DIAGNOSIS — N951 Menopausal and female climacteric states: Secondary | ICD-10-CM | POA: Insufficient documentation

## 2022-10-16 DIAGNOSIS — N3941 Urge incontinence: Secondary | ICD-10-CM | POA: Insufficient documentation

## 2022-10-16 DIAGNOSIS — R32 Unspecified urinary incontinence: Secondary | ICD-10-CM | POA: Insufficient documentation

## 2022-10-16 MED ORDER — ESTRADIOL 0.05 MG-NORETHINDRONE 0.14 MG/24 HR SEMIWKLY TRANSDERM PATCH
1.0000 | MEDICATED_PATCH | TRANSDERMAL | 2 refills | Status: DC
Start: 2022-10-18 — End: 2022-10-18

## 2022-10-16 MED ORDER — MIRABEGRON ER 25 MG TABLET,EXTENDED RELEASE 24 HR
25.0000 mg | ORAL_TABLET | Freq: Every day | ORAL | 2 refills | Status: DC
Start: 2022-10-16 — End: 2022-11-27

## 2022-10-16 NOTE — Nursing Note (Signed)
Incontinence     History of present illness:  The patient is 81 year old female who complains of urinary incontinence.  Her symptoms are as follows:    Urinary frequency: Every hour  Nocturia: Every hour at night, keeps her up at night.   Force of stream: No  Stress incontinence: No  Urge incontinence: Yes  Her pelvic pain is described as: Yes  Her current medications include: Voltaren, colace, Cymbalta, allegra, Flonase, Miralax.  Pads used/day: Soaking at least 3 pairs of pants and 5 pads   Her prior treatments included: No

## 2022-10-16 NOTE — Progress Notes (Signed)
Chief Complaint   Patient presents with    Incontinence     History of present illness:  The patient is 81 y.o. female who complains of urge urinary incontinence. She reports urinary leaking for the past 4 months, which has worsened over time. The patient reports experiencing pelvic pain and has utilized zinc to combat soreness. She denies recent urinary tract infections, as well as any bulging sensation. The patient denies stress urinary incontinence. The patient reports utilizing Prempro beginning at the age of 57 and has been off Prempro for the past year, but would like to resume. She reports that her arthritis has worsened since discontinuing Prempro. She reports a history of high blood pressure and no history of hysterectomy.    Past Medical History:   Diagnosis Date    Chronic pain     Depression     GERD (gastroesophageal reflux disease)     Low back pain     Migraine     Neck problem     Osteoarthritis     Shortness of breath     Wears glasses            Past Surgical History:   Procedure Laterality Date    ANTERIOR FUSION CERVICAL SPINE  12/30/2020    BREAST IMPLANT REMOVAL Bilateral     2022    FOOT SURGERY Bilateral     HX BREAST AUGMENTATION Bilateral     x3, 1974, 1994, 2014    HX CATARACT REMOVAL Bilateral 01/12/2021    HX CERVICAL SPINE SURGERY      HX ROTATOR CUFF REPAIR Right     SACROILIAC JOINT INJECTION      TOE AMPUTATION             Current Outpatient Medications   Medication Sig    diclofenac sodium (VOLTAREN) 75 mg Oral Tablet, Delayed Release (E.C.) Take 1 Tablet (75 mg total) by mouth Twice daily Indications: joint damage causing pain and loss of function    docusate sodium (COLACE) 100 mg Oral Capsule Take 1 Capsule (100 mg total) by mouth Twice daily for 30 days Indications: constipation    DULoxetine (CYMBALTA DR) 60 mg Oral Capsule, Delayed Release(E.C.) Take 1 Capsule (60 mg total) by mouth Once a day    [START ON 10/18/2022] estradiol-norethindrone (COMBIPATCH) 0.05-0.14 mg/24 hr  Transdermal Patch Semiweekly Place 1 Patch on the skin Every MON and THURS    fexofenadine (ALLEGRA) 180 mg Oral Tablet Take 1 Tablet (180 mg total) by mouth Once a day    fluticasone propionate (FLONASE) 50 mcg/actuation Nasal Spray, Suspension Administer 1 Spray into each nostril Twice daily    mirabegron (MYRBETRIQ) 25 mg Oral Tablet Sustained Release 24 hr Take 1 Tablet (25 mg total) by mouth Once a day    naloxone (NARCAN) 4 mg per spray nasal spray Instill 1 Spray by INTRANASAL route Every 2 minutes as needed for actual or suspected opioid overdose. Call 911 if used.    Non-Adherent Bandage (TELFA) 3 X 8 " Bandage Apply Vaseline to wounds. Then, apply Telfa pad. Secure with gauze    Oxycodone (ROXICODONE) 10 mg Oral Tablet Take 1 Tablet (10 mg total) by mouth Every 6 hours as needed for Pain for up to 30 days Indications: pain, osteoarthritis    polyethylene glycol (MIRALAX) 17 gram Oral Powder in Packet Take 1 Packet (17 g total) by mouth Once a day Indications: constipation    rOPINIRole (REQUIP) 2 mg Oral Tablet Take 1 Tablet (  2 mg total) by mouth Three times a day       Allergies   Allergen Reactions    Latex     Darvon [Propoxyphene] Mental Status Effect     Hallucinations    Poison Ivy Extract     Shellfish Derived        Social History     Socioeconomic History    Marital status: Married     Spouse name: Interior and spatial designer    Number of children: 2    Years of education: Not on file    Highest education level: Not on file   Occupational History    Occupation: Retired   Tobacco Use    Smoking status: Never    Smokeless tobacco: Never   Vaping Use    Vaping Use: Never used   Substance and Sexual Activity    Alcohol use: Not Currently    Drug use: Not Currently     Frequency: 5.0 times per week     Types: Marijuana     Comment: medical marijuana gel    Sexual activity: Not Currently     Partners: Male   Other Topics Concern    Ability to Walk 1 Flight of Steps without SOB/CP Yes    Routine Exercise No    Ability to Walk  2 Flight of Steps without SOB/CP Not Asked    Unable to Ambulate Not Asked    Total Care Not Asked    Ability To Do Own ADL's Yes    Uses Walker No    Other Activity Level Yes     Comment: light house work    Electronics engineer Yes   Social History Narrative    Right handed; married for 91 years; two daughters (fraternal twins).  Two grandson's and one adopted grandson.      Originally from [place], [place].  Previously lived in [place] for [place] years ([place]) prior to coming back to [place] in late 2022.     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         Twins are to her first husband (was married for 3 years).         Enjoyed needlepoint, knitting, golf, tennis, gardening, cooking (does not do any of them now)        Daughter's both live in [place], [place].          Social Determinants of Company secretary Strain: Not on file   Transportation Needs: Not on file   Social Connections: Not on file   Intimate Partner Violence: Not on file   Housing Stability: Not on file       Family Medical History:       Problem Relation (Age of Onset)    Arthritis-osteo Sister    Food Allergy Mother, Sister    No Known Problems Father, Maternal Grandfather, Paternal 36, Paternal Grandfather, Daughter    Pneumonia Mother, Maternal Grandmother    Psoriasis Daughter              REVIEW OF SYSTEMS  All 10 systems were reviewed.  There were pertinent positives in: urge urinary incontinence, pelvic pain.  All other systems are negative.    OR    Negative for constitutional symptoms, ENT, cardiovascular, respiratory, gastrointestinal; musculoskeletal, skin and/or breasts, neurological, psychiatric, endocrine, hematologic, lymphatic, and allergic immunologic.    PHYSICAL EXAMINATION:  Well-developed, well-nourished female in no apparent distress.  Neck is supple  Respiratory examination reveals no respiratory distress and comfortable breathing on room  air.  Cardiac examination reveals extremities well-perfused.  Abdomen is free of masses, tenderness or hepatosplenomegaly.  Female pelvic examination was performed in the presence of a chaperone:   No significant prolapse noted   Urethral hypermobility: <30 degrees .   Anterior vaginal wall tenderness: none .   SUI in lithotomy: negative .   Mild vaginal atrophy   No adnexal masses palpated  Skin is normal  Musculoskeletal and neurological examinations are within normal limits    Procedures:  OB/GYN, [facility]  [place]  [place] [address]  Operated by [facility]  Procedure Note    Name: CAMBREY LUPI MRN:  [id number]   Date: 10/16/2022 Age: 81 y.o.  DOB:   1941/08/04       Ultrasound Measuring PVR    Performed by: Brunetta Jeans, MD  Authorized by: Brunetta Jeans, MD    Documentation:      PVR:  0 mL      Brunetta Jeans, MD      ASSESSMENT: Likely Urinary Incontinence:  Urge Type                              Menopausal symptoms    PLAN:   It was explained to the patient that use of hormonal therapy is thought to increase the risk of breast cancer, heart attacks, strokes, and blood clots, although the actual incidence of a such an event remains quite low.  The dose, form and regimen of hormonal medication were determined based on the patient's requirements, age and reason for therapy.    The benefits and alternatives to hormonal medication were reviewed with the patient.  The patient expressed an understanding and all questions were answered prior initiating treatment.   Prescriptions filled:   Medication: estradiol-norethinrone.   Dosage: 0.05-0.14 mg transdermal patch, 2x weekly .  Prescriptions filled:   Medication: MYRBETRIQ .   Dosage: 25 mg once daily.  The patient was advised of common side effects of bladder medication that include but are not necessarily limited to:  High blood pressure, angioedema (swelling), headache, and urinary retention.  She was  advised to stop the medication and notify our office immediately should she develop any of these side effects.    F/u in 3 months  Patient knows to call the office or the [facility] if they have any other problems in the interim.  Urology scheduler to call patient with date and time of upcoming procedures.    Return in about 3 months (around 01/16/2023) for In Person Visit.     I am scribing for, and in the presence of, Dr. Benjaman Lobe, MD, for services provided on 10/16/2022.    I personally performed the services described in this documentation, as scribed  in my presence, and it is both accurate  and complete.    Brunetta Jeans, MD

## 2022-10-16 NOTE — Procedures (Signed)
OB/GYN, [place] [address]  Operated by [facility]  Procedure Note    Name: Rachel [name] MRN:  [id number]   Date: 10/16/2022 Age: 81 y.o.  DOB:   1941/08/15       Ultrasound Measuring PVR    Performed by: Brunetta Jeans, MD  Authorized by: Brunetta Jeans, MD    Documentation:      PVR:  0 mL      Brunetta Jeans, MD

## 2022-10-18 ENCOUNTER — Ambulatory Visit (INDEPENDENT_AMBULATORY_CARE_PROVIDER_SITE_OTHER): Payer: Self-pay | Admitting: Ophthalmology

## 2022-10-18 ENCOUNTER — Ambulatory Visit (HOSPITAL_BASED_OUTPATIENT_CLINIC_OR_DEPARTMENT_OTHER): Payer: Self-pay | Admitting: OBSTETRICS/GYNECOLOGY

## 2022-10-18 ENCOUNTER — Other Ambulatory Visit (HOSPITAL_COMMUNITY): Payer: Self-pay | Admitting: OBSTETRICS/GYNECOLOGY

## 2022-10-18 MED ORDER — NORETHINDRONE ACETATE 0.5 MG-ETHINYL ESTRADIOL 2.5 MCG TABLET
1.0000 | ORAL_TABLET | Freq: Every day | ORAL | 3 refills | Status: DC
Start: 2022-10-18 — End: 2022-11-27

## 2022-10-18 NOTE — Telephone Encounter (Signed)
Faxed glasses rx. Rachel [name] Sailor Hevia, RN

## 2022-10-18 NOTE — Telephone Encounter (Signed)
Regarding: [place]  ----- Message from [place] sent at 10/18/2022 11:48 AM EDT -----  [facility] calling and they wanted dot see if they can get the eye glass script faxed over to hem [phone]. Thank you!!

## 2022-10-18 NOTE — Telephone Encounter (Addendum)
Tried calling patient. Voicemail was left that a new prescription was sent to her pharmacy.  Callback number was left.  Courtney Heys, RN  ----- Message from Brunetta Jeans, MD sent at 10/18/2022  4:00 PM EDT -----  ----- Message from Leslie Andrea sent at 10/18/2022 12:09 PM EDT -----  Gershon Crane pt    Pt was given the following: estradiol-norethindrone (COMBIPATCH) 0.05-0.14 mg/24 hr Transdermal Patch [employer] will not cover it, unless there is something that can be done on your end.  She is wanting to know what her next step will be.  Will there be  a pill prescribed? Or will you try getting insurance to cover it  Please advise pt.          Thanks

## 2022-10-24 ENCOUNTER — Encounter (HOSPITAL_BASED_OUTPATIENT_CLINIC_OR_DEPARTMENT_OTHER): Payer: Self-pay | Admitting: PHYSICIAN ASSISTANT

## 2022-10-24 ENCOUNTER — Other Ambulatory Visit: Payer: Self-pay

## 2022-10-24 ENCOUNTER — Other Ambulatory Visit (HOSPITAL_BASED_OUTPATIENT_CLINIC_OR_DEPARTMENT_OTHER): Payer: Medicare PPO

## 2022-10-24 ENCOUNTER — Ambulatory Visit: Payer: Medicare PPO | Attending: PHYSICIAN ASSISTANT | Admitting: PHYSICIAN ASSISTANT

## 2022-10-24 ENCOUNTER — Inpatient Hospital Stay (HOSPITAL_BASED_OUTPATIENT_CLINIC_OR_DEPARTMENT_OTHER)
Admission: RE | Admit: 2022-10-24 | Discharge: 2022-10-24 | Disposition: A | Discharge status: Home / Self Care | Payer: Medicare PPO | Source: Ambulatory Visit

## 2022-10-24 VITALS — BP 142/82 | HR 85 | Temp 98.3°F

## 2022-10-24 DIAGNOSIS — R300 Dysuria: Secondary | ICD-10-CM | POA: Insufficient documentation

## 2022-10-24 DIAGNOSIS — R0609 Other forms of dyspnea: Secondary | ICD-10-CM

## 2022-10-24 DIAGNOSIS — R0981 Nasal congestion: Secondary | ICD-10-CM | POA: Insufficient documentation

## 2022-10-24 LAB — CBC WITH DIFF
BASOPHIL #: <0.1 10*3/uL (ref <=0.20)
BASOPHIL %: 1 %
EOSINOPHIL #: 0.23 10*3/uL (ref <=0.50)
EOSINOPHIL %: 5 %
HCT: 34.5 % — ABNORMAL LOW (ref 34.8–46.0)
HGB: 11.1 g/dL — ABNORMAL LOW (ref 11.5–16.0)
IMMATURE GRANULOCYTE #: <0.1 10*3/uL (ref <0.10)
IMMATURE GRANULOCYTE %: 0 % (ref 0–1)
LYMPHOCYTE #: 0.91 10*3/uL — ABNORMAL LOW (ref 1.00–4.80)
LYMPHOCYTE %: 18 %
MCH: 29.6 pg (ref 26.0–32.0)
MCHC: 32.2 g/dL (ref 31.0–35.5)
MCV: 92 fL (ref 78.0–100.0)
MONOCYTE #: 0.3 10*3/uL (ref 0.20–1.10)
MONOCYTE %: 6 %
MPV: 9.5 fL (ref 8.7–12.5)
NEUTROPHIL #: 3.48 10*3/uL (ref 1.50–7.70)
NEUTROPHIL %: 70 %
PLATELETS: 191 10*3/uL (ref 150–400)
RBC: 3.75 10*6/uL — ABNORMAL LOW (ref 3.85–5.22)
RDW-CV: 14.5 % (ref 11.5–15.5)
WBC: 5 10*3/uL (ref 3.7–11.0)

## 2022-10-24 LAB — BASIC METABOLIC PANEL
ANION GAP: 3 mmol/L — ABNORMAL LOW (ref 4–13)
BUN/CREA RATIO: 25 — ABNORMAL HIGH (ref 6–22)
BUN: 17 mg/dL (ref 8–25)
CALCIUM: 9.3 mg/dL (ref 8.6–10.3)
CHLORIDE: 102 mmol/L (ref 96–111)
CO2 TOTAL: 31 mmol/L (ref 23–31)
CREATININE: 0.68 mg/dL (ref 0.60–1.05)
ESTIMATED GFR - FEMALE: 88 mL/min/BSA (ref >=60)
GLUCOSE: 99 mg/dL (ref 65–125)
POTASSIUM: 4.3 mmol/L (ref 3.5–5.1)
SODIUM: 136 mmol/L (ref 136–145)

## 2022-10-24 LAB — URINALYSIS, MICROSCOPIC
RBCS: 3 /hpf (ref <6.0)
WBCS: 56 /hpf — ABNORMAL HIGH (ref <11.0)

## 2022-10-24 LAB — URINALYSIS, MACROSCOPIC
BILIRUBIN: NEGATIVE mg/dL
COLOR: NORMAL
GLUCOSE: NEGATIVE mg/dL
KETONES: NEGATIVE mg/dL
NITRITE: NEGATIVE
PH: 8 (ref 5.0–8.0)
PROTEIN: NEGATIVE mg/dL
SPECIFIC GRAVITY: 1.01 (ref 1.005–1.030)
UROBILINOGEN: NEGATIVE mg/dL

## 2022-10-24 LAB — B-TYPE NATRIURETIC PEPTIDE (BNP),PLASMA: BNP: 187 pg/mL — ABNORMAL HIGH (ref <=100)

## 2022-10-24 NOTE — Progress Notes (Signed)
INTERNAL MEDICINE   Medical Group Practice at [facility]  Acute Visit Progress Note     Name: Rachel [name] Date of Service: 10/24/2022   MRN:  [id number]  Age/DOB: 81 y.o., 11/07/1941 PCP:  [name], MD  Reason for Visit: Shortness of Breath and Pain on Urination     Subjective:      Rachel [name] is a 81 y.o. 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    Patient presents with SOB with exertion started 2 days ago. Denies cough, sore throat, fever, chills. Has a lot of mucous and pain in her frontal sinuses. Having some headaches  States she has sinus infections all the time. Takes OTC sinus medicine, mucinex, allegra. Had normal TTE in 01/2021. Denies leg swelling. Today feels SOB at rest. No wheezing. No asthma, lung disease, smoking history. Denies sharp or radiating chest pain. Has pain in the chest where her prior breast implants were removed.     Having burning with urination, frequency for 3 months which has worsened. Has some suprapubic tenderness. No visible hematuria. Started taking AZO 1 month ago and took a dose last night. Has not been drinking a lot of water. Had seen her PCP on 10/23 in which she was having issues with constipation and was prescribed Miralax and Colace. States she then had diarrhea and stopped her bowel regimen. Has since seen Urogyn as well and was referred to pelvic PT. Urogyn started her on Myrbetriq and estradiol-norethindrone patch.         Medical History:      Past Medical History, Allergies, Surgical History, Family, and Social History were reviewed  on   10/24/2022 and updated as needed.     MEDICATIONS:  Outpatient Medications Marked as Taking for the 10/24/22 encounter (Office Visit) with Loura Back, PA-C   Medication Sig    diclofenac sodium (VOLTAREN) 75 mg Oral Tablet, Delayed Release (E.C.) Take 1 Tablet (75 mg total) by mouth Twice daily Indications: joint damage  causing pain and loss of function    docusate sodium (COLACE) 100 mg Oral Capsule Take 1 Capsule (100 mg total) by mouth Twice daily for 30 days Indications: constipation    DULoxetine (CYMBALTA DR) 60 mg Oral Capsule, Delayed Release(E.C.) Take 1 Capsule (60 mg total) by mouth Once a day    fexofenadine (ALLEGRA) 180 mg Oral Tablet Take 1 Tablet (180 mg total) by mouth Once a day    fluticasone propionate (FLONASE) 50 mcg/actuation Nasal Spray, Suspension Administer 1 Spray into each nostril Twice daily    mirabegron (MYRBETRIQ) 25 mg Oral Tablet Sustained Release 24 hr Take 1 Tablet (25 mg total) by mouth Once a day    naloxone (NARCAN) 4 mg per spray nasal spray Instill 1 Spray by INTRANASAL route Every 2 minutes as needed for actual or suspected opioid overdose. Call 911 if used.    Non-Adherent Bandage (TELFA) 3 X 8 " Bandage Apply Vaseline to wounds. Then, apply Telfa pad. Secure with gauze    Norethindrone Ac-Eth Estradiol 0.5-2.5 mg-mcg Oral Tablet Take 1 Tablet by mouth Once a day    Oxycodone (ROXICODONE) 10 mg Oral Tablet Take 1 Tablet (10 mg total) by mouth Every 6 hours as needed for Pain for up to 30 days Indications: pain, osteoarthritis    polyethylene glycol (MIRALAX) 17 gram Oral Powder in Packet Take 1 Packet (17 g total) by mouth  Once a day Indications: constipation    rOPINIRole (REQUIP) 2 mg Oral Tablet Take 1 Tablet (2 mg total) by mouth Three times a day       Physical Exam:     Vitals:   temporal temperature is 36.8 C (98.3 F). Her blood pressure is 142/82 (abnormal) and her pulse is 85. Her oxygen saturation is 99%.      General:  Well-nourished, well-developed, presents in wheelchair  Head:  Normocephalic, atraumatic, bilateral frontal and maxillary sinus tenderness  Ears:  Tympanic membranes pearly without erythema or edema bilaterally  Eyes:  Pupils equally round and reactive to light, sclareae non-icteric, conjunctivae clear  Throat:  Oropharynx clear, mucous membranes moist  Neck:   Trachea midline, no thyromegaly or lymphadenopathy  Cardiovascular:  Regular rate and rhythm, no murmurs, rubs, gallops  Respiratory:  Clear to auscultation bilaterally, no wheezes, rhonchi, crackles  Abdominal:  Bowel sounds normal; abdomen soft, suprapubic ttp  Extremities:  No edema  Skin:  Warm and dry  Neurological:  Awake, A&O x 3.   Psychiatric:  Normal mood & affect, thought process linear, speech content normal       Data Reviewed and Interpretation:     Procedure:  Transthoracic complete echo, 2D, spectral and tissue Doppler, color flow Doppler, M-mode.     Quality:  The study images were of technically adequate quality.     Indications: Edema, unspecified type,Nonrheumatic aortic valve stenosis     Conclusions:  The left ventricle is small. Concentric remodeling. There is hyperdynamic left venticular function. No segmental/regional wall motion abnormalities identified. Left ventricular diastolic parameters  are normal.     Normal right ventricular size. Normal right ventricular systolic function. Right ventricular systolic pressure is normal. Right ventricular systolic pressure is 01UXNA.     No significant valvular heart disease.     Findings  Left Ventricle:   The left ventricle is small. Concentric remodeling. There is hyperdynamic left venticular function. No segmental/regional wall motion abnormalities identified. Left ventricular  diastolic parameters are normal.  Right Ventricle:   Normal right ventricular size. Normal right ventricular systolic function. Right ventricular systolic pressure is normal. Right ventricular systolic pressure is 35TDDU.  Left Atrium:   The left atrium is normal in size.  Right Atrium:   The right atrium is of normal size.  Mitral Valve:   Moderate posterior mitral annular calcification. Mitral valve posterior leaflet appears more calcified than the anterior. No evidence of mitral stenosis. There is mild mitral  regurgitation.  Tricuspid Valve:   There is mild tricuspid  regurgitation.  Aortic Valve:   The aortic cusps appear mildly thickened. The aortic valve is not well visualized. No Aortic valve stenosis. Trace aortic regurgitation present.  Pulmonic Valve:   The pulmonic valve is not well visualized. No evidence of pulmonic regurgitation.  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  Aorta:   The aortic root is of normal size.  Pericardium/Pleural space:   Normal pericardium with no pericardial effusion.     Electronically signed by: MD Georgiann Mohs on 02/14/2022 10:30 AM      CBC  Diff   Lab Results   Component Value Date/Time    WBC 5.0 09/05/2022 01:14 PM    HGB 10.4 (L) 09/05/2022 01:14 PM    HCT 31.7 (L) 09/05/2022 01:14 PM    PLTCNT 216 09/05/2022 01:14 PM    RBC 3.43 (L) 09/05/2022 01:14 PM  MCV 92.4 09/05/2022 01:14 PM    MCHC 32.8 09/05/2022 01:14 PM    MCH 30.3 09/05/2022 01:14 PM    MPV 9.8 09/05/2022 01:14 PM    Lab Results   Component Value Date/Time    PMNS 66 09/05/2022 01:14 PM    MONOCYTES 8 09/05/2022 01:14 PM    BASOPHILS 0 09/05/2022 01:14 PM    BASOPHILS <0.10 09/05/2022 01:14 PM    PMNABS 3.30 09/05/2022 01:14 PM    LYMPHSABS 0.91 (L) 09/05/2022 01:14 PM    EOSABS 0.39 09/05/2022 01:14 PM    MONOSABS 0.40 09/05/2022 01:14 PM        COMPREHENSIVE METABOLIC PANEL   Lab Results   Component Value Date    SODIUM 138 09/05/2022    POTASSIUM 4.4 09/05/2022    CHLORIDE 102 09/05/2022    CO2 29 09/05/2022    ANIONGAP 7 09/05/2022    BUN 25 09/05/2022    CREATININE 0.81 09/05/2022    GLUCOSENF 116 09/05/2022    CALCIUM 9.0 09/05/2022    ALBUMIN 3.9 12/27/2021    TOTALPROTEIN 7.0 12/27/2021    ALKPHOS 67 12/27/2021    AST 24 12/27/2021    ALT 15 12/27/2021            Assessment:      Rachel [name] is a 81 y.o. 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         Plan:     (R06.09) DOE (dyspnea on exertion)   (primary encounter diagnosis)  Plan: CXR Pa & Lat, Basic Metabolic Panel Non         fasting, CBC/DIFF, B Type Natriuretic peptide        -Labs and CXR today. Will advise.  -Advised to present to ER with worsening SOB or any chest pain.    (R30.0) Dysuria  Plan: URINALYSIS, MACROSCOPIC AND MICROSCOPIC         W/CULTURE REFLEX      -UA today. Will advise. Could not do a dip as patient is taking Azo.  -Seems to be a chronic issue and is following with urogynecology for urge incontinence, on Mybetriq.     (R09.81) Sinus congestion  Plan:   -Continue Allegra, Mucinex. Will consider antibiotic for sinusitis pending CXR and urine results as if she has a UTI then we can choose something to cover both issues.             Follow Up:       Return to clinic as needed.    Patient had no questions regarding treatment plan, goals, risks or benefits and agrees to contact me or my clinic in the interim should any questions or problems arise.    Loura Back, PA-C  Internal Medicine, [facility]  [address]   [place], [place], [address]  Phone: [phone]  Fax: [phone]

## 2022-10-24 NOTE — Telephone Encounter (Signed)
Received a call from the patient, Domenica. Patient stated that she has been dealing with what they thought was a UTI, but its not. She is urinating all the time, her lower stomach hurts, she has a headache, restless legs, and it is hard to breathe. Patient stated that her shortness of breath is at rest and with exertion. She has been dealing with this for a day. Asked patient if she is able to check her oxygen saturation at home. Patient stated that they are unable too. Informed patient that we would recommend that ER due to her having shortness of breath at rest and with exertion. Patient agreed. Patient asked husband if he would be able to get her up there and he said yes. She said, "to the ER, and he said no up to their office." Informed patient that we recommend that ER. Patient asked what appointments we had available today in clinic. First available appointment was at 11:30 AM with Loura Back. Patient agreeable to appointment date, time, and location.   [name], RN

## 2022-10-24 NOTE — Patient Instructions (Signed)
Continue Allegra 180 mg daily and Mucinex 600 mg twice daily    Labs, urine, and chest xray today

## 2022-10-25 ENCOUNTER — Telehealth (HOSPITAL_BASED_OUTPATIENT_CLINIC_OR_DEPARTMENT_OTHER): Payer: Self-pay | Admitting: Internal Medicine

## 2022-10-25 ENCOUNTER — Other Ambulatory Visit (HOSPITAL_BASED_OUTPATIENT_CLINIC_OR_DEPARTMENT_OTHER): Payer: Self-pay | Admitting: PHYSICIAN ASSISTANT

## 2022-10-25 DIAGNOSIS — N390 Urinary tract infection, site not specified: Secondary | ICD-10-CM

## 2022-10-25 DIAGNOSIS — J329 Chronic sinusitis, unspecified: Secondary | ICD-10-CM

## 2022-10-25 LAB — URINE CULTURE: URINE CULTURE: <10000

## 2022-10-25 MED ORDER — AMOXICILLIN 500 MG-POTASSIUM CLAVULANATE 125 MG TABLET
1.0000 | ORAL_TABLET | Freq: Two times a day (BID) | ORAL | 0 refills | Status: AC
Start: 2022-10-25 — End: 2022-11-01

## 2022-10-25 NOTE — Telephone Encounter (Signed)
Called to inform patient of results, she stated an understanding. She stated she's feeling better this morning and will pick up that antibiotic. Alonza Bogus, RN

## 2022-10-25 NOTE — Telephone Encounter (Signed)
-----   Message from Loura Back, [place] sent at 10/25/2022  8:40 AM EDT -----  Good morning!    Can we please call and let this patient know that her urine did show some white blood cells which could indicate infection. I won't have the culture back for another day or two, but I would like to get her started on an antibiotic for her sinus symptoms and a UTI. I am prescribing Augmentin twice daily for 1 week. She should take this with food. Labs and chest xray did not indicate a cause of her shortness of breath. It may all be congestion from mucous/sinus infection. If SOB continues or worsens then she should let me know and we can pursue further testing.    Thanks!  [place]

## 2022-10-29 ENCOUNTER — Encounter (INDEPENDENT_AMBULATORY_CARE_PROVIDER_SITE_OTHER): Payer: Self-pay | Admitting: PODIATRY

## 2022-11-06 ENCOUNTER — Ambulatory Visit: Payer: Medicare PPO | Attending: Internal Medicine

## 2022-11-06 ENCOUNTER — Other Ambulatory Visit: Payer: Self-pay

## 2022-11-06 ENCOUNTER — Encounter (INDEPENDENT_AMBULATORY_CARE_PROVIDER_SITE_OTHER): Payer: Self-pay

## 2022-11-06 VITALS — BP 141/63 | HR 99 | Temp 97.0°F | Ht 64.0 in | Wt 111.6 lb

## 2022-11-06 DIAGNOSIS — R319 Hematuria, unspecified: Secondary | ICD-10-CM | POA: Insufficient documentation

## 2022-11-06 DIAGNOSIS — N390 Urinary tract infection, site not specified: Secondary | ICD-10-CM | POA: Insufficient documentation

## 2022-11-06 DIAGNOSIS — R300 Dysuria: Secondary | ICD-10-CM | POA: Insufficient documentation

## 2022-11-06 LAB — URINALYSIS, MACROSCOPIC
BILIRUBIN: NEGATIVE mg/dL
BLOOD: NEGATIVE mg/dL
COLOR: NORMAL
GLUCOSE: NEGATIVE mg/dL
KETONES: NEGATIVE mg/dL
LEUKOCYTES: NEGATIVE WBCs/uL
NITRITE: NEGATIVE
PH: 6 (ref 5.0–8.0)
PROTEIN: NEGATIVE mg/dL
SPECIFIC GRAVITY: 1.036 — ABNORMAL HIGH (ref 1.005–1.030)
UROBILINOGEN: NEGATIVE mg/dL

## 2022-11-06 LAB — URINALYSIS, MICROSCOPIC
RBCS: <1 /hpf (ref <6.0)
WBCS: 1 /hpf (ref <11.0)

## 2022-11-06 NOTE — Progress Notes (Signed)
INTERNAL MEDICINE   Medical Group Practice at [facility]  Return Visit Progress Note     Name: Rachel [name] Date of Service: 11/06/2022   MRN:  [id number]  Age/DOB: 81 y.o., Jun 05, 1941 PCP:  [name], MD  Reason for Visit: Urinary Tract Infection and Pain on Urination     Subjective:      Rachel [name] is a 81 y.o. 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 She states she has been burning on urination for the past 4 months with increasing frequency every hour. She denies any fever, chills, or pain in her bilateral flank areas. When she goes to the restroom, she feels like she completely emptied her bladder and does not retain urine. She has previously completed 2 abx one of cefpodoxime for 5 days and ciprofloxacin for 10 days without resolution. Her previous UA studies back in August were positive for E. Coli that was pan-sensitive. She denies any history of nephrolithiasis.      Medical History:      Past Medical History, Allergies, Surgical History, Family, and Social History were reviewed  on   11/06/2022 and updated as needed.     MEDICATIONS:  Outpatient Medications Marked as Taking for the 11/06/22 encounter (Office Visit) with Cristal Deer, MD   Medication Sig    diclofenac sodium (VOLTAREN) 75 mg Oral Tablet, Delayed Release (E.C.) Take 1 Tablet (75 mg total) by mouth Twice daily Indications: joint damage causing pain and loss of function    docusate sodium (COLACE) 100 mg Oral Capsule Take 1 Capsule (100 mg total) by mouth Twice daily for 30 days Indications: constipation    DULoxetine (CYMBALTA DR) 60 mg Oral Capsule, Delayed Release(E.C.) Take 1 Capsule (60 mg total) by mouth Once a day    fexofenadine (ALLEGRA) 180 mg Oral Tablet Take 1 Tablet (180 mg total) by mouth Once a day    fluticasone propionate (FLONASE) 50 mcg/actuation Nasal Spray, Suspension Administer 1 Spray into  each nostril Twice daily    mirabegron (MYRBETRIQ) 25 mg Oral Tablet Sustained Release 24 hr Take 1 Tablet (25 mg total) by mouth Once a day    naloxone (NARCAN) 4 mg per spray nasal spray Instill 1 Spray by INTRANASAL route Every 2 minutes as needed for actual or suspected opioid overdose. Call 911 if used.    Non-Adherent Bandage (TELFA) 3 X 8 " Bandage Apply Vaseline to wounds. Then, apply Telfa pad. Secure with gauze    Norethindrone Ac-Eth Estradiol 0.5-2.5 mg-mcg Oral Tablet Take 1 Tablet by mouth Once a day    rOPINIRole (REQUIP) 2 mg Oral Tablet Take 1 Tablet (2 mg total) by mouth Three times a day       Review of Systems (Bold is Positive):        Check indicates (+). All other findings (-)  Constitutional - '[]'$  fevers '[]'$   night sweats '[]'$  weight changes  HEENT - '[]'$  hearing changes '[]'$  vision changes  Cardiovascular - '[]'$  chest pain '[]'$  swelling   Respiratory -  '[]'$  cough '[]'$  dyspnea '[]'$  wheezing  Gastrointestinal - '[]'$  abdominal pain '[]'$  nausea '[]'$   vomiting '[]'$   diarrhea '[]'$  blood in stool  Genitourinary - '[x]'$  dysuria  '[]'$  incontinence '[x]'$  increased urinary frequency '[]'$   hematuria  Hematologic- '[]'$   easy bruising/bleeding '[]'$  history of clots  Endocrine - '[]'$  diabetes '[]'$   thyroid  disease  Musculoskeletal - '[]'$  myalgias '[]'$  arthralgias   Neurological - '[]'$  headaches '[]'$   dizziness  Integument- '[]'$  rashes '[]'$   itching  Behavioral/Psych - '[]'$  anxiety '[]'$  depression '[]'$   suicidal ideation       Physical Exam:     Vitals:   height is 1.626 m ('5\' 4"'$ ) and weight is 50.6 kg (111 lb 8.8 oz). Her temporal temperature is 36.1 C (97 F). Her blood pressure is 141/63 (abnormal) and her pulse is 99. Her oxygen saturation is 97%.        General:  In no apparent distress. Does not appear toxic or chronically ill.   HEENT:  Normocephalic, atraumatic  Cardiovascular:  RRR.   Respiratory:  CTAB. No use of accessory muscles  Abdominal:  suprapubic tenderness  Extremities:  No edema, clubbing, or cyanosis  Skin:  Warm and dry. No visible rashes on  exposed extremities  Neurological:  Alert and oriented. Motor function and sensation grossly intact  Psychiatric:  Mood and affect normal and congruent       Data Reviewed and Interpretation:     I have reviewed most recent lab studies     Assessment:      Rachel [name] is a 81 y.o. 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     Plan:     Recurrent UTI  Dysuria, urinary frequency  Hematuria  - most recent UA on 11/1 showing WBC with small leuk but no culture growth, RBC's present  - ordered CT renal calculi ASAP  - ordered UA, urine culture (not reflex)     Healthcare Maintenance:     As per PCP    Follow Up:       Return to clinic f/u with PCP  Labs and Imaging Needing Follow Up:  CT renal calculi, UA, urine cx  Goals for Next Visit:  as above    Patient had no questions regarding treatment plan, goals, risks or benefits and agrees to contact me or my clinic in the interim should any questions or problems arise.    Cristal Deer, MD  Internal Medicine, PGY-2    This note may have been partially generated using MModal Fluency Direct system, and there may be some incorrect words, spellings, and punctuation that were not noted in checking the note before saving.       I discussed the patient's care with the Resident prior to the patient leaving the clinic. Any significant discussion points are noted .  Ellyn Hack, MD

## 2022-11-07 ENCOUNTER — Other Ambulatory Visit (HOSPITAL_BASED_OUTPATIENT_CLINIC_OR_DEPARTMENT_OTHER): Payer: Self-pay | Admitting: Internal Medicine

## 2022-11-07 ENCOUNTER — Inpatient Hospital Stay
Admission: RE | Admit: 2022-11-07 | Discharge: 2022-11-07 | Disposition: A | Discharge status: Home / Self Care | Payer: Medicare PPO | Source: Ambulatory Visit | Attending: Internal Medicine | Admitting: Internal Medicine

## 2022-11-07 DIAGNOSIS — Z780 Asymptomatic menopausal state: Secondary | ICD-10-CM

## 2022-11-07 DIAGNOSIS — M1990 Unspecified osteoarthritis, unspecified site: Secondary | ICD-10-CM

## 2022-11-07 DIAGNOSIS — N390 Urinary tract infection, site not specified: Secondary | ICD-10-CM | POA: Insufficient documentation

## 2022-11-07 DIAGNOSIS — R319 Hematuria, unspecified: Secondary | ICD-10-CM | POA: Insufficient documentation

## 2022-11-07 DIAGNOSIS — R300 Dysuria: Secondary | ICD-10-CM | POA: Insufficient documentation

## 2022-11-07 DIAGNOSIS — M4316 Spondylolisthesis, lumbar region: Secondary | ICD-10-CM

## 2022-11-07 LAB — URINE CULTURE: URINE CULTURE: <10000

## 2022-11-07 MED ORDER — OXYCODONE 10 MG TABLET
10.0000 mg | ORAL_TABLET | Freq: Four times a day (QID) | ORAL | 0 refills | Status: DC | PRN
Start: 2022-11-07 — End: 2022-12-11

## 2022-11-07 NOTE — Telephone Encounter (Signed)
Regarding: refill pt is out  ----- Message from Burnadette Pop sent at 11/07/2022 11:10 AM EST -----  Doctor Name: [name], MD      Date of last appointment: 10/24/22    Next scheduled visit: 03/22/23    Medication Requested:     Oxycodone (ROXICODONE) 10 mg Oral Tablet 120 Tablet 0 09/27/2022 10/27/2022   Sig - Route: Take 1 Tablet (10 mg total) by mouth Every 6 hours as needed for Pain for up to 30 days Indications: pain, osteoarthritis - Oral   Sent to pharmacy as: oxyCODONE 10 mg tablet (ROXICODONE)   Class: E-Rx   Earliest Fill Date: 09/27/2022   Non-formulary Exception Code: [id number] Formulary Info Available   E-Prescribing Status: Receipt confirmed by pharmacy (09/27/2022  1:59 PM EDT)           Preferred Pharmacy:      [id number] Dayna Barker, [place] - [address]    [place] [id number]    Phone: [phone] Fax: [phone]    Hours: Not open 24 hours

## 2022-11-07 NOTE — Addendum Note (Signed)
Addended by: Ellyn Hack on: 11/07/2022 09:48 AM     Modules accepted: Orders, Level of Service

## 2022-11-09 ENCOUNTER — Telehealth (INDEPENDENT_AMBULATORY_CARE_PROVIDER_SITE_OTHER): Payer: Self-pay

## 2022-11-09 ENCOUNTER — Encounter (INDEPENDENT_AMBULATORY_CARE_PROVIDER_SITE_OTHER): Payer: Self-pay

## 2022-11-09 NOTE — Telephone Encounter (Signed)
Patient was called and message was left ,as well as MCM2 was sent to contact the [facility]ology [facility] to schedule their referred evaluation.      [facility]   [address]  [place], [place]  [address]  Tel: [phone]

## 2022-11-12 ENCOUNTER — Encounter (INDEPENDENT_AMBULATORY_CARE_PROVIDER_SITE_OTHER): Payer: Self-pay | Admitting: Clinical Neuropsychologist

## 2022-11-21 ENCOUNTER — Other Ambulatory Visit (INDEPENDENT_AMBULATORY_CARE_PROVIDER_SITE_OTHER): Payer: Self-pay

## 2022-11-21 DIAGNOSIS — N938 Other specified abnormal uterine and vaginal bleeding: Secondary | ICD-10-CM

## 2022-11-22 ENCOUNTER — Other Ambulatory Visit (HOSPITAL_COMMUNITY): Payer: Medicare PPO

## 2022-11-26 ENCOUNTER — Encounter (INDEPENDENT_AMBULATORY_CARE_PROVIDER_SITE_OTHER): Payer: Self-pay

## 2022-11-27 ENCOUNTER — Other Ambulatory Visit: Payer: Self-pay

## 2022-11-27 ENCOUNTER — Other Ambulatory Visit (HOSPITAL_BASED_OUTPATIENT_CLINIC_OR_DEPARTMENT_OTHER): Payer: Self-pay | Admitting: OBSTETRICS/GYNECOLOGY

## 2022-11-27 ENCOUNTER — Other Ambulatory Visit (INDEPENDENT_AMBULATORY_CARE_PROVIDER_SITE_OTHER): Payer: Medicare PPO | Admitting: Rheumatology

## 2022-11-27 ENCOUNTER — Ambulatory Visit: Payer: Medicare PPO | Attending: OBSTETRICS/GYNECOLOGY | Admitting: OBSTETRICS/GYNECOLOGY

## 2022-11-27 ENCOUNTER — Encounter (INDEPENDENT_AMBULATORY_CARE_PROVIDER_SITE_OTHER): Payer: Self-pay

## 2022-11-27 ENCOUNTER — Inpatient Hospital Stay (HOSPITAL_BASED_OUTPATIENT_CLINIC_OR_DEPARTMENT_OTHER)
Admission: RE | Admit: 2022-11-27 | Discharge: 2022-11-27 | Disposition: A | Discharge status: Home / Self Care | Payer: Medicare PPO | Source: Ambulatory Visit

## 2022-11-27 VITALS — BP 130/59 | HR 93 | Ht 64.0 in | Wt 108.7 lb

## 2022-11-27 DIAGNOSIS — N3941 Urge incontinence: Secondary | ICD-10-CM | POA: Insufficient documentation

## 2022-11-27 DIAGNOSIS — N938 Other specified abnormal uterine and vaginal bleeding: Secondary | ICD-10-CM | POA: Insufficient documentation

## 2022-11-27 DIAGNOSIS — N888 Other specified noninflammatory disorders of cervix uteri: Secondary | ICD-10-CM

## 2022-11-27 MED ORDER — NORETHINDRONE ACETATE 0.5 MG-ETHINYL ESTRADIOL 2.5 MCG TABLET
1.0000 | ORAL_TABLET | Freq: Every day | ORAL | 3 refills | Status: DC
Start: 2022-11-27 — End: 2022-12-16

## 2022-11-27 NOTE — Progress Notes (Signed)
Chief Complaint   Patient presents with    Recurrent Urinary Tract Infections    Urinary Frequency     History of present illness:  The patient is 81 y.o. female who reports urge urinary incontinence. She reports that her lower urinary tract symptoms have worsened on Myrbetriq. She denies using the estradiol-norethindrone transdermal patch previously prescribed. Her prior treatments include Prempro.     The patient reports burning and pain with urination. She reports wearing pads.     The patient denies surgical history of hysterectomy.    Patient denies any other complaints or concerns at this time.      Past Medical History:   Diagnosis Date    Chronic pain     Depression     GERD (gastroesophageal reflux disease)     Low back pain     Migraine     Neck problem     Osteoarthritis     Shortness of breath     Wears glasses            Past Surgical History:   Procedure Laterality Date    ANTERIOR FUSION CERVICAL SPINE  12/30/2020    BREAST IMPLANT REMOVAL Bilateral     2022    FOOT SURGERY Bilateral     HX BREAST AUGMENTATION Bilateral     x3, 1974, 1994, 2014    HX CATARACT REMOVAL Bilateral 01/12/2021    HX CERVICAL SPINE SURGERY      HX ROTATOR CUFF REPAIR Right     SACROILIAC JOINT INJECTION      TOE AMPUTATION             Current Outpatient Medications   Medication Sig    diclofenac sodium (VOLTAREN) 75 mg Oral Tablet, Delayed Release (E.C.) Take 1 Tablet (75 mg total) by mouth Twice daily Indications: joint damage causing pain and loss of function    DULoxetine (CYMBALTA DR) 60 mg Oral Capsule, Delayed Release(E.C.) Take 1 Capsule (60 mg total) by mouth Once a day    fexofenadine (ALLEGRA) 180 mg Oral Tablet Take 1 Tablet (180 mg total) by mouth Once a day    fluticasone propionate (FLONASE) 50 mcg/actuation Nasal Spray, Suspension Administer 1 Spray into each nostril Twice daily    naloxone (NARCAN) 4 mg per spray nasal spray Instill 1 Spray by INTRANASAL route Every 2 minutes as needed for actual or suspected  opioid overdose. Call 911 if used.    Non-Adherent Bandage (TELFA) 3 X 8 " Bandage Apply Vaseline to wounds. Then, apply Telfa pad. Secure with gauze    Norethindrone Ac-Eth Estradiol 0.5-2.5 mg-mcg Oral Tablet Take 1 Tablet by mouth Once a day    Oxycodone (ROXICODONE) 10 mg Oral Tablet Take 1 Tablet (10 mg total) by mouth Every 6 hours as needed for Pain for up to 30 days Indications: pain, osteoarthritis    rOPINIRole (REQUIP) 2 mg Oral Tablet Take 1 Tablet (2 mg total) by mouth Three times a day       Allergies   Allergen Reactions    Latex     Darvon [Propoxyphene] Mental Status Effect     Hallucinations    Poison Ivy Extract     Shellfish Derived        Social History     Socioeconomic History    Marital status: Married     Spouse name: Carlis Abbott    Number of children: 2    Years of education: Not on file    Highest  education level: Not on file   Occupational History    Occupation: Retired   Tobacco Use    Smoking status: Never    Smokeless tobacco: Never   Vaping Use    Vaping Use: Never used   Substance and Sexual Activity    Alcohol use: Not Currently    Drug use: Not Currently     Frequency: 5.0 times per week     Types: Marijuana     Comment: medical marijuana gel    Sexual activity: Not Currently     Partners: Male   Other Topics Concern    Ability to Walk 1 Flight of Steps without SOB/CP Yes    Routine Exercise No    Ability to Walk 2 Flight of Steps without SOB/CP Not Asked    Unable to Ambulate Not Asked    Total Care Not Asked    Ability To Do Own ADL's Yes    Uses Walker No    Other Activity Level Yes     Comment: light house work    Electronics engineer Yes   Social History Narrative    Right handed; married for 59 years; two daughters (fraternal twins).  Two grandson's and one adopted grandson.      Originally from [place], [place].  Previously lived in [place] for 25 years ([place] and [place]) prior to coming back to [place] in late 2022.     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         Twins are to her first husband (was married for 3 years).         Enjoyed needlepoint, knitting, golf, tennis, gardening, cooking (does not do any of them now)        Daughter's both live in [place], [place].          Social Determinants of Company secretary Strain: Not on file   Transportation Needs: Not on file   Social Connections: Not on file   Intimate Partner Violence: Not on file   Housing Stability: Not on file       Family Medical History:       Problem Relation (Age of Onset)    Arthritis-osteo Sister    Food Allergy Mother, Sister    No Known Problems Father, Maternal Grandfather, Paternal 39, Paternal Grandfather, Daughter    Pneumonia Mother, Maternal Grandmother    Psoriasis Daughter              REVIEW OF SYSTEMS  All 10 systems were reviewed. All systems are negative except those noted in the HPI    PHYSICAL EXAMINATION:   Well-developed, well-nourished female in no apparent distress.  Neck is supple  Respiratory examination reveals no respiratory distress and comfortable breathing on room air.  Cardiac examination reveals extremities well-perfused.  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 Urgency incontinence         PLAN:   Recommended to discontinue Myrbetriq  Explained that vaginal spotting may occur upon beginning estradiol tablet  The patient was counseled on behavioral management to improve lower urinary tract symptoms.  She was instructed on scheduled voiding, weight loss if her BMI was greater than 30, harmful effects of smoking / alcohol use, caffeine reduction,  fluid management /restriction,  reduction in valsalva,  and pelvic floor rehabilitation (Kegel exercises).  She expressed understanding of these instructions and all questions were answered prior to the conclusion of the visit.   Explained that  cranberry juice is a bladder irritant    Follow up testing to include:   Cystoscopy   Urodynamics  Prescriptions filled:   Medication: Norethindrone Ac-Eth Estradiol, oral tablet .   Dosage: 0.5-2.5 mg-mcg, 1x daily.  It was explained to the patient that use of hormonal therapy is thought to increase the risk of breast cancer, heart attacks, strokes, and blood clots, although the actual incidence of a such an event remains quite low.  The dose, form and regimen of hormonal medication were determined based on the patient's requirements, age and reason for therapy.    The benefits and alternatives to hormonal medication were reviewed with the patient.  The patient expressed an understanding and all questions were answered prior initiating treatment.    Orders Placed This Encounter    Norethindrone Ac-Eth Estradiol 0.5-2.5 mg-mcg Oral Tablet      Patient knows to call the office or the [facility] if they have any other problems in the interim.  Scheduler to call patient with date and time of upcoming procedures.    I am scribing for, and in the presence of, Dr. Benjaman Lobe, MD, for services provided on 11/27/2022.  Ollen Gross, SCRIBE     [place], SCRIBE  11/27/22    I personally performed the services described in this documentation, as scribed  in my presence, and it is both accurate  and complete.    Brunetta Jeans, MD

## 2022-11-30 ENCOUNTER — Telehealth (HOSPITAL_BASED_OUTPATIENT_CLINIC_OR_DEPARTMENT_OTHER): Payer: Self-pay | Admitting: Internal Medicine

## 2022-11-30 NOTE — Telephone Encounter (Signed)
Called patient to schedule with Dr. Megan Salon for a 1 hour visit for a medication reconciliation per Dr. Megan Salon. Patient did not answer but left voicemail asking for call back at [phone].    Denna Haggard, [place]

## 2022-12-07 NOTE — Telephone Encounter (Signed)
Regarding: call  ----- Message from Mearl Latin sent at 12/07/2022 10:59 AM EST -----  [name], MD     Pt said she missed a call and is asking for a call back

## 2022-12-07 NOTE — Telephone Encounter (Signed)
Spoke with patient. She agreed to next available 1 hour appt on January 8th at 11 am as she's currently out of town in [place]. Rachel Bogus, RN

## 2022-12-11 ENCOUNTER — Other Ambulatory Visit (HOSPITAL_BASED_OUTPATIENT_CLINIC_OR_DEPARTMENT_OTHER): Payer: Self-pay | Admitting: Internal Medicine

## 2022-12-11 DIAGNOSIS — M1990 Unspecified osteoarthritis, unspecified site: Secondary | ICD-10-CM

## 2022-12-11 MED ORDER — OXYCODONE 10 MG TABLET
10.0000 mg | ORAL_TABLET | Freq: Four times a day (QID) | ORAL | 0 refills | Status: DC | PRN
Start: 2022-12-11 — End: 2023-01-11

## 2022-12-11 NOTE — Telephone Encounter (Signed)
Regarding: refill pt is out  ----- Message from Burnadette Pop sent at 12/11/2022  2:27 PM EST -----  Doctor Name:Natasha Megan Salon, MD      Date of last appointment: 11/06/22    Next scheduled visit: 12/31/22    Medication Requested:     Oxycodone (ROXICODONE) 10 mg Oral Tablet 120 Tablet 0 11/07/2022 12/07/2022   Sig - Route: Take 1 Tablet (10 mg total) by mouth Every 6 hours as needed for Pain for up to 30 days Indications: pain, osteoarthritis - Oral   Sent to pharmacy as: oxyCODONE 10 mg tablet (ROXICODONE)   Class: E-Rx   Earliest Fill Date: 11/07/2022   Non-formulary Exception Code: [id number]/OF Formulary Info Available   E-Prescribing Status: Receipt confirmed by pharmacy (11/07/2022  3:32 PM EST)           Medication issues or side effects that need reported to nurse or physician: PT IS Evan.  NEEDS REFILL     Preferred Pharmacy: [id number] - [place], [place] - [address] [facility] [address]    Phone: [phone] Fax: [phone]    Hours: Not open 24 hours          Notes for Nurse or Physician:

## 2022-12-12 ENCOUNTER — Other Ambulatory Visit (HOSPITAL_BASED_OUTPATIENT_CLINIC_OR_DEPARTMENT_OTHER): Payer: Self-pay | Admitting: Internal Medicine

## 2022-12-12 ENCOUNTER — Encounter (INDEPENDENT_AMBULATORY_CARE_PROVIDER_SITE_OTHER): Payer: Self-pay

## 2022-12-12 DIAGNOSIS — M1990 Unspecified osteoarthritis, unspecified site: Secondary | ICD-10-CM

## 2022-12-13 ENCOUNTER — Ambulatory Visit (HOSPITAL_BASED_OUTPATIENT_CLINIC_OR_DEPARTMENT_OTHER): Payer: Self-pay | Admitting: Internal Medicine

## 2022-12-13 ENCOUNTER — Other Ambulatory Visit (HOSPITAL_BASED_OUTPATIENT_CLINIC_OR_DEPARTMENT_OTHER): Payer: Self-pay | Admitting: INTERNAL MEDICINE

## 2022-12-13 MED ORDER — FLUTICASONE PROPIONATE 50 MCG/ACTUATION NASAL SPRAY,SUSPENSION
1.0000 | Freq: Two times a day (BID) | NASAL | 0 refills | Status: DC
Start: 2022-12-13 — End: 2023-02-06

## 2022-12-13 NOTE — Telephone Encounter (Signed)
Regarding: call pt  ----- Message from Mearl Latin sent at 12/13/2022 11:03 AM EST -----  [name], MD     Pt is asking for a call in regards to her x Oxycodone (ROXICODONE) 10 mg Oral Tablet. She said she is confused with the messages she got on MyChart.

## 2022-12-13 NOTE — Telephone Encounter (Signed)
Called Azalea. Told her the prescription was sent to [facility]. Patient verbalized understanding and had no further concerns.   Corbin Ade, RN

## 2022-12-14 ENCOUNTER — Ambulatory Visit (INDEPENDENT_AMBULATORY_CARE_PROVIDER_SITE_OTHER): Payer: Medicare PPO | Admitting: Mental Health

## 2022-12-14 ENCOUNTER — Other Ambulatory Visit: Payer: Medicare PPO | Attending: OBSTETRICS/GYNECOLOGY | Admitting: Rheumatology

## 2022-12-14 ENCOUNTER — Other Ambulatory Visit (HOSPITAL_BASED_OUTPATIENT_CLINIC_OR_DEPARTMENT_OTHER): Payer: Self-pay | Admitting: OBSTETRICS/GYNECOLOGY

## 2022-12-14 ENCOUNTER — Telehealth (INDEPENDENT_AMBULATORY_CARE_PROVIDER_SITE_OTHER): Payer: Self-pay

## 2022-12-14 ENCOUNTER — Ambulatory Visit (HOSPITAL_BASED_OUTPATIENT_CLINIC_OR_DEPARTMENT_OTHER): Payer: Self-pay

## 2022-12-14 ENCOUNTER — Other Ambulatory Visit: Payer: Self-pay

## 2022-12-14 DIAGNOSIS — R399 Unspecified symptoms and signs involving the genitourinary system: Secondary | ICD-10-CM

## 2022-12-14 LAB — URINALYSIS, MICROSCOPIC
RBCS: 1 /hpf (ref <6.0)
WBCS: 9 /hpf (ref <11.0)

## 2022-12-14 LAB — URINALYSIS, MACROSCOPIC
BILIRUBIN: NEGATIVE mg/dL
BLOOD: NEGATIVE mg/dL
COLOR: NORMAL
GLUCOSE: NEGATIVE mg/dL
KETONES: NEGATIVE mg/dL
NITRITE: NEGATIVE
PH: 6 (ref 5.0–8.0)
PROTEIN: 30 mg/dL — AB
SPECIFIC GRAVITY: 1.039 — ABNORMAL HIGH (ref 1.005–1.030)
UROBILINOGEN: 3 mg/dL — AB

## 2022-12-14 NOTE — Telephone Encounter (Signed)
Patient calling in with bladder pain and UTI symptoms. Urinalysis and culture ordered at this time, recommended patient leave a sample at her closest lab and to take [place]. Patient verbalized understanding.    Cherylann Banas, RN

## 2022-12-16 ENCOUNTER — Other Ambulatory Visit (HOSPITAL_BASED_OUTPATIENT_CLINIC_OR_DEPARTMENT_OTHER): Payer: Self-pay | Admitting: OBSTETRICS/GYNECOLOGY

## 2022-12-16 LAB — URINE CULTURE
URINE CULTURE: 60000 — AB
URINE CULTURE: >100000 — AB

## 2022-12-18 ENCOUNTER — Other Ambulatory Visit (HOSPITAL_BASED_OUTPATIENT_CLINIC_OR_DEPARTMENT_OTHER): Payer: Self-pay | Admitting: OBSTETRICS/GYNECOLOGY

## 2022-12-18 MED ORDER — SULFAMETHOXAZOLE 800 MG-TRIMETHOPRIM 160 MG TABLET
1.0000 | ORAL_TABLET | Freq: Two times a day (BID) | ORAL | 0 refills | Status: AC
Start: 2022-12-18 — End: 2022-12-28

## 2022-12-18 MED ORDER — NORETHINDRONE ACETATE 0.5 MG-ETHINYL ESTRADIOL 2.5 MCG TABLET
1.0000 | ORAL_TABLET | Freq: Every day | ORAL | 3 refills | Status: DC
Start: 2022-12-18 — End: 2023-01-03

## 2022-12-31 ENCOUNTER — Other Ambulatory Visit (HOSPITAL_BASED_OUTPATIENT_CLINIC_OR_DEPARTMENT_OTHER): Payer: Self-pay | Admitting: OBSTETRICS/GYNECOLOGY

## 2022-12-31 ENCOUNTER — Other Ambulatory Visit (HOSPITAL_BASED_OUTPATIENT_CLINIC_OR_DEPARTMENT_OTHER): Payer: Self-pay | Admitting: PHYSICIAN ASSISTANT

## 2022-12-31 ENCOUNTER — Encounter (HOSPITAL_BASED_OUTPATIENT_CLINIC_OR_DEPARTMENT_OTHER): Payer: Self-pay | Admitting: Internal Medicine

## 2022-12-31 ENCOUNTER — Other Ambulatory Visit (HOSPITAL_BASED_OUTPATIENT_CLINIC_OR_DEPARTMENT_OTHER): Payer: Self-pay | Admitting: GENERAL PRACTICE

## 2022-12-31 ENCOUNTER — Other Ambulatory Visit (HOSPITAL_BASED_OUTPATIENT_CLINIC_OR_DEPARTMENT_OTHER): Payer: Self-pay | Admitting: Internal Medicine

## 2022-12-31 ENCOUNTER — Other Ambulatory Visit: Payer: Self-pay

## 2022-12-31 ENCOUNTER — Ambulatory Visit: Payer: Medicare PPO | Attending: Internal Medicine | Admitting: Internal Medicine

## 2022-12-31 VITALS — BP 124/68 | HR 104 | Temp 97.0°F | Ht 64.0 in | Wt 102.0 lb

## 2022-12-31 DIAGNOSIS — K921 Melena: Secondary | ICD-10-CM | POA: Insufficient documentation

## 2022-12-31 DIAGNOSIS — D509 Iron deficiency anemia, unspecified: Secondary | ICD-10-CM | POA: Insufficient documentation

## 2022-12-31 DIAGNOSIS — F39 Unspecified mood [affective] disorder: Secondary | ICD-10-CM

## 2022-12-31 DIAGNOSIS — G2581 Restless legs syndrome: Secondary | ICD-10-CM

## 2022-12-31 DIAGNOSIS — R1013 Epigastric pain: Secondary | ICD-10-CM | POA: Insufficient documentation

## 2022-12-31 DIAGNOSIS — R350 Frequency of micturition: Secondary | ICD-10-CM | POA: Insufficient documentation

## 2022-12-31 LAB — COMPREHENSIVE METABOLIC PANEL, NON-FASTING
ALBUMIN: 3.7 g/dL (ref 3.4–4.8)
ALKALINE PHOSPHATASE: 47 U/L — ABNORMAL LOW (ref 55–145)
ALT (SGPT): 17 U/L (ref 8–22)
ANION GAP: 8 mmol/L (ref 4–13)
AST (SGOT): 27 U/L (ref 8–45)
BILIRUBIN TOTAL: 0.4 mg/dL (ref 0.3–1.3)
BUN/CREA RATIO: 29 — ABNORMAL HIGH (ref 6–22)
BUN: 24 mg/dL (ref 8–25)
CALCIUM: 8.3 mg/dL — ABNORMAL LOW (ref 8.6–10.3)
CHLORIDE: 105 mmol/L (ref 96–111)
CO2 TOTAL: 23 mmol/L (ref 23–31)
CREATININE: 0.83 mg/dL (ref 0.60–1.05)
ESTIMATED GFR - FEMALE: 71 mL/min/BSA (ref >=60)
GLUCOSE: 89 mg/dL (ref 65–125)
POTASSIUM: 4.8 mmol/L (ref 3.5–5.1)
PROTEIN TOTAL: 6.7 g/dL (ref 6.0–8.0)
SODIUM: 136 mmol/L (ref 136–145)

## 2022-12-31 LAB — URINALYSIS, MICROSCOPIC
RBCS: <1 /hpf (ref <6.0)
WBCS: 1 /hpf (ref <11.0)

## 2022-12-31 LAB — URINALYSIS, MACROSCOPIC
BILIRUBIN: NEGATIVE mg/dL
BLOOD: NEGATIVE mg/dL
COLOR: NORMAL
GLUCOSE: NEGATIVE mg/dL
KETONES: NEGATIVE mg/dL
LEUKOCYTES: NEGATIVE WBCs/uL
NITRITE: NEGATIVE
PH: 7 (ref 5.0–8.0)
PROTEIN: NEGATIVE mg/dL
SPECIFIC GRAVITY: 1.021 (ref 1.005–1.030)
UROBILINOGEN: NEGATIVE mg/dL

## 2022-12-31 LAB — CBC WITH DIFF
BASOPHIL #: <0.1 10*3/uL (ref <=0.20)
BASOPHIL %: 0.6 %
EOSINOPHIL #: 0.34 10*3/uL (ref <=0.50)
EOSINOPHIL %: 10.8 %
HCT: 30.3 % — ABNORMAL LOW (ref 34.8–46.0)
HGB: 10.1 g/dL — ABNORMAL LOW (ref 11.5–16.0)
IMMATURE GRANULOCYTE #: <0.1 10*3/uL (ref <0.10)
IMMATURE GRANULOCYTE %: 0.3 % (ref 0.0–1.0)
LYMPHOCYTE #: 0.93 10*3/uL — ABNORMAL LOW (ref 1.00–4.80)
LYMPHOCYTE %: 29.5 %
MCH: 30.4 pg (ref 26.0–32.0)
MCHC: 33.3 g/dL (ref 31.0–35.5)
MCV: 91.3 fL (ref 78.0–100.0)
MONOCYTE #: 0.32 10*3/uL (ref 0.20–1.10)
MONOCYTE %: 10.2 %
MPV: 9.5 fL (ref 8.7–12.5)
NEUTROPHIL #: 1.53 10*3/uL (ref 1.50–7.70)
NEUTROPHIL %: 48.6 %
PLATELETS: 206 10*3/uL (ref 150–400)
RBC: 3.32 10*6/uL — ABNORMAL LOW (ref 3.85–5.22)
RDW-CV: 14.3 % (ref 11.5–15.5)
WBC: 3.2 10*3/uL — ABNORMAL LOW (ref 3.7–11.0)

## 2022-12-31 MED ORDER — ONDANSETRON HCL (PF) 4 MG/2 ML INJECTION SOLUTION
4.0000 mg | INTRAMUSCULAR | Status: AC
Start: 2022-12-31 — End: 2022-12-31
  Administered 2022-12-31: 4 mg via INTRAMUSCULAR

## 2022-12-31 MED ORDER — ONDANSETRON 4 MG DISINTEGRATING TABLET
4.0000 mg | ORAL_TABLET | Freq: Three times a day (TID) | ORAL | 0 refills | Status: DC | PRN
Start: 2022-12-31 — End: 2022-12-31

## 2022-12-31 MED ORDER — ONDANSETRON 4 MG DISINTEGRATING TABLET
4.0000 mg | ORAL_TABLET | ORAL | Status: AC
Start: 2022-12-31 — End: 2022-12-31
  Administered 2022-12-31: 4 mg via ORAL

## 2022-12-31 MED ORDER — ONDANSETRON 4 MG DISINTEGRATING TABLET
4.0000 mg | ORAL_TABLET | ORAL | Status: DC
Start: 2022-12-31 — End: 2022-12-31

## 2022-12-31 MED ORDER — ACETAMINOPHEN 325 MG TABLET
650.0000 mg | ORAL_TABLET | Freq: Once | ORAL | Status: AC
Start: 2022-12-31 — End: 2022-12-31
  Administered 2022-12-31: 650 mg via ORAL

## 2022-12-31 MED ORDER — SODIUM CHLORIDE 0.9 % INTRAVENOUS SOLUTION
1000.0000 mL | Freq: Once | INTRAVENOUS | Status: AC
Start: 2022-12-31 — End: 2022-12-31
  Administered 2022-12-31: 1000 mL via INTRAVENOUS

## 2022-12-31 MED ORDER — ACETAMINOPHEN ER 650 MG TABLET,EXTENDED RELEASE
650.0000 mg | ORAL_TABLET | Freq: Once | ORAL | Status: DC
Start: 2022-12-31 — End: 2022-12-31

## 2022-12-31 MED ORDER — ONDANSETRON HCL (PF) 4 MG/2 ML INJECTION SOLUTION
4.0000 mg | INTRAMUSCULAR | Status: DC
Start: 2022-12-31 — End: 2022-12-31

## 2022-12-31 NOTE — Progress Notes (Signed)
INTERNAL MEDICINE   [facility]  Return Visit Progress Note     Name: Rachel [name] Date of Service: 12/31/2022   MRN:  [id number]  Age/DOB: 82 y.o., 02-07-1941 PCP:  [name], MD  Reason for Visit: Medication Check F/U     Subjective:      [name] is a 82 y.o. 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  Reports taking Voltaren EC once daily instead of twice daily because it upsets her stomach, she is also taking tylenol nightly and then her PRN oxycodone for OA and hasn't had any relief of her pain  She said that she was only able to tolerate two sips of coffee this morning and doesn't think she could drink water now  This morning she had a bowel movement and says it was black and hard to pass    Husband reports horrible experience in the ER and refuses to go     Medical History:      Past Medical History, Allergies, Surgical History, Family, and Social History were reviewed  on   12/31/2022 and updated as needed.     MEDICATIONS:  Outpatient Medications Marked as Taking for the 12/31/22 encounter (Office Visit) with [name], MD   Medication Sig    diclofenac sodium (VOLTAREN) 75 mg Oral Tablet, Delayed Release (E.C.) Take 1 Tablet (75 mg total) by mouth Twice daily Indications: joint damage causing pain and loss of function    DULoxetine (CYMBALTA DR) 60 mg Oral Capsule, Delayed Release(E.C.) Take 1 Capsule (60 mg total) by mouth Once a day    fexofenadine (ALLEGRA) 180 mg Oral Tablet Take 1 Tablet (180 mg total) by  mouth Once a day    fluticasone propionate (FLONASE) 50 mcg/actuation Nasal Spray, Suspension Administer 1 Spray into each nostril Twice daily    Non-Adherent Bandage (TELFA) 3 X 8 " Bandage Apply Vaseline to wounds. Then, apply Telfa pad. Secure with gauze    Norethindrone Ac-Eth Estradiol 0.5-2.5 mg-mcg Oral Tablet Take 1 Tablet by mouth Once a day    Oxycodone (ROXICODONE) 10 mg Oral Tablet Take 1 Tablet (10 mg total) by mouth Every 6 hours as needed for Pain for up to 30 days Indications: pain, osteoarthritis (Patient taking differently: Take 1 Tablet (10 mg total) by mouth Every 6 hours as needed for Pain Patient states takes 4 times a day Indications: pain, osteoarthritis)    rOPINIRole (REQUIP) 2 mg Oral Tablet Take 1 Tablet (2 mg total) by mouth Three times a day       Review of Systems:      Other than stated above all other ROS are negative.      Physical Exam:     Vitals:   height is 1.626 m ('5\' 4"'$ ) and weight is 46.3 kg (102 lb). Her temporal temperature is 36.1 C (97 F). Her blood pressure is 124/68 and her pulse is 104 (abnormal). Her oxygen saturation is 96%.  General:  in mod distress  Head:  Normocephalic, atraumatic  Eyes:  Pupils equally round, extraocular muscles intact, sclareae non-icteric, conjunctivae clear  Neck:  Trachea midline, no thyromegaly or lymphadenopathy  Cardiovascular:  tachycardic, regular  Respiratory:  Clear to auscultation bilaterally, no wheezes, rhonchi, crackles  Abdominal:  TTP in epigastric area, dec BS         Data Reviewed and Interpretation:     COMPLETE BLOOD COUNT   Lab Results   Component Value Date    WBC 5.0 10/24/2022    HGB 11.1 (L) 10/24/2022    HCT 34.5 (L) 10/24/2022    PLTCNT 191 10/24/2022       DIFFERENTIAL  Lab Results   Component Value Date    PMNS 70 10/24/2022    MONOCYTES 6 10/24/2022    BASOPHILS 1 10/24/2022    BASOPHILS <0.10 10/24/2022    PMNABS 3.48 10/24/2022    LYMPHSABS 0.91 (L) 10/24/2022    EOSABS 0.23 10/24/2022    MONOSABS 0.30  10/24/2022     COMPREHENSIVE METABOLIC PANEL   Lab Results   Component Value Date    SODIUM 136 10/24/2022    POTASSIUM 4.3 10/24/2022    CHLORIDE 102 10/24/2022    CO2 31 10/24/2022    ANIONGAP 3 (L) 10/24/2022    BUN 17 10/24/2022    CREATININE 0.68 10/24/2022    CALCIUM 9.3 10/24/2022    ALBUMIN 3.9 12/27/2021    TOTALPROTEIN 7.0 12/27/2021    ALKPHOS 67 12/27/2021    AST 24 12/27/2021    ALT 15 12/27/2021            Assessment:      Rachel [name] is a 82 y.o. female with PMH of  chronic pain 2/2 OA, migraines who presents to the clinic for med rec         Plan:     (D50.9) IDA (iron deficiency anemia)  (primary encounter diagnosis)  Plan: CT ABDOMEN PELVIS WO IV CONTRAST, CBC/DIFF,         Comp Metabolic Panel Nonfasting, CANCELED:         CBC/DIFF, CANCELED: Comp Metabolic Panel         Nonfasting          (K92.1) Melena  Plan: CT ABDOMEN PELVIS WO IV CONTRAST, CBC/DIFF,         Comp Metabolic Panel Nonfasting, CANCELED:         CBC/DIFF, CANCELED: Comp Metabolic Panel         Non fasting    (R10.13) Epigastric pain  Plan: CT ABDOMEN PELVIS WO IV CONTRAST, Comp         Metabolic Panel Nonfasting, CANCELED: Comp         Metabolic Panel Nonfasting        Spoke with auth department to get CT done stat  Place IV, NS x 1L, Zofran IV 4 mg  No relief with Zofran 4 mg SL  If CT a/p unable to be performed today will need direct admission for further evaluation    (R35.0) Urinary frequency  Plan: Comp Metabolic Panel Nonfasting, URINALYSIS,         MACROSCOPIC AND MICROSCOPIC W/CULTURE REFLEX,         CANCELED: Comp Metabolic Panel Nonfasting,         CANCELED: URINALYSIS, MACROSCOPIC AND         MICROSCOPIC W/CULTURE REFLEX  Healthcare Maintenance:     N/a, visit for med rec today    Follow Up:             Patient had no questions regarding treatment plan, goals, risks or benefits and agrees to contact me or my clinic in the interim should any questions or problems arise.    [name], MD

## 2022-12-31 NOTE — Addendum Note (Signed)
Addended by: [name] on: 12/31/2022 01:43 PM     Modules accepted: Orders

## 2023-01-01 ENCOUNTER — Other Ambulatory Visit (HOSPITAL_BASED_OUTPATIENT_CLINIC_OR_DEPARTMENT_OTHER): Payer: Self-pay | Admitting: Internal Medicine

## 2023-01-01 ENCOUNTER — Telehealth (HOSPITAL_BASED_OUTPATIENT_CLINIC_OR_DEPARTMENT_OTHER): Payer: Self-pay | Admitting: Internal Medicine

## 2023-01-01 DIAGNOSIS — G2581 Restless legs syndrome: Secondary | ICD-10-CM

## 2023-01-01 MED ORDER — FEXOFENADINE 180 MG TABLET
180.0000 mg | ORAL_TABLET | Freq: Every day | ORAL | 3 refills | Status: DC
Start: 2023-01-01 — End: 2023-01-11

## 2023-01-01 MED ORDER — ONDANSETRON 4 MG DISINTEGRATING TABLET
4.0000 mg | ORAL_TABLET | Freq: Three times a day (TID) | ORAL | 0 refills | Status: DC | PRN
Start: 2023-01-01 — End: 2023-07-17

## 2023-01-01 MED ORDER — TELFA 3" X 8" BANDAGE
CUTANEOUS | 3 refills | Status: DC
Start: 2023-01-01 — End: 2023-02-20

## 2023-01-01 MED ORDER — ROPINIROLE 2 MG TABLET
2.0000 mg | ORAL_TABLET | Freq: Three times a day (TID) | ORAL | 0 refills | Status: DC
Start: 2023-01-01 — End: 2023-01-01

## 2023-01-01 MED ORDER — DULOXETINE 60 MG CAPSULE,DELAYED RELEASE
60.0000 mg | DELAYED_RELEASE_CAPSULE | Freq: Every day | ORAL | 3 refills | Status: DC
Start: 2023-01-01 — End: 2023-01-11

## 2023-01-01 NOTE — Telephone Encounter (Signed)
-----   Message from [name], MD sent at 01/01/2023 12:07 PM EST -----  Can you please let patient know that her labs are within normal limits or stable compared to prior values. I don't see her CT a/p stat scheduled yet. Can you also let her know that I'm referring her to the gastroenterologist for further evaluation of her anemia and abdominal pain for possible scope and that she should stop taking the voltaren and can take 650 mg of tylenol every 8 hours instead.     Clark, her spouse said that if she cannot be reached on her cell phone to please call him listed under emergency contacts. Patient would prefer to be called first.

## 2023-01-01 NOTE — Telephone Encounter (Signed)
Rachel Nay, MD  [facility]  Can you please let patient know that her labs are within normal limits or stable compared to prior values. I don't see her CT a/p stat scheduled yet. Can you also let her know that I'm referring her to the gastroenterologist for further evaluation of her anemia and abdominal pain for possible scope and that she should stop taking the voltaren and can take 650 mg of tylenol every 8 hours instead.    Clark, her spouse said that if she cannot be reached on her cell phone to please call him listed under emergency contacts. Patient would prefer to be called first.       Called patient at provided telephone number in regards to patient's results. Patient answered and confirmed name and DOB. Relayed provider's message directly to the patient who stated an understanding and had no further questions or concerns at this time  Noni Saupe, RN

## 2023-01-01 NOTE — Telephone Encounter (Unsigned)
Rx pended for your review.    Lavanya Roa, RN

## 2023-01-03 ENCOUNTER — Other Ambulatory Visit: Payer: Self-pay

## 2023-01-03 ENCOUNTER — Encounter (HOSPITAL_BASED_OUTPATIENT_CLINIC_OR_DEPARTMENT_OTHER): Payer: Self-pay

## 2023-01-03 ENCOUNTER — Other Ambulatory Visit (HOSPITAL_BASED_OUTPATIENT_CLINIC_OR_DEPARTMENT_OTHER): Payer: Self-pay | Admitting: OBSTETRICS/GYNECOLOGY

## 2023-01-03 ENCOUNTER — Ambulatory Visit (HOSPITAL_BASED_OUTPATIENT_CLINIC_OR_DEPARTMENT_OTHER): Payer: Medicare PPO | Admitting: OBSTETRICS/GYNECOLOGY

## 2023-01-03 ENCOUNTER — Ambulatory Visit: Payer: Medicare PPO | Attending: OBSTETRICS/GYNECOLOGY

## 2023-01-03 VITALS — BP 142/56 | HR 94 | Temp 97.9°F | Ht 64.0 in | Wt 107.6 lb

## 2023-01-03 VITALS — BP 142/56 | HR 94 | Temp 97.9°F | Ht 64.0 in | Wt 106.7 lb

## 2023-01-03 DIAGNOSIS — N3010 Interstitial cystitis (chronic) without hematuria: Secondary | ICD-10-CM | POA: Insufficient documentation

## 2023-01-03 DIAGNOSIS — R399 Unspecified symptoms and signs involving the genitourinary system: Secondary | ICD-10-CM | POA: Insufficient documentation

## 2023-01-03 MED ORDER — NITROFURANTOIN MONOHYDRATE/MACROCRYSTALS 100 MG CAPSULE
100.0000 mg | ORAL_CAPSULE | Freq: Once | ORAL | Status: AC
Start: 2023-01-03 — End: 2023-01-03
  Administered 2023-01-03: 100 mg via ORAL

## 2023-01-03 MED ORDER — AMITRIPTYLINE 10 MG TABLET
10.0000 mg | ORAL_TABLET | Freq: Every evening | ORAL | 1 refills | Status: DC
Start: 2023-01-03 — End: 2023-03-18

## 2023-01-03 MED ORDER — ESTRADIOL 10 MCG VAGINAL TABLET
10.0000 ug | ORAL_TABLET | VAGINAL | 3 refills | Status: DC
Start: 2023-01-03 — End: 2023-04-04

## 2023-01-03 NOTE — Procedures (Signed)
OB/GYN, [place] [address]  Operated [facility]  Procedure Note    Name: NATLIE ASFOUR MRN:  [id number]   Date: 01/03/2023 Age: 82 y.o.  DOB:   06/30/1941       Cysto-Diag    Performed by: Brunetta Jeans, MD  Authorized by: Brunetta Jeans, MD    Procedure discussed: discussed risks, benefits and alternatives    Chaperone present: yes    Timeout: timeout called immediately prior to procedure    Prep: patient was prepped and draped in usual sterile fashion    Prep type: Hibiclens    Anesthesia: local anesthesia      Procedure Details     Cystoscope type: flexible    Cystoscopy route: transurethral      Cystoscopy location: native bladder      Irrigation used: saline      Position: dorsal lithotomy    Urethra     Urethra: normal      Bladder     Bladder: normal      Post-Procedure Details     Catheter placed: no      Appearance of urine after procedure: clear    Outcome: patient tolerated procedure well with no complications      Disposition: discharged home in satisfactory condition            Brunetta Jeans, MD

## 2023-01-03 NOTE — Procedures (Signed)
OB/GYN, [place]  [place] [address]  [facility]ated [facility]  Procedure Note    Name: Rachel [name] MRN:  [id number]   Date: 01/03/2023 Age: 82 y.o.  DOB:   09-14-1941       Urodynamics    Performed by: Brunetta Jeans, MD  Authorized by: Brunetta Jeans, MD    Procedure discussed: discussed risks, benefits and alternatives    Chaperone present: yes    Timeout: timeout called immediately prior to procedure    Prep: patient was prepped and draped in usual sterile fashion      Procedure Details     Procedure: CMG with UPP/voiding pressures, EMG, intra-abdominal voiding study and uroflow      CMG with UPP/Voiding Pressures Details:     First sensation (mL): 114    First urge to void (mL): 135    Urgency to void (mL): 141    Bladder capacity (mL): 157    EMG Details:     Results: normal      Intra-abdominal Voiding Study Details:     Rectal catheter placed to record intra-abdominal pressure: yes      Uroflow Details:     Voided urine (mL): 150    Residual urine (mL): 0    Normal void for patient: yes      Post-Procedure Details     Outcome: patient tolerated procedure well with no complications      Post-procedure interventions: post-procedure instructions given      Additional Details      Assessment: Small bladder capacity / 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    It was explained to the patient that use of hormonal therapy is thought to increase the risk of breast cancer, heart attacks, strokes, and blood clots, although the actual incidence of a such an event remains quite low.  The dose, form and regimen of hormonal medication were determined based on the patient's requirements, age and reason for therapy.    The benefits and alternatives to hormonal medication were reviewed with the patient.  The patient expressed an understanding and all questions were answered prior  initiating treatment.      Side effect profile and potential risks of antidepressant medication were reviewed with the patient.  These include but are not necessarily limited to:  Headache, upset stomach, bleeding problems, weight gain, decreased libido, and possible suicidal thinking.  The patient was advised to call immediately upon developing any of the above side effects.   The patient expressed an understanding and all questions were answered.        F/u in 3 months.        Brunetta Jeans, MD

## 2023-01-03 NOTE — Progress Notes (Signed)
Attending Surgeon: Dr Gershon Crane    Indication for Procedure:  frequent uti    Name of Procedure: Complex Urodynamics and Rigid Cystourethroscopy     Description of Procedure:  Macrobid 100 mg given to patient after the procedure. The patient voided prior to procedure, UA neg. For the urodynamic testing, the patient was first placed in the dorsolithotomy position. The patient's genitalia was prepped with Chlorhexidine using sterile technique. Per protocol, a urodynamic urethral catheter was placed to fill the bladder and record intravesical pressure, the bladder was drained to assess post-void residual(PVR) bladder volume, and a vaginal catheter was placed to record abdominal pressure, and electromyography (EMG) electrodes were placed for neuromuscular testing.     After placement of catheters and confirming the bladder was empty, the patient was placed in a sitting position. The transducers were zeroed and equalized according to guidelines. Commenced bladder filling using room-temperature NaCl. Cold fluid may evoke false-positive detrusor contractions (ie, phasic contractions). The bladder was filled at a 40 ml/min rate. Some patients may not tolerate a fill rate as high as others and at times test may need to be repeated to confirm a less than average bladder capacity.Throughout entire procedure while filling bladder, questions were asked of patient so that markers could be noted for first sensation, first desire, strong desire, and finally patient's sensation of bladder fullness verbalizing capacity and desire to void. Monitored bladder compliance, the change in volume per change in pressure, and marked the presence of single events that may have occurred. Some evidence suggests ranges of value for bladder compliance of 40 ml/cm H2O to 120 ml/cm H2O may be normal, while values of 10 ml/cm H2O to 20 ml/cm H2O have been suggested as abnormal.    When the bladder was filled to 157 mL,  the abdominal leak-point pressure  (ALPP) was measured to investigate for stress urinary incontinence. The input of NaCl was paused, the patient was instructed to perform the Valsalva maneuver in gradients (ie, mild, moderate, strong) followed by cough (ie, mild, moderate, strong).Studies have suggested an ALPP under 60 cm H2O suggests intrinsic sphincter deficiency, while one over 90 cm H2O excludes it, and values between may be seen in patients with or without it. Upon completion of ALPP testing, the filling of the bladder resumed. During this time, it may be prudent to measure the Detrusor Leak Point Pressure (DLPP) to assess the pressures at which urine is stored. Specifically, DLPP is the pressure at which urine leakage occurs in the absence of a detrusor contraction or abdominal pressure increase. This should normally be less than 40 cm H2O; if it is higher than this, the kidneys are at risk for damage secondary to back-pressure. Once DLPP is determined, or when the bladder is filled to capacity and the patient has a strong desire to void and sensation of capacity, the patient was instructed to void.     During voiding CMG, also noted on graph is the activity of the EMG electrodes and voiding cystogram for evidence of detrusor sphincter dyssynergia (DSD) occurring. The presence of DSD is confirmed by increases in EMG activity during detrusor contraction or closure. Urine spillage on electrodes can result in a noisy signal that may resemble increased muscle activity. Catheters removed and CMG/Urodynamics completed.  A reasonable range for first desire to void is 100 to 200 cc, normal desire to void 150 to 350 cc, urgency 250 to 500 cc, and maximum cystometric capacity 300 to 600 cc. A small rise in detrusor pressure (  ie, less than 10 to 15 cm of water) without undue sensation of urgency up to a capacity of 400 to 500 is also normal.    PATIENT's RESULTS:      PVR prior to starting CMG procedure-- 0 ml  First Sensation-114 ml  First Desire- 135  ml  Strong Desire- 141 ml   Capacity- 157 ml  Input -- 157 ml  Output-- 157 ml  PVR-- 0  ml    Patient remained awake and stable through entire procedure, tolerating it well and without any difficulty. Physician notified of CMG results.    Patient prepped with Chlorhexidine for Cystoscopy. 2% Lidocaine gel topical local urojet given into patient's urethra. Surgeon performed Cystoscopy.     Patient tolerated procedures without difficulty.  Physician to review plan of care with patient.  Patient dressed and escorted to front desk. Patient to RTC as instructed by provider.    Patient education for procedure:  Patient advised after the procedure, patient may note increased urinary frequency, urgency, hematuria, or dysuria for day of procedure. Increasing fluid intake helps to flush out the bladder. Advised patient to avoid or limit bladder irritants. Patient advised of bladder irritants, such as smoking nicotine (if the patient smokes), alcohol, spicy food, chocolate, and caffeine. These can also include citrus fruits and juices, tomatoes, and carbonated drinks. These may worsen patient's symptoms and to try cutting certain foods out of diet for a few weeks. Then, add the foods back into diet. See whether this has any effect on symptoms.    Patient was also educated on how to improve their voiding by scheduled voiding and limiting liquids approximately 2 hours prior to bedtime. Scheduled voiding by voiding on a schedule of approximately every 2-3 hours even if they do not have to void.  In addition, they are instructed to double-void.  This means they should void in the bathroom and then attempt to void again a few minutes later, this may allow for elimination of some more left over urine.  This maneuver can improve bladder function  Advised the patient that is normal to void up to 2 times per night.      Patient educated on signs of infection, such as fever, chills, low back pain, or persistent blood in the urine,  should be reported to the clinic or to go to the Emergency Room for evaluation.     Completed CMG consists of:  Uroflowmetry. This measures the amount and speed of urine you void from your bladder.   Cystometry. This test evaluates how much your bladder can hold. It also measures how strong your bladder muscle is and how well the signals work that tell you when your bladder is full.   Electromyogram. This helps evaluate the muscle contractions that control urination, such as sphincter muscle contractions  Pressure flow study. This test measures your detrusor, urethral, and abdominal pressures. Detrusor is the muscle surrounding the bladder walls that relaxes to allow your bladder to fill, and and contracts to squeeze out urine      PROCEDURE PRINT OUT OF RESULTS SCANNED INTO MEDIA.     Cherylann Banas, RN

## 2023-01-07 ENCOUNTER — Telehealth (HOSPITAL_BASED_OUTPATIENT_CLINIC_OR_DEPARTMENT_OTHER): Payer: Self-pay | Admitting: Internal Medicine

## 2023-01-07 NOTE — Telephone Encounter (Signed)
Dr. Audery Amel  Patient has not completed the CT scan.  I have contact patient and even transferred her phone call to radiology to schedule.  She did not arrange for her appointment.  "01/03/23 transferred pt call to radiology dept to sch [facility]man  01/02/23 lvm to notify and sch bhickman "  [facility]  Dept of Medicine

## 2023-01-11 ENCOUNTER — Other Ambulatory Visit (HOSPITAL_BASED_OUTPATIENT_CLINIC_OR_DEPARTMENT_OTHER): Payer: Self-pay | Admitting: PHYSICIAN ASSISTANT

## 2023-01-11 ENCOUNTER — Other Ambulatory Visit (HOSPITAL_BASED_OUTPATIENT_CLINIC_OR_DEPARTMENT_OTHER): Payer: Self-pay | Admitting: Internal Medicine

## 2023-01-11 ENCOUNTER — Other Ambulatory Visit (HOSPITAL_BASED_OUTPATIENT_CLINIC_OR_DEPARTMENT_OTHER): Payer: Self-pay | Admitting: OBSTETRICS/GYNECOLOGY

## 2023-01-11 DIAGNOSIS — N3010 Interstitial cystitis (chronic) without hematuria: Secondary | ICD-10-CM

## 2023-01-11 DIAGNOSIS — M1990 Unspecified osteoarthritis, unspecified site: Secondary | ICD-10-CM

## 2023-01-11 DIAGNOSIS — F39 Unspecified mood [affective] disorder: Secondary | ICD-10-CM

## 2023-01-11 MED ORDER — OXYCODONE 10 MG TABLET
10.0000 mg | ORAL_TABLET | Freq: Four times a day (QID) | ORAL | 0 refills | Status: DC | PRN
Start: 2023-01-11 — End: 2023-02-12

## 2023-01-11 MED ORDER — DULOXETINE 60 MG CAPSULE,DELAYED RELEASE
60.0000 mg | DELAYED_RELEASE_CAPSULE | Freq: Every day | ORAL | 3 refills | Status: DC
Start: 2023-01-11 — End: 2023-07-17

## 2023-01-11 MED ORDER — FEXOFENADINE 180 MG TABLET
180.0000 mg | ORAL_TABLET | Freq: Every day | ORAL | 3 refills | Status: DC
Start: 2023-01-11 — End: 2023-03-19

## 2023-01-14 NOTE — Telephone Encounter (Signed)
Prescription already refilled 01/11

## 2023-01-18 ENCOUNTER — Ambulatory Visit (HOSPITAL_BASED_OUTPATIENT_CLINIC_OR_DEPARTMENT_OTHER): Payer: Medicare PPO | Admitting: OBSTETRICS/GYNECOLOGY

## 2023-01-18 ENCOUNTER — Other Ambulatory Visit: Payer: Self-pay

## 2023-01-30 ENCOUNTER — Ambulatory Visit (HOSPITAL_BASED_OUTPATIENT_CLINIC_OR_DEPARTMENT_OTHER): Payer: Self-pay | Admitting: Family

## 2023-02-01 ENCOUNTER — Ambulatory Visit: Payer: Medicare PPO | Attending: Family | Admitting: Family

## 2023-02-01 ENCOUNTER — Other Ambulatory Visit: Payer: Self-pay

## 2023-02-01 ENCOUNTER — Encounter (HOSPITAL_BASED_OUTPATIENT_CLINIC_OR_DEPARTMENT_OTHER): Payer: Self-pay | Admitting: Family

## 2023-02-01 VITALS — BP 138/70 | HR 88 | Temp 98.1°F | Ht 64.0 in | Wt 108.9 lb

## 2023-02-01 DIAGNOSIS — M549 Dorsalgia, unspecified: Secondary | ICD-10-CM

## 2023-02-01 DIAGNOSIS — G8929 Other chronic pain: Secondary | ICD-10-CM | POA: Insufficient documentation

## 2023-02-01 DIAGNOSIS — M542 Cervicalgia: Secondary | ICD-10-CM

## 2023-02-01 DIAGNOSIS — R519 Headache, unspecified: Secondary | ICD-10-CM | POA: Insufficient documentation

## 2023-02-01 MED ORDER — SUMATRIPTAN 100 MG TABLET
100.0000 mg | ORAL_TABLET | Freq: Once | ORAL | 0 refills | Status: DC | PRN
Start: 2023-02-01 — End: 2023-03-18

## 2023-02-01 MED ORDER — TIZANIDINE 2 MG TABLET
2.0000 mg | ORAL_TABLET | Freq: Three times a day (TID) | ORAL | 0 refills | Status: DC
Start: 2023-02-01 — End: 2023-02-20

## 2023-02-01 NOTE — Progress Notes (Signed)
INTERNAL MEDICINE   [facility]  Acute Visit Progress Note     Name: Rachel [name] Date of Service: 02/01/2023   MRN:  [id number]  Age/DOB: 82 y.o., 11/30/1941 PCP:  [name], MD  Reason for Visit: Chronic Pain and Headache     Subjective:      Rachel [name] is a 82 y.o. 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     Headache x 3 days. Previously prescribed Fioricet back in [place]. States that all her medications were taken away from her and changed when she came here. Has not taken anything at home for headaches. She has previously been evaluated by Neurology who started her on Topamax, Imitrex and flexeril. She isn't sure if these medications helped or not. Doesn't know why they fell off her medication list.     Reports having sinusitis. Took Mucinex yesterday which helped.     Patient states she is in extreme pain in her neck and back. She states the pain is much more severe than her typical pain. States it has been going on for about 3 weeks. No new injury/trauma. Has been applying heat which has not helped. Has been taking tylenol and oxycodone for pain which helps some. Pain currently 8-9/10. This morning, patient taken her oxycodone. Has not taken any tylenol. She has been taking oxycodone QID. Tylenol 650 mg BID. Patient denies any new incontinence. No new weakness / numbness / tingling.     She states the Requip makes her too sleepy and she can't take it anymore.      Medical History:      Past Medical History, Allergies, Surgical History, Family, and Social History were reviewed  on   02/01/2023 and updated as needed.     MEDICATIONS:  Outpatient Medications Marked as Taking for the 02/01/23 encounter (Office Visit) with Gwendolyn Lima, APRN,FNP-BC   Medication Sig    amitriptyline (ELAVIL) 10 mg Oral Tablet Take 1 Tablet (10 mg total) by mouth Every night    DULoxetine (CYMBALTA DR) 60 mg Oral Capsule, Delayed Release(E.C.)  Take 1 Capsule (60 mg total) by mouth Once a day    estradiol (VAGIFEM) vaginal tablet Insert 1 Tablet (10 mcg total) into the vagina Every MON and THURS    fexofenadine (ALLEGRA) 180 mg Oral Tablet Take 1 Tablet (180 mg total) by mouth Once a day    fluticasone propionate (FLONASE) 50 mcg/actuation Nasal Spray, Suspension Administer 1 Spray into each nostril Twice daily    rOPINIRole (REQUIP) 2 mg Oral Tablet TAKE 1 TABLET(2 MG) BY MOUTH THREE TIMES DAILY    sumatriptan succinate (IMITREX) 100 mg Oral Tablet Take 1 Tablet (100 mg total) by mouth Once, as needed for Migraine for up to 1 dose May repeat in 2 hours in needed    tiZANidine (ZANAFLEX) 2 mg Oral Tablet Take 1 Tablet (2 mg total) by mouth Three times a day     Physical Exam:     Vitals:   height is 1.626 m (5' 4"$ ) and weight is 49.4 kg (108 lb 14.5 oz). Her temporal temperature is 36.7 C (98.1 F). Her blood pressure is 138/70 and her pulse is 88. Her oxygen saturation is 98%.      Physical Exam  Constitutional:       General: She is not in acute distress.     Appearance: Normal appearance. She is not ill-appearing.  HENT:      Head: Normocephalic and atraumatic.   Cardiovascular:      Rate and Rhythm: Normal rate.   Pulmonary:      Effort: Pulmonary effort is normal. No respiratory distress.   Neurological:      General: No focal deficit present.      Mental Status: She is alert.   Psychiatric:         Mood and Affect: Mood normal.         Behavior: Behavior normal.              Data Reviewed and Interpretation:     N/A     Assessment:     Rachel [name] is a 82 y.o. 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     Plan:     Chronic pain  Neck Pain  Back Pain  Plan: Refer to [facility]  -Continue with Oxycodone 10 mg Q6 hours PRN  -Continue Tylenol Arthritis 650 mg BID    Headache  -Imitrex 100 mg PRN  -Tizanidine 2 mg TID PRN         Follow Up:       Return to clinic as  previously scheduled or sooner if needed.   Labs and Imaging Needing Follow Up:  N/A    Patient had no questions regarding treatment plan, goals, risks or benefits and agrees to contact me or my clinic in the interim should any questions or problems arise.    Gwendolyn Lima, APRN,FNP-BC  Internal Medicine  [place]         The patient was seen independently.      On the day of the encounter, a total of  30 minutes was spent on this patient encounter including review of historical information, examination, documentation and post-visit activities.

## 2023-02-06 ENCOUNTER — Other Ambulatory Visit (HOSPITAL_BASED_OUTPATIENT_CLINIC_OR_DEPARTMENT_OTHER): Payer: Self-pay | Admitting: Internal Medicine

## 2023-02-06 ENCOUNTER — Other Ambulatory Visit (HOSPITAL_BASED_OUTPATIENT_CLINIC_OR_DEPARTMENT_OTHER): Payer: Self-pay | Admitting: INTERNAL MEDICINE

## 2023-02-07 ENCOUNTER — Other Ambulatory Visit (HOSPITAL_COMMUNITY): Payer: Medicare PPO

## 2023-02-11 ENCOUNTER — Ambulatory Visit (HOSPITAL_BASED_OUTPATIENT_CLINIC_OR_DEPARTMENT_OTHER): Payer: Self-pay | Admitting: Internal Medicine

## 2023-02-11 NOTE — Telephone Encounter (Signed)
Regarding: pt unable to proceed with CT scan/increase in pain  ----- Message from Frederico Hamman sent at 02/11/2023  4:05 PM EST -----  [name], MD    Pt called in requesting to speak with Dr. Megan Salon. States she is still experiencing a lot of pain but was unable to proceed with pelvic CT scan as she couldn't drink all the contrast needed to do the imagining. Wanting to discuss other options. Please call pt to advise. Thank you!

## 2023-02-12 ENCOUNTER — Encounter (HOSPITAL_BASED_OUTPATIENT_CLINIC_OR_DEPARTMENT_OTHER): Payer: Self-pay

## 2023-02-12 DIAGNOSIS — M1990 Unspecified osteoarthritis, unspecified site: Secondary | ICD-10-CM

## 2023-02-12 MED ORDER — OXYCODONE 10 MG TABLET
10.0000 mg | ORAL_TABLET | Freq: Four times a day (QID) | ORAL | 0 refills | Status: DC | PRN
Start: 2023-02-12 — End: 2023-03-18

## 2023-02-12 NOTE — Telephone Encounter (Signed)
[name], MD  You14 minutes ago (2:59 PM)     Can you put her on for a phone visit please      You  [name], MD23 hours ago (4:13 PM)     Please advise, thank you so much!     You23 hours ago (4:12 PM)     Regarding: pt unable to proceed with CT scan/increase in pain  ----- Message from Frederico Hamman sent at 02/11/2023  4:05 PM EST -----  [name], MD     Pt called in requesting to speak with Dr. Megan Salon. States she is still experiencing a lot of pain but was unable to proceed with pelvic CT scan as she couldn't drink all the contrast needed to do the imagining. Wanting to discuss other options. Please call pt to advise. Thank you!               Note          MyChart messaged patient in an attempt to make appointment with Dr. Megan Salon.   Raeanne Barry, RN

## 2023-02-20 ENCOUNTER — Encounter (HOSPITAL_BASED_OUTPATIENT_CLINIC_OR_DEPARTMENT_OTHER): Payer: Self-pay | Admitting: Internal Medicine

## 2023-02-20 ENCOUNTER — Other Ambulatory Visit: Payer: Self-pay

## 2023-02-20 ENCOUNTER — Ambulatory Visit: Payer: Medicare PPO | Attending: Internal Medicine | Admitting: Internal Medicine

## 2023-02-20 DIAGNOSIS — R1310 Dysphagia, unspecified: Secondary | ICD-10-CM

## 2023-02-20 DIAGNOSIS — M1990 Unspecified osteoarthritis, unspecified site: Secondary | ICD-10-CM

## 2023-02-20 DIAGNOSIS — R1084 Generalized abdominal pain: Secondary | ICD-10-CM

## 2023-02-20 MED ORDER — DEXLANSOPRAZOLE 30 MG CAPSULE,BIPHASE DELAYED RELEASE
30.0000 mg | DELAYED_RELEASE_CAPSULE | Freq: Every day | ORAL | 0 refills | Status: DC
Start: 2023-02-20 — End: 2023-02-21

## 2023-02-20 NOTE — Progress Notes (Signed)
INTERNAL MEDICINE, [place] [address]  Operated by [place]  Telephone Visit    Name:  Rachel [name] MRN: [id number]   Date:  02/20/2023 Age:   82 y.o.     The patient/family initiated a request for telephone service.  Verbal consent for this service was obtained from the patient/family.    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 She takes tylenol in between that. She has an appt with pain management on 3/1 but did not want to see that provider again. Open to going to the [place] office or looking outside of [place].    She is no longer having melena after stopped drinking coffee and stopped using voltaren.  Abd pain passes as soon as she eats something.        ICD-10-CM    1. Osteoarthritis, unspecified osteoarthritis type, unspecified site  M19.90 Refer to [facility]    currently on '10mg'$  q6h of oxycodone  referred to pain management for further guidance  tylenol PRN for breakthrough      2. Dysphagia, unspecified type  R13.10 FLUORO ESOPHAGRAM (BA SWALLOW)     CT ABDOMEN PELVIS W IV CONTRAST    barium swallow ordered      3. Generalized postprandial abdominal pain  R10.84 CT ABDOMEN PELVIS W IV CONTRAST    CT a/p ordered with IV contrast, unable to tolerate PO  ordered dexilant again, hoping to get approved now that she failed protonix, pepcid and nexium for 8 wks          Total provider time spent with the patient on the phone: 12 minutes.    [name], MD

## 2023-02-21 ENCOUNTER — Other Ambulatory Visit (HOSPITAL_BASED_OUTPATIENT_CLINIC_OR_DEPARTMENT_OTHER): Payer: Self-pay | Admitting: Internal Medicine

## 2023-02-21 MED ORDER — DEXLANSOPRAZOLE 30 MG CAPSULE,BIPHASE DELAYED RELEASE
30.0000 mg | DELAYED_RELEASE_CAPSULE | Freq: Every day | ORAL | 0 refills | Status: DC
Start: 2023-02-21 — End: 2023-05-21

## 2023-02-21 NOTE — Telephone Encounter (Signed)
Received fax requesting refill, Rx pended.  Raeanne Barry, RN

## 2023-02-22 ENCOUNTER — Ambulatory Visit (INDEPENDENT_AMBULATORY_CARE_PROVIDER_SITE_OTHER): Payer: Self-pay | Admitting: NURSE PRACTITIONER-ADULT HEALTH

## 2023-03-08 ENCOUNTER — Encounter (HOSPITAL_BASED_OUTPATIENT_CLINIC_OR_DEPARTMENT_OTHER): Payer: Self-pay | Admitting: Internal Medicine

## 2023-03-08 ENCOUNTER — Other Ambulatory Visit: Payer: Self-pay

## 2023-03-08 ENCOUNTER — Inpatient Hospital Stay
Admission: RE | Admit: 2023-03-08 | Discharge: 2023-03-08 | Disposition: A | Discharge status: Home / Self Care | Payer: Medicare PPO | Source: Ambulatory Visit | Attending: Internal Medicine | Admitting: Internal Medicine

## 2023-03-08 DIAGNOSIS — K5710 Diverticulosis of small intestine without perforation or abscess without bleeding: Secondary | ICD-10-CM

## 2023-03-08 DIAGNOSIS — R935 Abnormal findings on diagnostic imaging of other abdominal regions, including retroperitoneum: Secondary | ICD-10-CM

## 2023-03-08 DIAGNOSIS — R1084 Generalized abdominal pain: Secondary | ICD-10-CM | POA: Insufficient documentation

## 2023-03-08 DIAGNOSIS — R160 Hepatomegaly, not elsewhere classified: Secondary | ICD-10-CM

## 2023-03-08 DIAGNOSIS — R1310 Dysphagia, unspecified: Secondary | ICD-10-CM | POA: Insufficient documentation

## 2023-03-08 DIAGNOSIS — K838 Other specified diseases of biliary tract: Secondary | ICD-10-CM

## 2023-03-08 DIAGNOSIS — M1990 Unspecified osteoarthritis, unspecified site: Secondary | ICD-10-CM

## 2023-03-08 DIAGNOSIS — K5790 Diverticulosis of intestine, part unspecified, without perforation or abscess without bleeding: Secondary | ICD-10-CM

## 2023-03-08 LAB — POC ISTAT CREATININE (RESULT)
CREATININE, POC: 0.9 mg/dL (ref 0.60–1.05)
ESTIMATED GFR FEMALE: >60 mL/min/{1.73_m2} (ref >=60)

## 2023-03-08 MED ORDER — IOPAMIDOL 370 MG IODINE/ML (76 %) INTRAVENOUS SOLUTION
53.0000 mL | INTRAVENOUS | Status: AC
Start: 2023-03-08 — End: 2023-03-08
  Administered 2023-03-08: 53 mL via INTRAVENOUS

## 2023-03-18 ENCOUNTER — Other Ambulatory Visit (HOSPITAL_BASED_OUTPATIENT_CLINIC_OR_DEPARTMENT_OTHER): Payer: Self-pay | Admitting: OBSTETRICS/GYNECOLOGY

## 2023-03-18 ENCOUNTER — Other Ambulatory Visit (HOSPITAL_BASED_OUTPATIENT_CLINIC_OR_DEPARTMENT_OTHER): Payer: Self-pay | Admitting: Family

## 2023-03-18 ENCOUNTER — Encounter (INDEPENDENT_AMBULATORY_CARE_PROVIDER_SITE_OTHER): Payer: Medicare PPO

## 2023-03-18 DIAGNOSIS — N3010 Interstitial cystitis (chronic) without hematuria: Secondary | ICD-10-CM

## 2023-03-18 MED ORDER — OXYCODONE 10 MG TABLET
10.0000 mg | ORAL_TABLET | Freq: Four times a day (QID) | ORAL | 0 refills | Status: DC | PRN
Start: 2023-03-18 — End: 2023-04-17

## 2023-03-18 MED ORDER — AMITRIPTYLINE 10 MG TABLET
10.0000 mg | ORAL_TABLET | Freq: Every evening | ORAL | 1 refills | Status: DC
Start: 2023-03-18 — End: 2023-04-04

## 2023-03-18 MED ORDER — SUMATRIPTAN 100 MG TABLET
100.0000 mg | ORAL_TABLET | Freq: Once | ORAL | 0 refills | Status: DC | PRN
Start: 2023-03-18 — End: 2023-07-17

## 2023-03-18 NOTE — Telephone Encounter (Signed)
Rx pended for review.  Haelie Clapp, RN

## 2023-03-18 NOTE — Telephone Encounter (Signed)
From: Rachel [name]  Sent: 03/16/2023 5:04 PM EDT  To: [facility] Clinical Support  Subject: Ct scan    Dr Megan Salon   It's time for my oxycodone refill 10 mg.   Last filled on 02/12/23 count 120. Out today

## 2023-03-19 NOTE — Progress Notes (Signed)
INTERNAL MEDICINE, [place] [address]  Operated by [facility]  Medicare Annual Wellness Visit    Name: Rachel [name] MRN:  [id number]   Date: 03/22/2023 Age: 82 y.o.       SUBJECTIVE:   Rachel [name] is a 82 y.o. female for presenting for Medicare Wellness exam.   I have reviewed and reconciled the medication list with the patient today.    Comprehensive Health Assessment:  Paper document [place] reviewed and scanned into medical record    I have reviewed and updated as appropriate the past medical, family and social history. 03/22/2023 as summarized below:  Past Medical History:   Diagnosis Date    Chronic pain     Depression     GERD (gastroesophageal reflux disease)     Low back pain     Memory changes     Migraine     Neck problem     Osteoarthritis     Shortness of breath     Wears glasses      Past Surgical History:   Procedure Laterality Date    Anterior fusion cervical spine  12/30/2020    Breast implant removal Bilateral     Cystourethroscopy      Foot surgery Bilateral     Hx breast augmentation Bilateral     Hx cataract removal Bilateral 01/12/2021    Hx cervical spine surgery      Hx rotator cuff repair Right     Sacroiliac joint injection      Toe amputation       Current Outpatient Medications   Medication Sig    amitriptyline (ELAVIL) 10 mg Oral Tablet Take 1 Tablet (10 mg total) by mouth Every night    dexlansoprazole (DEXILANT) 30 mg Oral Cap, Delayed Rel., Multiphasic Take 1 Capsule (30 mg total) by mouth Once a day Indications: gastroesophageal reflux disease, failed protonix, pepcid, nexium    DULoxetine (CYMBALTA DR) 60 mg Oral Capsule, Delayed Release(E.C.) Take 1 Capsule (60 mg total) by mouth Once a day    estradiol (VAGIFEM) vaginal tablet Insert 1 Tablet (10 mcg total) into the vagina Every MON and THURS    fluticasone propionate (FLONASE) 50 mcg/actuation Nasal Spray, Suspension SHAKE LIQUID  AND USE 1 SPRAY IN EACH NOSTRIL TWICE DAILY    ondansetron (ZOFRAN ODT) 4 mg Oral Tablet, Rapid Dissolve Take 1 Tablet (4 mg total) by mouth Every 8 hours as needed for Nausea/Vomiting    Oxycodone (ROXICODONE) 10 mg Oral Tablet Take 1 Tablet (10 mg total) by mouth Every 6 hours as needed for Pain for up to 30 days Indications: pain, osteoarthritis    sumatriptan succinate (IMITREX) 100 mg Oral Tablet Take 1 Tablet (100 mg total) by mouth Once, as needed for Migraine for up to 12 doses May repeat in 2 hours in needed     Family Medical History:       Problem Relation (Age of Onset)    Arthritis-osteo Sister    Food Allergy Mother, Sister    No Known Problems Father, Maternal Grandfather, Paternal Grandmother, Paternal Grandfather, Daughter    Pneumonia Mother, Maternal Grandmother    Psoriasis Daughter            Social History     Socioeconomic History    Marital status: Married     Spouse name: Carlis Abbott    Number of children: 2   Occupational History  Occupation: Retired   Tobacco Use    Smoking status: Never    Smokeless tobacco: Never   Vaping Use    Vaping status: Never Used   Substance and Sexual Activity    Alcohol use: Not Currently    Drug use: Not Currently     Frequency: 5.0 times per week     Types: Marijuana     Comment: medical marijuana gel    Sexual activity: Not Currently     Partners: Male   Social History Narrative    Right handed; married for 75 years; two daughters (fraternal twins).  Two grandson's and one adopted grandson.      Originally from [place], [place].  Previously lived in [place] for 25 years ([place] and [place]) prior to coming back to [place] in late 2022.     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         Twins are to her first husband (was married for 3 years).         Enjoyed needlepoint, knitting, golf, tennis, gardening, cooking (does not do any of them now)        Daughter's both live in [place], [place].          Social  Determinants of Health     Health Literacy: Low Risk  (09/05/2022)    Health Literacy     SDOH Health Literacy: Never         List of Current Health Care Providers   Care Team       PCP       Name Type Specialty Phone Number    [name], MD Physician INTERNAL MEDICINE [phone]              Care Team       Name Type Specialty Phone Number    Ginette Pitman, [facility] Nurse Practitioner INTERNAL MEDICINE [phone]    Ernesta Amble, [place] Physician PODIATRIST-GENERAL PRACTICE [phone]    Berniece Pap, [place] Physician PODIATRIST-GENERAL PRACTICE [phone]    Sheppard Plumber, MD FACS Physician VASCULAR SURGERY [phone]    Lucretia Field, MD Physician OPHTHALMOLOGY [phone]    Billie Ruddy, RDLD Dietitian DIETICIAN-REGISTERED [phone]    Lindie Spruce, MD Physician NEUROLOGICAL SURGERY [phone]    Raphael Gibney, [place] Psychologist CLINICAL NEUROPSYCHOLOGIST [phone]    Gaynelle Arabian, PT Physical Therapist PHYSICAL THERAPY [phone]    [employer] Not available OPTOMETRIST Not available                      Health Maintenance   Topic Date Due    Adult Tdap-Td (2 - Td or Tdap) 10/03/2021    Depression Screening  03/21/2024    Influenza Vaccine  Completed    Shingles Vaccine  Completed    Covid-19 Vaccine  Completed    Medicare Annual Wellness Visit - Calendar Year Insurers  Completed    Pneumococcal Vaccination, Age 41+  Completed    Meningococcal Vaccine  Aged Out    Osteoporosis screening  Discontinued     Medicare Wellness Assessment   Medicare initial or wellness physical in the last year?: Yes  Advance Directives   Does patient have a living will or MPOA: Yes                  Activities of Daily Living   Do you need help with dressing, bathing, or walking?: No  Do you need help with shopping, housekeeping, medications, or finances?: Yes   Do you have rugs in hallways, broken steps, or poor lighting?: No   Do you have grab bars in your bathroom,  non-slip strips in your tub, and hand rails on your stairs?: Yes   Cognitive Function Screen (1=Yes, 0=No)   What is you age?: Correct   What is the time to the nearest hour?: Correct   What is the year?: Correct   What is the name of this clinic?: Correct   Can the patient recognize two persons (the doctor, the nurse, home help, etc.)?: Correct   What is the date of your birth? (day and month sufficient) : Correct   In what year did World War II end?: Incorrect   Who is the current president of the [place]?: Correct   Count from 20 down to 1?: Correct   What address did I give you earlier?: Incorrect   Total Score: 8       Fall Risk Screen   Do you feel unsteady when standing or walking?: Yes  Do you worry about falling?: Yes  Have you fallen in the past year?: Yes  How many times have you fallen?: Once  Were you ever injured from falling?: No   Depression Screen     Little interest or pleasure in doing things.: Not at all  Feeling down, depressed, or hopeless: Not at all  PHQ 2 Total: 0     Pain Score   Pain Score:   5    Substance Use-Abuse Screening     Tobacco Use     In Past 12 MONTHS, how often have you used any tobacco product (for example, cigarettes, e-cigarettes, cigars, pipes, or smokeless tobacco)?: Never     Alcohol use     In the PAST 12 MONTHS, how often have you had 5 (men)/4 (women) or more drinks containing alcohol in one day?: Never     Prescription Drug Use     In the PAST 12 months, how often have you used any prescription medications just for the feeling, more than prescribed, or that were not prescribed for you? Prescriptions may include: opioids, benzodiazepines, medications for ADHD: Never           Illicit Drug Use   In the PAST 12 MONTHS, how often have you used any drugs, including marijuana, cocaine or crack, heroin, methamphetamine, hallucinogens, ecstasy/MDMA?: Never            OBJECTIVE:   BP (!) 142/78   Pulse 90   Temp 36.4 C (97.5 F) (Temporal)   Ht 1.619 m (5' 3.74")    Wt 52 kg (114 lb 10.2 oz)   SpO2 97%   BMI 19.84 kg/m      General: appears in good health, stated age, no apparent distress  Mental Status: alert, awake, aware of self and environment, cooperative. Speech fluent; words clear. Oriented to person, place, time.  HEENT: skull normocephalic.   Thorax and Lungs: chest symmetric with good expansion; respirations regular, even, unlabored at rest.   Gait with normal base.   Psych: congruent mood and affect, communicative, thought process coherent and insight is good.   Skin: warm, dry.     Other appropriate exam:  Wt Readings from Last 10 Encounters:   03/22/23 52 kg (114 lb 10.2 oz)   02/01/23 49.4 kg (108 lb 14.5 oz)   01/03/23 48.4 kg (106 lb 11.2 oz)   01/03/23 48.8  kg (107 lb 9.4 oz)   12/31/22 46.3 kg (102 lb)   11/27/22 49.3 kg (108 lb 11 oz)   11/06/22 50.6 kg (111 lb 8.8 oz)   10/16/22 49 kg (108 lb 0.4 oz)   10/15/22 49.4 kg (108 lb 14.5 oz)   09/11/22 49.4 kg (108 lb 14.5 oz)     Body mass index is 19.84 kg/m.    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)    Dysphagia    Malnutrition (CMS HCC)    Memory deficit    Mood disorder (CMS HCC)    Osteoarthritis    Other nonthrombocytopenic purpura (CMS HCC)    Pre-syncope     Other Visit Diagnoses       Medicare annual wellness visit, subsequent    -  Primary             Identified Risk Factors/ Recommended Actions   (Z00.00) Medicare annual wellness visit, subsequent  (primary encounter diagnosis)  Plan: follow up in one year    (F39) Mood disorder (CMS HCC)  Plan: noted; continue elavil, Cymbalta as directed; mood stable    (D69.2) Other nonthrombocytopenic purpura (CMS HCC)  Plan: noted; follows with vascular    (E46) Malnutrition, unspecified type (CMS [place])  Plan: noted; continue dexilant; follows with nutritionist    (D32.0) Benign neoplasm of  cerebral meninges (CMS [place])  Plan: noted; follows with neuro    (R55) Pre-syncope  Plan: noted; change position slowly, adequate hydration/nutrition    (R13.10) Dysphagia, unspecified type  Plan: noted; stable    (R41.3) Memory deficit  Plan: noted; was referred to neuropsych for testing    [id number]) Other osteoarthritis of spine, cervical region  Plan: noted; takes oxycodone; use as directed, and continue Cymbalta as directed     Fall Risk Follow up plan of care: Vitamin D supplementation advised  Discussed optimizing home safety  Footwear and potential problems addressed     Opioid use plan of care:         Opioids use: Plan: Assessment of pain is completed. Advised to follow up with opioid prescribing provider.    No orders of the defined types were placed in this encounter.         The patient has been educated about risk factors and recommended preventive care. Written Prevention Plan completed/ updated and given to patient (see After Visit Summary).    Return in about 1 year (around 03/21/2024) for schedule medicare wellness.      DISH, DNP, FNP-C  Medicare Wellness Provider  [place]  Operated by [facility]  [place] [address]  Dept: [phone]  Dept Fax: [phone]

## 2023-03-19 NOTE — Patient Instructions (Addendum)
Medicare Preventive Services  Medicare coverage information Recommendation for YOU   Heart Disease and Diabetes   Lipid profile Every 5 years or more often if at risk for cardiovascular disease     Lab Results   Component Value Date    CHOLESTEROL 230 (H) 09/13/2021    HDLCHOL 72 09/13/2021    LDLCHOL 125 (H) 09/13/2021    TRIG 167 (H) 09/13/2021         Diabetes Screening    Yearly for those at risk for diabetes, 2 tests per year for those with prediabetes Last Glucose:   No results found for: "GLUCOSEFAST"   Lab Results   Component Value Date    HA1C 5.7 (H) 09/05/2022        Diabetes Self Management Training or Medical Nutrition Therapy  For those with diabetes, up to 10 hrs initial training within a year, subsequent years up to 2 hrs of follow up training Optional for those with diabetes     Medical Nutrition Therapy  Three hours of one-on-one counseling in first year, two hours in subsequent years Optional for those with diabetes, kidney disease   Intensive Behavioral Therapy for Obesity  Face-to-face counseling, first month every week, month 2-6 every other week, month 7-12 every month if continued progress is documented Optional for those with Body Mass Index 30 or higher  Your There is no height or weight on file to calculate BMI.   Tobacco Cessation (Quitting) Counseling   Covers up to 8 smoking and tobacco-use cessation counseling sessions in a 39-month period.    Optional for those that use tobacco  N/A   Cancer Screening Last Completion Date   Colorectal screening   For anyone age 83 to 28 or any age if high risk:  Screening Colonoscopy every 10 yrs if low risk,  more frequent if higher risk  OR  Cologuard Stool DNA test once every 3 years OR  Fecal Occult Blood Testing yearly OR  Flexible  Sigmoidoscopy  every 5 yr OR  CT Colonography every 5 yrs  N/A    See below for due date if applicable.   Screening Pap Test   Recommended every 3 years for all women age 54 to 67, or every five years if combined with  HPV test (routine screening not needed after total hysterectomy).  Medicare covers every 2 years or yearly if high risk.  Screening Pelvic Exam   Medicare covers every 2 years, yearly if high risk or childbearing age with abnormal Pap in last 3 yrs. N/A    See below for due date if applicable.   Screening Mammogram   Recommended every 2 years for women age 28 to 38, or more frequent if you have a higher risk. Selectively recommended for women between 40-49 based on shared decisions about risk. Covered by Medicare up to every year for women age 40 or older N/A  See below for due date if applicable.         Lung Cancer Screening  Annual low dose computed tomography (LDCT scan) is recommended for those age 108-80 who smoked 20 pack-years and are current smokers or quit smoking within past 15 years, after counseling by your doctor or nurse clinician about the possible benefits or harms. N/A    See below for due date if applicable.   Vaccinations   Respiratory syncytial virus (RSV)  Age 5 years or older: Based on shared clinical decision-making with your provider.  Pneumococcal Vaccine  Recommended routinely  age 29+ with one or two separate vaccines based on your risk. Recommended before age 60 if medical conditions with increased risk  Seasonal Influenza Vaccine  Once every flu season   Hepatitis B Vaccine  3 doses if risk (including anyone with diabetes or liver disease)  Shingles Vaccine  Two doses at age 72 or older  Diphtheria Tetanus Pertussis Vaccine  ONCE as adult, booster every 10 years     Immunization History   Administered Date(s) Administered    Covid-19 Vaccine,Moderna Bivalent 09/13/2021    Covid-19 Vaccine,Moderna,12 Years+ 02/14/2020, 03/12/2020, 11/14/2020    Covid-19 Vaccine,Pfizer(Comirnaty),86mcg/0.3ml(2023-2024),12 yr+ 10/15/2022    High-Dose Influenza Vaccine, 65+ 10/25/2015, 11/04/2015, 09/23/2016, 09/23/2017, 10/01/2018, 11/02/2019    INFLUENZA VIRUS VACCINE (ADMIN) 09/15/2016    Influenza  Vaccine, 6 month-adult 09/24/2007, 10/05/2010, 12/20/2012, 11/24/2013, 09/30/2018, 09/23/2020    Influenza Vaccine, 65+ 11/12/2020, 10/15/2022    PREVNAR 13 11/04/2015    Pneumovax 10/03/2021    Shingrix - Zoster Vaccine 06/19/2017, 10/03/2021    Tetanus Toxoid/Diphtheria Toxoid/Acellular Pertussis Vaccine, Adsorbed 10/04/2011    ZOSTAVAX (VARICELLA ZOSTER VACCINE) 06/04/2007   Due for Tdap booster (see below)    Shingles vaccine and Diphtheria Tetanus Pertussis vaccines are available at pharmacies or local health department without a prescription.   Other Preventative Screening  Last Completion Date   Bone Densitometry   Screening: All females ages 78 and older every 10 years if initial screening normal. Postmenopausal women ages 7-64 need screening with one or more risk factor: previous fracture, parental hip fracture, current smoker, low body weight, excessive alcohol use, Rheumatoid Arthritis   For women with diagnosed Osteoporosis, follow up is recommended every 2 years or a frequency recommended by your provider.     See below for due date if applicable.     Glaucoma Screening   Yearly if in high risk group such as diabetes, family history, African American age 12+ or Hispanic American age 55+   See your eye care provider for screening.  10/15/2022   Hepatitis C Screening   Recommended  for those born between ages 18-79 years.     See below for due date if applicable.     HIV Testing  Recommended routinely at least ONCE, covered every year for age 103 to 7 regardless of risk, and every year for age over 66 who ask for the test or higher risk. Yearly or up to 3 times in pregnancy         See below for due date if applicable.   Abdominal Aortic Aneurysm Screening Ultrasound   Once with a family history of abdominal aortic aneurysms OR a female between 67-75 and have smoked at least 100 cigarettes in your lifetime.         See below for due date if applicable.       Your Personalized Schedule for Preventive Tests      Health Maintenance: Pending and Last Completed         Date Due Completion Date    Adult Tdap-Td (2 - Td or Tdap) 10/03/2021 10/04/2011    Medicare Annual Wellness Visit - Calendar Year Insurers 12/24/2022 03/21/2022    Depression Screening 01/01/2024 12/31/2022               Non-Opioid Treatment for Chronic Pains     Treatment for chronic pain can be managed without opioids. Below are non-opioid options that may be considered and discussed with your provider to determine which options would be best for your  health.    Over the counter or presciptions medications:  Acetaminophen (Tylenol) or Non-steroidal anti-inflammatories such as: Ibuprofen (Motrin, Advil), naproxen (Aleve), aspirin  Antidepressants such as amitriptyline, nortriptyline (Pamelor),  Doxepin (Silenor), Imipramine (Tofranil) and others.  Anticonvulsant Nerve pain medications: Gabapentin (Neurontin), pregabalin (Lyrica)  Externally applied medications such as NSAID'S, lidocaine, capsaisin, and others  Injections: pain specialists can sometimes inject medications at the site of pain.    Alternative therapies such as  Acupuncture  Osteopathic manipulation  Chiropractic  Massage therapy       For Information on Advanced Directives for Health Care:  [place]:  [email or web address]  [place], [place], [place], [place] General Information: [email or web address]      The Personalized Prevention Plan from today's visit was reviewed, explained and discussed in detail with Rachel [name]. Medications were also reviewed today and they were reconciled with Rachel [name]. All questions were answered and no further concerns were expressed. Rachel [name] is in agreement with the current prevention plan as detailed above.    Thank you for completing your Annual Medicare Wellness visit today. See you next year!     [employer], FNP-C  Medicare Wellness Provider  [facility]  [place] [address]  [phone]  [email or web address]    BMI Categories:   Underweight = <18.5  Normal weight = 18.5-24.9   Overweight = 25-29.9   Obesity = BMI of 30 or greater     HOW TO CHOOSE A HEALTHY LIFESTYLE  1. NUTRITION and WEIGHT:   -keep track and maintain a healthy weight by eating right and being physically active  -eat a variety of foods, including vegetables (dark-green leafy and deep-yellow, and legumes), fruits, dried beans, whole grains  -limit saturated fat (dairy, meats, and oils)  -watch portion sizes  2. PHYSICAL ACTIVITY  -aerobic: 30 minutes of moderate intensity exercise on 5 days each week (brisk walking, raking leaves, house cleaning, dancing, swimming, or biking)  -10 minutes of stretching of major muscle groups to maintain range of motion  -balance training  -the more active you are, the healthier you will become   3. PREVENTION  -stay up to date on your immunizations and screenings for cancer and bone health  -stop smoking  -avoid alcohol intake  -don't use illegal (street) drugs of any kind  -avoid falls  -stay up to date with dental checkups   -stay up to date with blood pressure, cholesterol, and blood sugar checks  4. MEDICATION USE  -maintain an up-to-date medication list, including over-the-counters and herbals  5. HOME SAFETY  -assess your home regularly for falling, poisoning, fire, and suffocation hazards  6. FINANCIAL AND SOCIAL SUPPORT  -most communities have assistance available through [employer] on Aging or other municipal organizations: information can be gathered online at MarketingGang.com.ee.  7. ELDER MISTREATMENT  -report to your provider any type of physical abuse (physical, sexual, psychological, financial, neglect)  8. DRIVING  -stay up to date on your visual acuity, hearing, psychomotor skills, and cognitive screenings  9. ADVANCE DIRECTIVES AND HEALTH CARE PROXY  - Powers of  attorney, living wills, advanced directives, and guardianship documents become important if you become unable to participate in financial or health care decisions  10. MEDICARE AND PREVENTIVE VISITS  -stay up to date with your annual Medicare preventive visits, which are focused on identifying and mitigating health risks and educating you about available preventive services  11.  RESOURCES  -"The Pocket Guide to Staying Healthy at 50+": https://assets.NetMemorabilia.com.cy.pdf  -VIPDealers.com.au.  -https://ellis-tucker.biz/  -www.https://www.cervantes-rivera.net/.    -TaxHiking.com.br    -https://growyoungfitness.com/exercises-for-seniors    The [employer] Program:    The [employer] program is a Automotive engineer offered by several insurance agencies for their retirees: https://www.silversneakers.com.   SilverSneakers is the nation's leading fitness program for older adults. It offers programs and classes, which are designed specifically for all fitness levels, age levels within the Medicare population, those new to fitness, and much more.   Millions of people are eligible for the SilverSneakers benefit through leading Medicare Advantage health plans, Medicare Supplement carriers and group retiree plans. For the majority of these members, the program is available at no extra cost.   [employer] Client Relations:   -Phone: [phone]  -Email: silversneakerssales@healthways .com  Patient information: Pelvic muscle (Kegel) exercises (The Basics)   What are pelvic muscle exercises? -- Pelvic muscle exercises are exercises that strengthen the muscles that control the flow of urine and bowel movements. These exercises are also known as "Kegel" exercises. They can help keep you from leaking urine, gas, or bowel movements, if leaks are a problem for you. They can also help with a condition that  affects women called "pelvic organ prolapse." In women who have pelvic organ prolapse, the organs in the lower belly drop down and press against or bulge into the vagina.   How do I learn how to do Kegel exercises? -- First ask your doctor or nurse how to do them right. He or she can help you get started.  You will need to learn which muscles to tighten. It is sometimes hard to figure out the right muscles.   A woman might learn to do Kegel exercises by:   ?Putting a finger inside her vagina and squeezing the muscles around her finger; or   ?Pretending that she is sitting on a marble and has to pick up the marble using her vagina  A man might learn to do Kegels by tightening his butt muscles as if he were holding back gas.   Both men and women can also learn to do Kegel exercises by stopping and starting the flow of urine. If you do this, make sure to do this only once or twice to figure out the correct muscles. Some doctors think you should not do this at all, because if you get in the habit of doing it, it could cause a bladder infection.   No matter how you learn to do Kegel exercises, the important thing to know is that the muscles involved are not in your belly or your thighs.   After you learn which muscles to tighten, you can do the exercises in any position (sitting in a chair or lying down). You do not need to do them while you are in the bathroom.   How often should I do the exercises? -- Do the exercises 3 times a day, on 3 or 4 days a week. Each time, flex your muscles 8 to 12 times, and hold them tight for 6 to 8 seconds each time you tighten. Keep up this routine for at least 3 to 4 months. You will probably notice results, but it might take a little time.  How do Kegel exercises help? -- Kegel exercises can help:  ?Reduce urine leaks in people who have "stress incontinence," which means they leak urine when they cough, laugh, sneeze, or strain  ?Control sudden urges to urinate that happen  to people  with "urgency incontinence." (Urgency incontinence is also known as urge incontinence.)  ?Control the release of gas or bowel movements  ?Reduce pressure or bulging in the vagina caused by pelvic organ prolapse. (If you have a bulge in the vagina, see your doctor or nurse to find out the cause.)  Kegel exercises might also reduce urine leaks in men who have had surgery to treat prostate cancer or an enlarged prostate.   Patient information: Preventing falls (The Basics)   Am I at risk of falling? -- Your risk of falling increases as you grow older. That's because getting older can make it harder to walk steadily and keep your balance. Also, the effects of falls are more serious in older people.  Overall, 3 to 4 out of every 10 people over the age of 2 fall each year. Up to 58 percent of people who fracture a hip never recover to the point they were before they had their fracture. If you have fallen in the past, you are at higher risk of falling again.  Several things can increase your risk of a fall, including:  ?Illness  ?A change in the medicines you take  ?An unsafe or unfamiliar setting (for example, a room with rugs or furniture that might trip you, or an area you don't know well)  How can my doctor help me to avoid falling? -- Your doctor can talk to you about the following things:  ?Past falls - It is important to tell your doctor about any times you have fallen or almost fallen. He or she can then suggest ways to prevent another fall.  ?Your health conditions - Some health problems can put you at risk of falling. These include conditions that affect eyesight, hearing, muscle strength, or balance.  ?The medicines you take - Certain medicines can increase the risk of falling. These include some medicines that are used for sleeping problems, anxiety, or depression. Adding new medicines, or changing doses of some medicines, can also affect your risk of falling.  The more your doctor knows about your situation,  the better he or she will be able to help you. For example, if you fell because you have a condition that causes pain, your doctor might suggest treatments to deal with the pain. Or if one of your medicines is making you dizzy and more likely to fall, your doctor might switch you to a different medicine.  Is there anything I can do on my own? -- Yes. To help keep from falling, you can:  ?Make your home safer - To avoid falling at home, get rid of things that might make you trip or slip. This might include furniture, electrical cords, clutter, and loose rugs. Keep your home well lit so that you can easily see where you are going. Avoid storing things in high places so you don't have to reach or climb.  ?Wear sturdy shoes that fit well - Wearing shoes with high heels or slippery soles, or shoes that are too loose, can lead to falls. Walking around in bare feet, or only socks, can also increase your risk of falling.  ?Take vitamin D pills - Taking vitamin D might lower the risk of falls in older people. This is because vitamin D helps make bones and muscles stronger. Your doctor can help you decide how much vitamin D to take.  ?Stay active - Exercising on a regular basis can help lower your risk of falling. It might also help  prevent you from getting hurt if you do fall. It is best to do a few different activities that help with both strength and balance. There are many kinds of exercise that can be safe for older people. These include walking, swimming, and Tai Chi (a [place] martial art that involves slow, gentle movements).  ?Use a cane, walker, and other safety devices - If your doctor recommends that you use a cane or walker, be sure that it's the right size and you know how to use it. There are other devices that might help you avoid falling, too. These include grab bars or a sturdy seat for the shower, non-slip bath mats, and hand rails or treads for the stairs (to prevent slipping).  If you worry that you could  fall, there are also alarm buttons that let you call for help if you fall and can't get up.  What should I do if I fall? -- If you fall, see your doctor right away, even if you aren't hurt. Your doctor can try to figure out what caused you to fall, and how likely you are to fall again. He or she will do an exam and talk to you about your health problems, medicines, and activities. Then he or she can suggest things you can do to avoid falling again.  Many older people have a hard time recovering after a fall. Doing things to prevent falling can help you to protect your health and independence.

## 2023-03-22 ENCOUNTER — Other Ambulatory Visit: Payer: Self-pay

## 2023-03-22 ENCOUNTER — Encounter (HOSPITAL_BASED_OUTPATIENT_CLINIC_OR_DEPARTMENT_OTHER): Payer: Self-pay | Admitting: Internal Medicine

## 2023-03-22 ENCOUNTER — Encounter (HOSPITAL_BASED_OUTPATIENT_CLINIC_OR_DEPARTMENT_OTHER): Payer: Self-pay | Admitting: Family

## 2023-03-22 ENCOUNTER — Ambulatory Visit (HOSPITAL_BASED_OUTPATIENT_CLINIC_OR_DEPARTMENT_OTHER): Payer: Medicare PPO | Admitting: Internal Medicine

## 2023-03-22 ENCOUNTER — Ambulatory Visit: Payer: Medicare PPO | Attending: Family | Admitting: Family

## 2023-03-22 VITALS — BP 142/78 | HR 90 | Temp 97.5°F | Ht 63.74 in | Wt 114.6 lb

## 2023-03-22 VITALS — BP 142/84 | HR 90 | Temp 97.5°F | Ht 63.74 in | Wt 114.6 lb

## 2023-03-22 DIAGNOSIS — J309 Allergic rhinitis, unspecified: Secondary | ICD-10-CM

## 2023-03-22 DIAGNOSIS — Z0000 Encounter for general adult medical examination without abnormal findings: Secondary | ICD-10-CM | POA: Insufficient documentation

## 2023-03-22 DIAGNOSIS — E46 Unspecified protein-calorie malnutrition: Secondary | ICD-10-CM | POA: Insufficient documentation

## 2023-03-22 DIAGNOSIS — D320 Benign neoplasm of cerebral meninges: Secondary | ICD-10-CM | POA: Insufficient documentation

## 2023-03-22 DIAGNOSIS — F39 Unspecified mood [affective] disorder: Secondary | ICD-10-CM | POA: Insufficient documentation

## 2023-03-22 DIAGNOSIS — M47892 Other spondylosis, cervical region: Secondary | ICD-10-CM | POA: Insufficient documentation

## 2023-03-22 DIAGNOSIS — R1310 Dysphagia, unspecified: Secondary | ICD-10-CM

## 2023-03-22 DIAGNOSIS — M159 Polyosteoarthritis, unspecified: Secondary | ICD-10-CM

## 2023-03-22 DIAGNOSIS — D692 Other nonthrombocytopenic purpura: Secondary | ICD-10-CM | POA: Insufficient documentation

## 2023-03-22 DIAGNOSIS — G4486 Cervicogenic headache: Secondary | ICD-10-CM | POA: Insufficient documentation

## 2023-03-22 DIAGNOSIS — R55 Syncope and collapse: Secondary | ICD-10-CM | POA: Insufficient documentation

## 2023-03-22 DIAGNOSIS — N3010 Interstitial cystitis (chronic) without hematuria: Secondary | ICD-10-CM

## 2023-03-22 DIAGNOSIS — R413 Other amnesia: Secondary | ICD-10-CM | POA: Insufficient documentation

## 2023-03-22 MED ORDER — AZELASTINE 137 MCG (0.1 %) NASAL SPRAY
1.0000 | Freq: Two times a day (BID) | NASAL | 1 refills | Status: DC
Start: 2023-03-22 — End: 2023-07-17

## 2023-03-22 MED ORDER — FEXOFENADINE 180 MG TABLET
180.0000 mg | ORAL_TABLET | Freq: Every day | ORAL | Status: DC
Start: 2023-03-22 — End: 2023-05-21

## 2023-03-22 NOTE — Progress Notes (Signed)
INTERNAL MEDICINE, [place]  [place]  [place] [address]  Operated by [facility]  Progress Note    Name: Rachel [name] MRN:  [id number]   Date: 03/22/2023 DOB:  1941/06/02 (82 y.o.)           Chief Complaint: Follow Up    Subjective:     Urogyn started elavil and vagifem    Congestion, sinus infection for around 4 weeks  Taking Flonase BID and dexilant, allegra  Not seeing any dark yellow/green mucus  She is taking headache and sinus tablets  When bending forward she does not have tooth pain and it actually helps improve her congestion    Chest pain since implant removal     Fluoroscopy scheduled 04/26/23    Started on tizanidine instead of Requip, making her very tired    Will get Tdap at the pharmacy    Objective :  BP (!) 142/84 (Site: Left, Patient Position: Sitting, Cuff Size: Adult)   Pulse 90   Temp 36.4 C (97.5 F) (Temporal)   Ht 1.619 m (5' 3.74")   Wt 52 kg (114 lb 10.2 oz)   SpO2 97%   BMI 19.84 kg/m       Gen; NAD  HEENT: TM normal b/l, narrowed nares, + cobblestoning, no LAD  Heart; RRR, no murmurs  Lungs CTABL  Abd: soft/nd/nt  Ext: no c/e/e    Data reviewed:    Current Outpatient Medications   Medication Sig    amitriptyline (ELAVIL) 10 mg Oral Tablet Take 1 Tablet (10 mg total) by mouth Every night    azelastine (ASTELIN) 137 mcg (0.1 %) Nasal Aerosol, Spray Administer 1 Spray into each nostril Twice daily Use in each nostril as directed Indications: seasonal runny nose    dexlansoprazole (DEXILANT) 30 mg Oral Cap, Delayed Rel., Multiphasic Take 1 Capsule (30 mg total) by mouth Once a day Indications: gastroesophageal reflux disease, failed protonix, pepcid, nexium    DULoxetine (CYMBALTA DR) 60 mg Oral Capsule, Delayed Release(E.C.) Take 1 Capsule (60 mg total) by mouth Once a day    estradiol (VAGIFEM) vaginal tablet Insert 1 Tablet (10 mcg total) into the vagina Every MON and THURS    fexofenadine (ALLEGRA) 180 mg Oral Tablet Take 1 Tablet  (180 mg total) by mouth Once a day    fluticasone propionate (FLONASE) 50 mcg/actuation Nasal Spray, Suspension SHAKE LIQUID AND USE 1 SPRAY IN EACH NOSTRIL TWICE DAILY    ondansetron (ZOFRAN ODT) 4 mg Oral Tablet, Rapid Dissolve Take 1 Tablet (4 mg total) by mouth Every 8 hours as needed for Nausea/Vomiting    Oxycodone (ROXICODONE) 10 mg Oral Tablet Take 1 Tablet (10 mg total) by mouth Every 6 hours as needed for Pain for up to 30 days Indications: pain, osteoarthritis    sumatriptan succinate (IMITREX) 100 mg Oral Tablet Take 1 Tablet (100 mg total) by mouth Once, as needed for Migraine for up to 12 doses May repeat in 2 hours in needed    tiZANidine (ZANAFLEX) 2 mg Oral Tablet Take 1 Tablet (2 mg total) by mouth Every night     Assessment/Plan  Problem List Items Addressed This Visit          Neurologic    Cervicogenic headache       Digestive    Dysphagia       Musculoskeletal    Osteoarthritis     Other Visit Diagnoses  Allergic rhinitis    -  Primary    flonase BID  allegra daily  start astelin nasal spray    Interstitial cystitis        improvement with elavil 10mg   f/u with urogyn 4/11          Total time 27 mins    [name], MD

## 2023-03-22 NOTE — Nursing Note (Signed)
03/22/23 1347   Medicare Wellness Assessment   Medicare initial or wellness physical in the last year? Yes   Advance Directives   Does patient have a living will or MPOA Yes   Activities of Daily Living   Do you need help with dressing, bathing, or walking? No   Do you need help with shopping, housekeeping, medications, or finances? Yes   Do you have rugs in hallways, broken steps, or poor lighting? No   Do you have grab bars in your bathroom, non-slip strips in your tub, and hand rails on your stairs? Yes   Cognitive Function Screen   What is you age? 1   What is the time to the nearest hour? 1   What is the year? 1   What is the name of this clinic? 1   Can the patient recognize two persons (the doctor, the nurse, home help, etc.)? 1   What is the date of your birth? (day and month sufficient)  1   In what year did World War II end? 0   Who is the current president of the [place]? 1   Count from 20 down to 1? 1   What address did I give you earlier? 0   Total Score 8   Depression Screen   Little interest or pleasure in doing things. 0   Feeling down, depressed, or hopeless 0   PHQ 2 Total 0   Pain Score   Pain Score Five   Substance Use Screening   In Past 12 MONTHS, how often have you used any tobacco product (for example, cigarettes, e-cigarettes, cigars, pipes, or smokeless tobacco)? Never   In the PAST 12 MONTHS, how often have you had 5 (men)/4 (women) or more drinks containing alcohol in one day? Never   In the PAST 12 months, how often have you used any prescription medications just for the feeling, more than prescribed, or that were not prescribed for you? Prescriptions may include: opioids, benzodiazepines, medications for ADHD Never   In the PAST 12 MONTHS, how often have you used any drugs, including marijuana, cocaine or crack, heroin, methamphetamine, hallucinogens, ecstasy/MDMA? Never   Fall Risk Assessment   Do you feel unsteady when standing or walking? Yes   Do you worry about falling?  Yes   Have you fallen in the past year? Yes   How many times have you fallen? Once   Were you ever injured from falling? No   Urinary Incontinence Screen   Do you ever leak urine when you don't want to? YES     Eye Exam:   Right Eye:20/40  Left Eye:20/30      Orpah Melter, RN

## 2023-04-04 ENCOUNTER — Other Ambulatory Visit: Payer: Self-pay

## 2023-04-04 ENCOUNTER — Ambulatory Visit: Payer: Medicare PPO | Attending: OBSTETRICS/GYNECOLOGY | Admitting: OBSTETRICS/GYNECOLOGY

## 2023-04-04 ENCOUNTER — Other Ambulatory Visit (HOSPITAL_BASED_OUTPATIENT_CLINIC_OR_DEPARTMENT_OTHER): Payer: Self-pay | Admitting: OBSTETRICS/GYNECOLOGY

## 2023-04-04 ENCOUNTER — Encounter (HOSPITAL_BASED_OUTPATIENT_CLINIC_OR_DEPARTMENT_OTHER): Payer: Self-pay | Admitting: OBSTETRICS/GYNECOLOGY

## 2023-04-04 VITALS — BP 112/72 | HR 72 | Temp 96.1°F | Ht 63.0 in | Wt 114.6 lb

## 2023-04-04 DIAGNOSIS — N3010 Interstitial cystitis (chronic) without hematuria: Secondary | ICD-10-CM | POA: Insufficient documentation

## 2023-04-04 MED ORDER — AMITRIPTYLINE 10 MG TABLET
10.0000 mg | ORAL_TABLET | Freq: Every evening | ORAL | 3 refills | Status: DC
Start: 2023-04-04 — End: 2023-07-26

## 2023-04-04 MED ORDER — ESTRADIOL 10 MCG VAGINAL TABLET
10.0000 ug | ORAL_TABLET | VAGINAL | 3 refills | Status: DC
Start: 2023-04-04 — End: 2023-07-17

## 2023-04-04 NOTE — Progress Notes (Signed)
Chief Complaint   Patient presents with    Interstitial Cystitis       History of present illness:  The patient is 82 y.o. female who presents for follow-up of interstitial cystitis. Patient started Elavil 10 mg daily and Vagifem 10 mcg 2x weekly at last visit, 01/03/2023. She reports that her urinary symptoms are much improved on these medications. Patient denies any complaints or concerns at this time.      Past Medical History:   Diagnosis Date    Chronic pain     Depression     GERD (gastroesophageal reflux disease)     Low back pain     Memory changes     Migraine     Neck problem     Osteoarthritis     Shortness of breath     Wears glasses            Past Surgical History:   Procedure Laterality Date    ANTERIOR FUSION CERVICAL SPINE  12/30/2020    BREAST IMPLANT REMOVAL Bilateral     2022    CYSTOURETHROSCOPY      FOOT SURGERY Bilateral     HX BREAST AUGMENTATION Bilateral     x3, 1974, 1994, 2014    HX CATARACT REMOVAL Bilateral 01/12/2021    HX CERVICAL SPINE SURGERY      HX ROTATOR CUFF REPAIR Right     SACROILIAC JOINT INJECTION      TOE AMPUTATION             Current Outpatient Medications   Medication Sig    amitriptyline (ELAVIL) 10 mg Oral Tablet Take 1 Tablet (10 mg total) by mouth Every night    azelastine (ASTELIN) 137 mcg (0.1 %) Nasal Aerosol, Spray Administer 1 Spray into each nostril Twice daily Use in each nostril as directed Indications: seasonal runny nose    dexlansoprazole (DEXILANT) 30 mg Oral Cap, Delayed Rel., Multiphasic Take 1 Capsule (30 mg total) by mouth Once a day Indications: gastroesophageal reflux disease, failed protonix, pepcid, nexium    DULoxetine (CYMBALTA DR) 60 mg Oral Capsule, Delayed Release(E.C.) Take 1 Capsule (60 mg total) by mouth Once a day    estradiol (VAGIFEM) vaginal tablet Insert 1 Tablet (10 mcg total) into the vagina Every MON and THURS    fexofenadine (ALLEGRA) 180 mg Oral Tablet Take 1 Tablet (180 mg total) by mouth Once a day    fluticasone propionate  (FLONASE) 50 mcg/actuation Nasal Spray, Suspension SHAKE LIQUID AND USE 1 SPRAY IN EACH NOSTRIL TWICE DAILY    ondansetron (ZOFRAN ODT) 4 mg Oral Tablet, Rapid Dissolve Take 1 Tablet (4 mg total) by mouth Every 8 hours as needed for Nausea/Vomiting    Oxycodone (ROXICODONE) 10 mg Oral Tablet Take 1 Tablet (10 mg total) by mouth Every 6 hours as needed for Pain for up to 30 days Indications: pain, osteoarthritis    sumatriptan succinate (IMITREX) 100 mg Oral Tablet Take 1 Tablet (100 mg total) by mouth Once, as needed for Migraine for up to 12 doses May repeat in 2 hours in needed    tiZANidine (ZANAFLEX) 2 mg Oral Tablet Take 1 Tablet (2 mg total) by mouth Every night (Patient not taking: Reported on 04/04/2023)       Allergies   Allergen Reactions    Latex     Darvon [Propoxyphene] Mental Status Effect     Hallucinations    Poison Ivy Extract     Shellfish Derived  Social History     Socioeconomic History    Marital status: Married     Spouse name: Radio producer    Number of children: 2    Years of education: Not on file    Highest education level: Not on file   Occupational History    Occupation: Retired   Tobacco Use    Smoking status: Never    Smokeless tobacco: Never   Vaping Use    Vaping status: Never Used   Substance and Sexual Activity    Alcohol use: Not Currently    Drug use: Not Currently     Frequency: 5.0 times per week     Types: Marijuana     Comment: medical marijuana gel    Sexual activity: Not Currently     Partners: Male   Other Topics Concern    Ability to Walk 1 Flight of Steps without SOB/CP Yes    Routine Exercise No    Ability to Walk 2 Flight of Steps without SOB/CP Not Asked    Unable to Ambulate Not Asked    Total Care Not Asked    Ability To Do Own ADL's Yes    Uses Walker No    Other Activity Level Yes     Comment: light house work    Emergency planning/management officer Yes   Social History Narrative    Right handed; married for 47 years; two daughters (fraternal twins).  Two grandson's and one adopted grandson.       Originally from [place], [place].  Previously lived in [place] for 25 years ([place] and [place]) prior to coming back to [place] in late 2022.     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         Twins are to her first husband (was married for 3 years).         Enjoyed needlepoint, knitting, golf, tennis, gardening, cooking (does not do any of them now)        Daughter's both live in [place], [place].          Social Determinants of Psychologist, prison and probation services Strain: Not on file   Transportation Needs: Not on file   Social Connections: Not on file   Intimate Partner Violence: Not on file   Housing Stability: Not on file       Family Medical History:       Problem Relation (Age of Onset)    Arthritis-osteo Sister    Food Allergy Mother, Sister    No Known Problems Father, Maternal Grandfather, Paternal Grandmother, Paternal Grandfather, Daughter    Pneumonia Mother, Maternal Grandmother    Psoriasis Daughter              REVIEW OF SYSTEMS  All 10 systems were reviewed. All systems are negative except those noted in the HPI.    PHYSICAL EXAMINATION:   Well-developed, well-nourished female in no apparent distress.  Neck is supple  Respiratory examination reveals no respiratory distress and comfortable breathing on room air.  Cardiac examination reveals extremities well-perfused.  Skin is normal  Musculoskeletal and neurological examinations are within normal limits      ASSESSMENT:   Assessment/Plan   1. Interstitial cystitis         PLAN:   Refills provided for Elavil and Vagifem.  It was explained to the patient that use of hormonal therapy is thought to increase the  risk of breast cancer, heart attacks, strokes, and blood clots, although the actual incidence of a such an event remains quite low.  The dose, form and regimen of hormonal medication were determined based on the patient's requirements, age and reason for therapy. The benefits and alternatives to  hormonal medication were reviewed with the patient.  The patient expressed an understanding and all questions were answered prior initiating treatment.   Side effect profile and potential risks of antidepressant medication were reviewed with the patient.  These include but are not necessarily limited to:  Headache, upset stomach, bleeding problems, weight gain, decreased libido, and possible suicidal thinking.  The patient was advised to call immediately upon developing any of the above side effects.   The patient expressed an understanding and all questions were answered.    F/u in 1 year or prn.    Prescriptions filled:   Medication: Elavil, oral tablet    Dosage: 10 mg, 1x nightly      Medication: Vagifem, vaginal tablet   Dosage: 10 mcg, 2x weekly.    Orders Placed This Encounter    amitriptyline (ELAVIL) 10 mg Oral Tablet    estradiol (VAGIFEM) vaginal tablet      Patient knows to call the office or the [facility] if they have any other problems in the interim.    I am scribing for, and in the presence of, Dr. Demetrius Revel, MD for services provided on 04/04/2023.  Avelino Leeds, SCRIBE   [place], SCRIBE  04/04/2023, 11:51      I personally performed the services described in this documentation, as scribed  in my presence, and it is both accurate  and complete.    Urban Gibson, MD

## 2023-04-17 ENCOUNTER — Other Ambulatory Visit (HOSPITAL_BASED_OUTPATIENT_CLINIC_OR_DEPARTMENT_OTHER): Payer: Self-pay | Admitting: Internal Medicine

## 2023-04-17 DIAGNOSIS — M1990 Unspecified osteoarthritis, unspecified site: Secondary | ICD-10-CM

## 2023-04-17 MED ORDER — OXYCODONE 10 MG TABLET
10.0000 mg | ORAL_TABLET | Freq: Four times a day (QID) | ORAL | 0 refills | Status: DC | PRN
Start: 2023-04-17 — End: 2023-05-21

## 2023-04-26 ENCOUNTER — Inpatient Hospital Stay
Admission: RE | Admit: 2023-04-26 | Discharge: 2023-04-26 | Disposition: A | Discharge status: Home / Self Care | Payer: Medicare PPO | Source: Ambulatory Visit | Attending: Internal Medicine | Admitting: Internal Medicine

## 2023-04-26 ENCOUNTER — Other Ambulatory Visit: Payer: Self-pay

## 2023-04-26 DIAGNOSIS — R1310 Dysphagia, unspecified: Secondary | ICD-10-CM | POA: Insufficient documentation

## 2023-04-26 DIAGNOSIS — K219 Gastro-esophageal reflux disease without esophagitis: Secondary | ICD-10-CM

## 2023-04-26 MED ORDER — BARIUM SULFATE 60 % (W/V) ORAL SUSPENSION
30.0000 mL | ORAL | Status: AC
Start: 2023-04-26 — End: 2023-04-26
  Administered 2023-04-26: 30 mL via ORAL

## 2023-04-26 MED ORDER — BARIUM SULFATE 98 % ORAL POWDER FOR SUSPENSION
60.0000 mL | INHALATION_SUSPENSION | ORAL | Status: AC
Start: 2023-04-26 — End: 2023-04-26
  Administered 2023-04-26: 60 mL via ORAL

## 2023-04-26 MED ORDER — BARIUM SULFATE 700 MG TABLET
1.0000 | ORAL_TABLET | ORAL | Status: AC
Start: 2023-04-26 — End: 2023-04-26
  Administered 2023-04-26: 1 via ORAL

## 2023-04-26 MED ORDER — SOD BICARB-CITRIC AC-SIMETH 2.21 GRAM-1.53 GRAM/4 GRAM GRANULES EFFERV
1.0000 | GRANULES | ORAL | Status: AC
Start: 2023-04-26 — End: 2023-04-26
  Administered 2023-04-26: 1 via ORAL

## 2023-05-21 ENCOUNTER — Other Ambulatory Visit (HOSPITAL_BASED_OUTPATIENT_CLINIC_OR_DEPARTMENT_OTHER): Payer: Self-pay | Admitting: Internal Medicine

## 2023-05-21 DIAGNOSIS — M1990 Unspecified osteoarthritis, unspecified site: Secondary | ICD-10-CM

## 2023-05-21 MED ORDER — OXYCODONE 10 MG TABLET
10.0000 mg | ORAL_TABLET | Freq: Four times a day (QID) | ORAL | 0 refills | Status: DC | PRN
Start: 2023-05-21 — End: 2023-06-18

## 2023-05-21 MED ORDER — FEXOFENADINE 180 MG TABLET
180.0000 mg | ORAL_TABLET | Freq: Every day | ORAL | 3 refills | Status: DC
Start: 2023-05-21 — End: 2023-07-17

## 2023-05-21 MED ORDER — DEXLANSOPRAZOLE 30 MG CAPSULE,BIPHASE DELAYED RELEASE
30.0000 mg | DELAYED_RELEASE_CAPSULE | Freq: Every day | ORAL | 0 refills | Status: DC
Start: 2023-05-21 — End: 2023-07-17

## 2023-05-21 MED ORDER — TIZANIDINE 2 MG TABLET
2.0000 mg | ORAL_TABLET | Freq: Every evening | ORAL | 3 refills | Status: DC
Start: 2023-05-21 — End: 2023-10-31

## 2023-05-21 NOTE — Telephone Encounter (Signed)
Regarding: med refill  ----- Message from Malena Peer sent at 05/21/2023  9:04 AM EDT -----  Doctor Name:  Conception Oms, MD       Date of last appointment: 03/22/23    Next scheduled visit: none    Medication Requested:     Oxycodone (ROXICODONE) 10 mg Oral Tablet    fexofenadine (ALLEGRA) 180 mg Oral Tablet    tiZANidine (ZANAFLEX) 2 mg Oral Tablet    dexlansoprazole (DEXILANT) 30 mg Oral Cap, Delayed Rel., Multiphasic    Preferred Pharmacy     [facility]ORE [phone] - Kimberlee Nearing, [place] - [address] AT   [facility] OF [address]    [address] [facility] [address]    Phone: [phone] Fax: [phone]    Hours: Not open 24 hours          Notes for Nurse or Physician: Pt would like a call when these are sent so she knows when to pick them up. Thank you!

## 2023-06-03 ENCOUNTER — Telehealth (HOSPITAL_BASED_OUTPATIENT_CLINIC_OR_DEPARTMENT_OTHER): Payer: Self-pay | Admitting: Internal Medicine

## 2023-06-03 NOTE — Telephone Encounter (Signed)
Received fax from [facility] stating dex lansoprazole is not covered by patient insurance. Preferred alternatives are pantoprazole, omeprazole, or Rabeprazole. According to patient chart, they have not tried any of the preferred medications. Please advise. Ronnald Ramp, MA

## 2023-06-18 ENCOUNTER — Ambulatory Visit (HOSPITAL_BASED_OUTPATIENT_CLINIC_OR_DEPARTMENT_OTHER): Payer: Self-pay

## 2023-06-18 DIAGNOSIS — M1990 Unspecified osteoarthritis, unspecified site: Secondary | ICD-10-CM

## 2023-06-18 NOTE — Telephone Encounter (Addendum)
Message from Berna Spare, RN sent at 06/18/2023  9:53 AM EDT   Regarding: FW: refill  Message from Sherre Lain sent at 06/18/2023  9:52 AM EDT   Doctor Name:  Dr. Allean Found                                  Date of last appointment: 03/22/23    Next scheduled visit: 07/26/23    Medication Requested:   Oxycodone (ROXICODONE) 10 mg Oral Tablet 120 Tablet05/28/20246/27/2024   Sig - Route: Take 1 Tablet (10 mg total) by mouth Every 6 hours as needed for Pain for up to 30 days Indications: pain, osteoarthritis - Oral   Sent to pharmacy as: oxyCODONE 10 mg tablet (ROXICODONE)   Class: E-Rx   Earliest Fill Date: 05/21/2023   Non-formulary Exception Code: RXHUB/No Formulary Info Available   E-Prescribing Status: Receipt confirmed by pharmacy (05/21/2023  1:04 PM EDT)       Medication issues or side effects that need reported to nurse or physician:     Preferred Pharmacy: Preferred Pharmacy    [facility] DRUG STORE [phone] - [place]lee Nearing, [place] - [address] AT   [facility] OF [address]   [address] [facility] [place] [address]   Phone: [phone] Fax: [phone]   Hours: Not open 24 hours      Notes for Nurse or Physician:

## 2023-06-21 MED ORDER — OXYCODONE 10 MG TABLET
10.0000 mg | ORAL_TABLET | Freq: Four times a day (QID) | ORAL | 0 refills | Status: DC | PRN
Start: 2023-06-21 — End: 2023-07-17

## 2023-06-25 ENCOUNTER — Other Ambulatory Visit: Payer: Self-pay

## 2023-06-25 ENCOUNTER — Encounter (INDEPENDENT_AMBULATORY_CARE_PROVIDER_SITE_OTHER): Payer: Self-pay | Admitting: PLASTIC AND RECONSTRUCTIVE SURGERY

## 2023-06-25 ENCOUNTER — Ambulatory Visit: Payer: Medicare PPO | Attending: PLASTIC AND RECONSTRUCTIVE SURGERY | Admitting: PLASTIC AND RECONSTRUCTIVE SURGERY

## 2023-06-25 VITALS — BP 144/72 | HR 92 | Temp 97.0°F | Ht 63.0 in | Wt 119.5 lb

## 2023-06-25 DIAGNOSIS — T8549XD Other mechanical complication of breast prosthesis and implant, subsequent encounter: Secondary | ICD-10-CM

## 2023-06-25 DIAGNOSIS — Z9882 Breast implant status: Secondary | ICD-10-CM

## 2023-06-25 DIAGNOSIS — N644 Mastodynia: Secondary | ICD-10-CM

## 2023-06-25 NOTE — H&P (Signed)
[facility]  DEPARTMENT OF PLASTIC & RECONSTRUCTIVE SURGERY  CLINIC H&P    Name: Rachel [name], 82 y.o. female  MRN: [id number]  Date of Birth: 27-Feb-1941  Date of Service: 06/25/2023    Chief Complaint:    Chief Complaint   Patient presents with    Follow Up     History of Present Illness: Rachel [name] is a 82 y.o. female presenting to the clinic for re-establishment of care. The patient has a history of bilateral breast implants placed in 1974 that subsequently ruptured and were replaced in 1994. She initially presented to plastic surgery September of 09/18/2021 where she complained of bilateral breast pain and was taken to the OR shortly after on 01/23/2021 for removal of bilateral saline implants. She states that since then, she has had progressively worsening bilateral breast pain. She states that over the last few months it has gotten significantly worse despite her daily pain medications. She denies any recent trauma or surgeries. She denies noticing any new masses or nipple discharge     Past Medical History:  Past Medical History:   Diagnosis Date    Chronic pain     Depression     GERD (gastroesophageal reflux disease)     Low back pain     Memory changes     Migraine     Neck problem     Osteoarthritis     Shortness of breath     Wears glasses      Past Surgical History:  Past Surgical History:   Procedure Laterality Date    Anterior fusion cervical spine  12/30/2020    Breast implant removal Bilateral     Cystourethroscopy      Foot surgery Bilateral     Hx breast augmentation Bilateral     Hx cataract removal Bilateral 01/12/2021    Hx cervical spine surgery      Hx rotator cuff repair Right     Sacroiliac joint injection      Toe amputation       Medications:  Outpatient Medications Marked as Taking for the 06/25/23 encounter (Office Visit) with Leanne Chang, MD   Medication Sig    azelastine (ASTELIN) 137 mcg (0.1 %) Nasal Aerosol, Spray Administer 1 Spray into each nostril Twice daily Use  in each nostril as directed Indications: seasonal runny nose    DULoxetine (CYMBALTA DR) 60 mg Oral Capsule, Delayed Release(E.C.) Take 1 Capsule (60 mg total) by mouth Once a day    estradiol (VAGIFEM) vaginal tablet Insert 1 Tablet (10 mcg total) into the vagina Every MON and THURS    fexofenadine (ALLEGRA) 180 mg Oral Tablet Take 1 Tablet (180 mg total) by mouth Once a day    fluticasone propionate (FLONASE) 50 mcg/actuation Nasal Spray, Suspension SHAKE LIQUID AND USE 1 SPRAY IN EACH NOSTRIL TWICE DAILY    Oxycodone (ROXICODONE) 10 mg Oral Tablet Take 1 Tablet (10 mg total) by mouth Every 6 hours as needed for Pain for up to 30 days Indications: pain, osteoarthritis    tiZANidine (ZANAFLEX) 2 mg Oral Tablet Take 1 Tablet (2 mg total) by mouth Every night      Family History:  Family Medical History:       Problem Relation (Age of Onset)    Arthritis-osteo Sister    Food Allergy Mother, Sister    No Known Problems Father, Maternal Grandfather, Paternal Grandmother, Paternal Grandfather, Daughter    Pneumonia Mother, Maternal Grandmother  Psoriasis Daughter          Social History:  Social History     Occupational History    Occupation: Retired   Tobacco Use    Smoking status: Never    Smokeless tobacco: Never   Vaping Use    Vaping status: Never Used   Substance and Sexual Activity    Alcohol use: Not Currently    Drug use: Not Currently     Frequency: 5.0 times per week     Types: Marijuana     Comment: medical marijuana gel    Sexual activity: Not Currently     Partners: Male     Allergies:  Allergies   Allergen Reactions    Latex     Darvon [Propoxyphene] Mental Status Effect     Hallucinations    Poison Ivy Extract     Shellfish Derived      Review of Systems:                                                          All other systems reviewed and found to be negative.    Physical Exam:  BP (Non-Invasive): (!) 144/72 Temperature: 36.1 C (97 F) Heart Rate: 92   SpO2: 95 %  Height: 160 cm (5\' 3" ) Weight:  54.2 kg (119 lb 7.8 oz) Body mass index is 21.17 kg/m.    General Appearance: Pleasant, cooperative, healthy, and in no acute distress.  Eyes: Conjunctivae/corneas clear, PERR  Pinnae: Normal shape and position.   Nose: External pyramid midline. Septum midline. Mucosa normal.  Oral Cavity/Oropharynx: No mucosal lesions, masses, or pharyngeal asymmetry.  Lungs: Clear to auscultation. Equal work of breathing, no respiratory distress  Cardio: Normal rate and rhythm  Breasts: Bilateral breasts ptotic, pain with palpation bilaterally over both breasts, most notable over left lateral breast, lateral to left nipple, mild palpable scar tissue   Skin: Skin warm and dry.  Psychiatric: Alert and oriented x 3.    Review of Information:  I have reviewed previous operative notes from Dr. Doristine Section     Assessment:   Rachel [name] is a 82 y.o. female who presents for re-establishment of care due to worsening bilateral breast pain after bilateral implant removal 10/23/2021      ICD-10-CM    1. Breast pain  N64.4         Plan:  - Discussed no PLS surgical intervention at this time   - Will place referral to breast clinic for evaluation and possible ultrasound/workup to rule out any pathology associated with bilateral worsening breast pain after implant removal  - Will plan to see patient back after work up to discuss any further intervention she may need going forward including possible capsulotomy vs. capsulectomy down the line     This patient was seen with Dr. Pearson Forster.     Marcelle Smiling, PA-C  06/25/2023, 12:55  Department of Plastic & Reconstructive Surgery     Leanne Chang, MD    CC:    PCP No Pcp  No address on file     Referring Provider No referring provider defined for this encounter.        I personally saw and examined the patient. See Midlevel's note for additional details.    Leanne Chang, MD 06/27/2023, 11:13

## 2023-07-17 ENCOUNTER — Other Ambulatory Visit (HOSPITAL_BASED_OUTPATIENT_CLINIC_OR_DEPARTMENT_OTHER): Payer: Self-pay | Admitting: OBSTETRICS/GYNECOLOGY

## 2023-07-17 ENCOUNTER — Other Ambulatory Visit (HOSPITAL_BASED_OUTPATIENT_CLINIC_OR_DEPARTMENT_OTHER): Payer: Self-pay | Admitting: Internal Medicine

## 2023-07-17 ENCOUNTER — Other Ambulatory Visit (HOSPITAL_BASED_OUTPATIENT_CLINIC_OR_DEPARTMENT_OTHER): Payer: Self-pay | Admitting: Obstetrics & Gynecology

## 2023-07-17 ENCOUNTER — Other Ambulatory Visit (HOSPITAL_BASED_OUTPATIENT_CLINIC_OR_DEPARTMENT_OTHER): Payer: Self-pay | Admitting: Student in an Organized Health Care Education/Training Program

## 2023-07-17 DIAGNOSIS — N3010 Interstitial cystitis (chronic) without hematuria: Secondary | ICD-10-CM

## 2023-07-17 DIAGNOSIS — F39 Unspecified mood [affective] disorder: Secondary | ICD-10-CM

## 2023-07-17 DIAGNOSIS — M1990 Unspecified osteoarthritis, unspecified site: Secondary | ICD-10-CM

## 2023-07-17 MED ORDER — ONDANSETRON 4 MG DISINTEGRATING TABLET
4.0000 mg | ORAL_TABLET | Freq: Three times a day (TID) | ORAL | 0 refills | Status: DC | PRN
Start: 2023-07-17 — End: 2024-06-30

## 2023-07-17 MED ORDER — SUMATRIPTAN 100 MG TABLET
100.0000 mg | ORAL_TABLET | Freq: Once | ORAL | 0 refills | Status: DC | PRN
Start: 2023-07-17 — End: 2023-10-02

## 2023-07-17 MED ORDER — AZELASTINE 137 MCG (0.1 %) NASAL SPRAY
1.0000 | Freq: Two times a day (BID) | NASAL | 1 refills | Status: DC
Start: 2023-07-17 — End: 2024-02-11

## 2023-07-17 MED ORDER — OXYCODONE 10 MG TABLET
10.0000 mg | ORAL_TABLET | Freq: Four times a day (QID) | ORAL | 0 refills | Status: DC | PRN
Start: 2023-07-17 — End: 2023-08-23

## 2023-07-17 MED ORDER — DULOXETINE 60 MG CAPSULE,DELAYED RELEASE
60.0000 mg | DELAYED_RELEASE_CAPSULE | Freq: Every day | ORAL | 3 refills | Status: DC
Start: 2023-07-17 — End: 2024-03-02

## 2023-07-17 MED ORDER — ESTRADIOL 10 MCG VAGINAL TABLET
10.0000 ug | ORAL_TABLET | VAGINAL | 3 refills | Status: DC
Start: 2023-07-18 — End: 2023-07-26

## 2023-07-17 MED ORDER — DEXLANSOPRAZOLE 30 MG CAPSULE,BIPHASE DELAYED RELEASE
30.0000 mg | DELAYED_RELEASE_CAPSULE | Freq: Every day | ORAL | 3 refills | Status: DC
Start: 2023-07-17 — End: 2024-03-05

## 2023-07-17 MED ORDER — FEXOFENADINE 180 MG TABLET
180.0000 mg | ORAL_TABLET | Freq: Every day | ORAL | 3 refills | Status: DC
Start: 2023-07-17 — End: 2023-11-09

## 2023-07-17 NOTE — Telephone Encounter (Signed)
Medical Group Practice  [address]  [place], [place] [address]     PATIENT NAME: Rachel [name]   MRN: [id number]  DOB: 06/10/1941     7.24.2024  12:40    Prior authorization for Dexlansoprazole (Dexilant) 30 mg capsules started through [employer]. Placing in your MGP UTC box for review.     Maudry Mayhew, BSN RN  Clinical Nurse Coordinator

## 2023-07-17 NOTE — Telephone Encounter (Signed)
Order Providers    Prescribing Provider Encounter Provider   Conception Oms, MD Conception Oms, MD     Outpatient Medication Detail     Disp Refills Start End    tiZANidine (ZANAFLEX) 2 mg Oral Tablet 90 Tablet 3 05/21/2023 --    Sig - Route: Take 1 Tablet (2 mg total) by mouth Every night - Oral    Sent to pharmacy as: tiZANidine 2 mg tablet (ZANAFLEX)    Class: E-Rx    Non-formulary Exception Code: RXHUB/No Formulary Info Available    E-Prescribing Status: Receipt confirmed by pharmacy (05/21/2023  1:04 PM EDT)      Pharmacy    [facility] #[id number] [place],  - [address] AT [facility] [address] [facility]          Medical Group Practice  [address]  [place], [place] [address]     PATIENT NAME: Rachel [name]. Knappenberger   MRN: [id number]  DOB: [birth year]     7.24.2024  07:40    Ms. Mears is scheduled to re-establish care on July 26, 2023.     Maudry Mayhew, BSN RN  Clinical Nurse Coordinator

## 2023-07-17 NOTE — Telephone Encounter (Signed)
Medical Group Practice  [address]  [place], [place] [address]     PATIENT NAME: Rachel [name]   MRN: [id number]  DOB: November 11, 1941     7.24.2024  07:43    [name] is scheduled to re-establish care on July 26, 2023.     Maudry Mayhew, BSN RN  Clinical Nurse Coordinator

## 2023-07-17 NOTE — Telephone Encounter (Signed)
Medical Group Practice  [address]  [place], [place] [address]     PATIENT NAME: Rachel [name]. Kemmerling   MRN: [id number]  DOB: [birth year]     7.24.2024  11:10    Received a drug change request for Dexlansoprazole 30 MG capsules from [facility].    "Drug not covered by patient plan. The preferred alternative is PANTOPRAZOLE SODIUM, OMEPRAZOLE, RABEPRAZOLE SODIUM. Please call / fax the pharmacy to change medication along with strength, directions, quantity, and refills."    Please advise if a prior authorization should be started for this medication.     Maudry Mayhew, BSN RN  Clinical Nurse Coordinator

## 2023-07-18 ENCOUNTER — Other Ambulatory Visit (HOSPITAL_BASED_OUTPATIENT_CLINIC_OR_DEPARTMENT_OTHER): Payer: Self-pay | Admitting: PHYSICIAN ASSISTANT

## 2023-07-18 DIAGNOSIS — F39 Unspecified mood [affective] disorder: Secondary | ICD-10-CM

## 2023-07-18 NOTE — Telephone Encounter (Signed)
Medical Group Practice  [address]  [place], [place] [address]       PATIENT NAME: Rachel [name]  MRN: [id number]  DOB: [birth year]        Date: 07/18/2023  Time: 11:01      Faxed back completed/signed prior auth form to [employer]. Confirmation received. Will scan to patient's chart.      Lynda Rainwater, BSN RN  Clinical Nurse Coordinator

## 2023-07-26 ENCOUNTER — Encounter (HOSPITAL_BASED_OUTPATIENT_CLINIC_OR_DEPARTMENT_OTHER): Payer: Self-pay | Admitting: Student in an Organized Health Care Education/Training Program

## 2023-07-26 ENCOUNTER — Other Ambulatory Visit (HOSPITAL_BASED_OUTPATIENT_CLINIC_OR_DEPARTMENT_OTHER): Payer: Self-pay | Admitting: Student in an Organized Health Care Education/Training Program

## 2023-07-26 ENCOUNTER — Other Ambulatory Visit: Payer: Self-pay

## 2023-07-26 ENCOUNTER — Ambulatory Visit
Payer: Medicare PPO | Attending: Student in an Organized Health Care Education/Training Program | Admitting: Student in an Organized Health Care Education/Training Program

## 2023-07-26 VITALS — BP 126/72 | HR 88 | Temp 97.5°F | Ht 63.74 in | Wt 119.3 lb

## 2023-07-26 DIAGNOSIS — R0789 Other chest pain: Secondary | ICD-10-CM | POA: Insufficient documentation

## 2023-07-26 DIAGNOSIS — Z780 Asymptomatic menopausal state: Secondary | ICD-10-CM | POA: Insufficient documentation

## 2023-07-26 DIAGNOSIS — Z0000 Encounter for general adult medical examination without abnormal findings: Secondary | ICD-10-CM | POA: Insufficient documentation

## 2023-07-26 DIAGNOSIS — K5900 Constipation, unspecified: Secondary | ICD-10-CM | POA: Insufficient documentation

## 2023-07-26 DIAGNOSIS — G2581 Restless legs syndrome: Secondary | ICD-10-CM | POA: Insufficient documentation

## 2023-07-26 DIAGNOSIS — N3010 Interstitial cystitis (chronic) without hematuria: Secondary | ICD-10-CM | POA: Insufficient documentation

## 2023-07-26 DIAGNOSIS — M159 Polyosteoarthritis, unspecified: Secondary | ICD-10-CM | POA: Insufficient documentation

## 2023-07-26 MED ORDER — SOLIFENACIN 10 MG TABLET
10.0000 mg | ORAL_TABLET | Freq: Every day | ORAL | 0 refills | Status: DC
Start: 2023-07-26 — End: 2023-08-18

## 2023-07-26 MED ORDER — POLYETHYLENE GLYCOL 3350 17 GRAM/DOSE ORAL POWDER
17.0000 g | Freq: Every day | ORAL | 5 refills | Status: DC
Start: 2023-07-26 — End: 2023-11-09

## 2023-07-26 NOTE — Nursing Note (Signed)
Medical Group Practice  [address]  [place], [place] [address]       PATIENT NAME: Rachel [name]  MRN: [id number]  DOB: May 15, 1941        Date: 07/26/2023  Time: 09:20      Faxed pain contract to [employer]. Will scan for review.      Lynda Rainwater, BSN RN  Clinical Nurse Coordinator

## 2023-07-26 NOTE — Nursing Note (Signed)
Medical Group Practice  [address]  [place], [place] [address]       PATIENT NAME: Rachel [name]  MRN: [id number]  DOB: 1941-01-08        Date: 07/26/2023  Time: 09:21      Faxed ROI to D[facility]'s office for office visit notes, H&P, and pathology. Confirmation received.      Lynda Rainwater, BSN RN  Clinical Nurse Coordinator

## 2023-07-26 NOTE — Progress Notes (Signed)
INTERNAL MEDICINE, [facility] [address]  [place] [address]  Operated by [facility]  Progress Note    Name: Rachel [name] MRN:  [id number]   Date: 07/26/2023 DOB:  14-Oct-1941 (82 y.o.)           Chief Complaint: General    Subjective:     Rachel [name] is a pleasant 82 year old female with PMH of interstitial cystitis, headache, breast implants s/p removal with subsequent chest pain, osteoarthritis, presenting for re-establishing care.    Used to see [name].    For her leg muscle spasms - she takes tizanidine 1/2 tablet at night so it doesn't knock her out the next day. It helps with her muscle spasms in her legs. She is no longer on Requip.    She has had chest pain since implant removal, saw Dr. Pearson Forster, she will have imaging done then decide on procedure.     She had always been on Prempro after she went through menopause. When she moved here from [facility], they took her off it, and she noticed worsening energy and gained weight. She said insurance would not allow her to get back on Prempro. She was having really bad hot flashes, and she came to see Dr. Nile Riggs, who decided to put her on the hormonal patch which caused an allergic reaction, and on myrbetriq for her urinary tract symptoms, which worsened. So he put her on the norethindrone-estradiol tablet. Then she had UTI in Dec 2023, underwent cystoscopy Jan 2024, meds changed to Elavil and Vagifem.   She went to [facility], who stopped those two medications. She started her on two other pills medroxyprogesterone and Estradiol.     Her weight gain is a concern for her. She used to be 102 lbs, but she thinks that was too skinny, and now she is 119 lbs.     The only thing she has found to help give her relief for her arthritis is CBD cream and the oxycodone.     She is constipated and has pebbles usually for BM. She drinks 2 glasses of water a day. She drinks 1 cup of decaf coffee.    Objective :  BP 126/72 (Site:  Left Arm, Patient Position: Sitting, Cuff Size: Adult)   Pulse 88   Temp 36.4 C (97.5 F) (Temporal)   Ht 1.619 m (5' 3.74")   Wt 54.1 kg (119 lb 4.3 oz)   SpO2 99%   BMI 20.64 kg/m       Gen; NAD  HEENT: no icterus  Abd: symmetric nondistended  Ext: no c/e/e    Data reviewed:    Current Outpatient Medications   Medication Sig    azelastine (ASTELIN) 137 mcg (0.1 %) Nasal Spray, Non-Aerosol Administer 1 Spray into each nostril Twice daily Use in each nostril as directed Indications: seasonal runny nose    dexlansoprazole (DEXILANT) 30 mg Oral Cap, Delayed Rel., Multiphasic Take 1 Capsule (30 mg total) by mouth Once a day Indications: gastroesophageal reflux disease, failed protonix, pepcid, nexium    DULoxetine (CYMBALTA DR) 60 mg Oral Capsule, Delayed Release(E.C.) Take 1 Capsule (60 mg total) by mouth Once a day    fexofenadine (ALLEGRA) 180 mg Oral Tablet Take 1 Tablet (180 mg total) by mouth Once a day    fluticasone propionate (FLONASE) 50 mcg/actuation Nasal Spray, Suspension SHAKE LIQUID AND USE 1 SPRAY IN EACH NOSTRIL TWICE DAILY    ondansetron (ZOFRAN ODT) 4 mg  Oral Tablet, Rapid Dissolve Take 1 Tablet (4 mg total) by mouth Every 8 hours as needed for Nausea/Vomiting    Oxycodone (ROXICODONE) 10 mg Oral Tablet Take 1 Tablet (10 mg total) by mouth Every 6 hours as needed for Pain for up to 30 days Indications: pain, osteoarthritis    sumatriptan succinate (IMITREX) 100 mg Oral Tablet Take 1 Tablet (100 mg total) by mouth Once, as needed for Migraine for up to 12 doses May repeat in 2 hours in needed    tiZANidine (ZANAFLEX) 2 mg Oral Tablet Take 1 Tablet (2 mg total) by mouth Every night     Assessment/Plan  Problem List Items Addressed This Visit          Cardiovascular System    Left-sided chest wall pain  Post implants removal  Has imaging scheduled  Will then follow with Dr. Pearson Forster regarding potential procedure (capsulectomy?)       Musculoskeletal    Osteoarthritis  Oxycodone and CBD cream  work well for her  Signed new pain agreement today  Continue same meds     Other Visit Diagnoses       Encounter for annual health examination    -  Primary    Constipation, unspecified constipation type      Likely due to low water intake and opioid use  Counseled to increase water intake to 6 small bottles daily and start Miralax 1 capful daily    Relevant Medications    polyethylene glycol (MIRALAX) 17 gram/dose Oral Powder    Interstitial cystitis      Used to be on Ditropan and Myrbetriq, neither helped  Has been seen by Dr. Nile Riggs, who has since left, and she has since started seeing Dr. Tivis Ringer  She is agreeable to try Vesicare for her symptoms - counseled re side effects    Relevant Medications    solifenacin (VESICARE) 10 mg Oral Tablet    RLS (restless legs syndrome)      Used to be on Requip, made her too sleepy/tired  Now taking tizanidine half tablet nightly    Post-menopausal      She is currently on medroxprogesterone and estradiol prescribed by Don Broach  Will obtain notes            Total time 40 mins    Reymundo Poll, MD

## 2023-07-31 NOTE — Nursing Note (Signed)
Medical Group Practice  [address]  [place], [place] [address]     PATIENT NAME: Rachel [name]   MRN: [id number]  DOB: August 11, 1941     8.7.2024  11:11    Received the patient's well woman exam from [place] C. Tivis Ringer, MD's office dated for June 18, 2023. Placing in your MGP-UTC box for review.     Maudry Mayhew, BSN RN  Clinical Nurse Coordinator

## 2023-08-01 ENCOUNTER — Encounter (HOSPITAL_BASED_OUTPATIENT_CLINIC_OR_DEPARTMENT_OTHER): Payer: Self-pay | Admitting: Medical

## 2023-08-01 ENCOUNTER — Ambulatory Visit: Payer: Medicare PPO | Attending: Medical | Admitting: Medical

## 2023-08-01 ENCOUNTER — Other Ambulatory Visit: Payer: Self-pay

## 2023-08-01 DIAGNOSIS — Z09 Encounter for follow-up examination after completed treatment for conditions other than malignant neoplasm: Secondary | ICD-10-CM

## 2023-08-01 DIAGNOSIS — Z9886 Personal history of breast implant removal: Secondary | ICD-10-CM | POA: Insufficient documentation

## 2023-08-01 DIAGNOSIS — N644 Mastodynia: Secondary | ICD-10-CM | POA: Insufficient documentation

## 2023-08-01 DIAGNOSIS — M1990 Unspecified osteoarthritis, unspecified site: Secondary | ICD-10-CM | POA: Insufficient documentation

## 2023-08-01 NOTE — Progress Notes (Signed)
BREAST SURGICAL ONCOLOGY NOTE    Chief Complaint:  Bilateral breast pain    Rachel [name] is a 82 y.o. White female who presents as a new patient to clinic today for evaluation of bilateral breast pain x 2 year.      Per review of her outside records and patient report, Rachel [name] has a history of bilateral breast implants placed in 1974 that subsequently ruptured and were replaced in 1994. She initially presented to plastic surgery September of 09/18/2021 where she complained of bilateral breast pain and was taken to the OR shortly after on 01/23/2021 for removal of bilateral saline implants. She states that since then, she has had progressively worsening bilateral breast pain.  She saw plastic surgery 06/25/23 for this complaint and was referred to breast surgery for additional care and evaluation. No recent breast imaging on file.     During today's  visit, Rachel [name] reports her bilateral breast pain started at the time of her breast implant removal in 2022. This pain is dull, achy, and at times sharp. This comes and goes. The pain is better when she is still and worse with laying and certain movements. She reports she takes oxycodone for her arthritis which helps her breast pain. She denies persistent palpable lumps,  nipple discharge, change in appearance of the breasts, skin change, history of trauma to the breast, axillary adenopathy or a previous history of cyst aspirations, biopsies, or personal history of breast cancer.  Family hx was reviewed and unremarkable for cancer. Specifically, she denies ovarian or breast cancers.       She was referred by: Marcelle Smiling, PA-C  [address]  [address]  [place],  [place] [address]      Breast cancer risk factors include: none.    HIGH RISK breast factors include: n/a    Genetics:  n/a    Breast density:  unknown    GYN HISTORY:   Age of menarche was 89.   Patient is G1P2.   Age of menopause was 33's, naturally.    Age of first live birth was 55.   Patient admits  to hormonal therapy.    PMH:   Past Medical History:   Diagnosis Date    Chronic pain     Depression     GERD (gastroesophageal reflux disease)     Low back pain     Memory changes     Migraine     Neck problem     Osteoarthritis     Shortness of breath     Wears glasses          Past Surgical History:   Procedure Laterality Date    ANTERIOR FUSION CERVICAL SPINE  12/30/2020    BREAST IMPLANT REMOVAL Bilateral     2022    CYSTOURETHROSCOPY      FOOT SURGERY Bilateral     HX BREAST AUGMENTATION Bilateral     x3, 1974, 1994, 2014    HX CATARACT REMOVAL Bilateral 01/12/2021    HX CERVICAL SPINE SURGERY      HX ROTATOR CUFF REPAIR Right     SACROILIAC JOINT INJECTION      TOE AMPUTATION           Family Medical History:       Problem Relation (Age of Onset)    Arthritis-osteo Sister    Food Allergy Mother, Sister    No Known Problems Father, Maternal Grandfather, Paternal Grandmother, Paternal Emelia Loron, Daughter  Pneumonia Mother, Maternal Grandmother    Psoriasis Daughter            Current Outpatient Medications   Medication Sig Dispense Refill    azelastine (ASTELIN) 137 mcg (0.1 %) Nasal Spray, Non-Aerosol Administer 1 Spray into each nostril Twice daily Use in each nostril as directed Indications: seasonal runny nose 30 mL 1    dexlansoprazole (DEXILANT) 30 mg Oral Cap, Delayed Rel., Multiphasic Take 1 Capsule (30 mg total) by mouth Once a day Indications: gastroesophageal reflux disease, failed protonix, pepcid, nexium 90 Capsule 3    DULoxetine (CYMBALTA DR) 60 mg Oral Capsule, Delayed Release(E.C.) Take 1 Capsule (60 mg total) by mouth Once a day 90 Capsule 3    estradioL (ESTRACE) 0.5 mg Oral Tablet Take 1 Tablet (0.5 mg total) by mouth Once a day      fexofenadine (ALLEGRA) 180 mg Oral Tablet Take 1 Tablet (180 mg total) by mouth Once a day 90 Tablet 3    fluticasone propionate (FLONASE) 50 mcg/actuation Nasal Spray, Suspension SHAKE LIQUID AND USE 1 SPRAY IN EACH NOSTRIL TWICE DAILY 16 g 0    ondansetron  (ZOFRAN ODT) 4 mg Oral Tablet, Rapid Dissolve Take 1 Tablet (4 mg total) by mouth Every 8 hours as needed for Nausea/Vomiting 30 Tablet 0    Oxycodone (ROXICODONE) 10 mg Oral Tablet Take 1 Tablet (10 mg total) by mouth Every 6 hours as needed for Pain for up to 30 days Indications: pain, osteoarthritis 120 Tablet 0    polyethylene glycol (MIRALAX) 17 gram/dose Oral Powder Take 3 teaspoons (17 g total) by mouth Once a day Indications: constipation 510 g 5    solifenacin (VESICARE) 10 mg Oral Tablet Take 1 Tablet (10 mg total) by mouth Once a day for 30 days 30 Tablet 0    sumatriptan succinate (IMITREX) 100 mg Oral Tablet Take 1 Tablet (100 mg total) by mouth Once, as needed for Migraine for up to 12 doses May repeat in 2 hours in needed 12 Tablet 0    tiZANidine (ZANAFLEX) 2 mg Oral Tablet Take 1 Tablet (2 mg total) by mouth Every night 90 Tablet 3     No current facility-administered medications for this visit.     Allergies   Allergen Reactions    Latex     Darvon [Propoxyphene] Mental Status Effect     Hallucinations    Poison Ivy Extract     Shellfish Derived      Social History     Socioeconomic History    Marital status: Married     Spouse name: Radio producer    Number of children: 2    Years of education: Not on file    Highest education level: Not on file   Occupational History    Occupation: Retired   Tobacco Use    Smoking status: Never    Smokeless tobacco: Never   Vaping Use    Vaping status: Never Used   Substance and Sexual Activity    Alcohol use: Not Currently    Drug use: Not Currently     Frequency: 5.0 times per week     Types: Marijuana     Comment: medical marijuana gel    Sexual activity: Not Currently     Partners: Male   Other Topics Concern    Ability to Walk 1 Flight of Steps without SOB/CP Yes    Routine Exercise No    Ability to Walk 2 Flight of Steps without SOB/CP Not  Asked    Unable to Ambulate Not Asked    Total Care Not Asked    Ability To Do Own ADL's Yes    Uses Walker No    Other Activity  Level Yes     Comment: light house work    Emergency planning/management officer Yes   Social History Narrative    Right handed; married for 47 years; two daughters (fraternal twins).  Two grandson's and one adopted grandson.      Originally from [place], [place].  Previously lived in [place] for 25 years ([place] and [place]) prior to coming back to [place] in late 2022.     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         Twins are to her first husband (was married for 3 years).         Enjoyed needlepoint, knitting, golf, tennis, gardening, cooking (does not do any of them now)        Daughter's both live in [place], [place].          Social Determinants of Health     Financial Resource Strain: Not on file   Transportation Needs: Not on file   Social Connections: Not on file   Intimate Partner Violence: Not on file   Housing Stability: Not on file       REVIEW OF SYSTEMS:  General:  Very pleasant.  Denies fever, chills or recent weight loss.  EENT: + Voice changes and trouble swallowing. Denies blurry vision, tinnitus, nasal congestion.  Cardiovascular:  Denies chest pain, palpitations, history of heart attack or heart surgery.  Respiratory:  Denies SOB, cough, asthma, COPD/emphysema or sleep apnea.  Gastrointestinal:  Denies abdominal pain, nausea/vomiting or constipation/diarrhea.  GU:+ Urinary frequency.  Denies dysuria or hematuria.  Musculoskeletal: + arthritis and back pain. Denies weakness  Neurological:  Denies headache, dizziness or history of stroke.  Hematologic:  Denies history of bleeding disorders or blood clots.  Endocrine:  Denies history of diabetes or thyroid disease.  Psychiatric:  + anxiety Denies depression or insomnia.    Objective:  Flowsheet Row Office Visit from 08/01/2023 in Breast Clinic, [name]   [facility]   Temperature 36.4 C (97.6 F) filed at... 08/01/2023 1002   Heart Rate 94 filed at... 08/01/2023 1002   Respiratory Rate 16 filed at...  08/01/2023 1002   BP (Non-Invasive) 129/56 filed at... 08/01/2023 1002   SpO2 97 % filed at... 08/01/2023 1002   Height 1.6 m (5\' 3" ) filed at... 08/01/2023 1002   Weight 54.5 kg (120 lb 1.6 oz) filed at... 08/01/2023 1002   BMI (Calculated) 21.32 filed at... 08/01/2023 1002   BSA (Calculated) 1.56 filed at... 08/01/2023 1002       Physical Exam:   Constitutional:  Very pleasant, well nourished female in no apparent distress.  HNT: Normocephalic, atraumatic.  Nose is negative for blood.  Neck is supple.  Lymphatics: Negative for cervical, supra/infraclavicular lymphadenopathy.   Cardiovascular:  Heart is of regular rate and rhythm.  Pulmonary:  Normal pulmonary effort and rate is observed.  BREAST: Ptosis noted bilaterally.    RIGHT breast:  Symmetric in shape. Well healed IMF and periareolar scar. +TTP diffusely and to lateral and superior pectoralis muscle. Negative for masses, dimpling . No skin change observed.     Areola:  Consistent pigmentation, negative for ulceration.    Nipple:  No evidence of ulceration, discharge or retraction.  Axilla:  Negative for palpable LN.     LEFT breast:  Symmetric in shape.Well healed IMF and periareolar scar. +TTP diffusely and to lateral and superior pectoralis muscle.   Negative for masses, dimpling or tenderness.    Areola:  Consistent pigmentation, negative for ulceration.    Nipple:  No evidence of ulceration, discharge or retraction.    Axilla:  Negative for palpable LN.  Extremities:  Motor/sensation grossly intact with no gross edema.  Abdomen:  Soft, non-tender, non-distended.   Skin:  Warm and dry without rash.  Psychiatric:  Alert and oriented.    IMAGING RESULTS:  none        Assessment and Plan:    ICD-10-CM    1. Breast pain  N64.4           Rachel [name] is a 82 y.o. White female presents as a new patient for evaluation of bilateral breast pain x 2 since breast implants were removed.  Breast history includes hx of bilateral breast implants placed 1974, 1994  and removed 2022.  Co-morbidities include arthritis and anxiety.     No recent breast imaging on file.     Clinical exam today shows well healed bilateral periareolar and IMF scars from prior breast surgery. She has bilateral breast pain diffusely and overlying her pectoralis muscle. No palpable masses or skin change noted. No recent breast imaging on file. Given her breast pain, will order a bilateral diagnostic mammogram at this time. As her breast pain is diffusely and not targeted, no breast US ordered at this time. She will be notified of results once available. Assuming imaging is benign, this is likely mastalgia (musculoskeletal etiology vs post surgical change). I reviewed symptomatic therapy options for benign breast pain, specifically supportive/sports bra, sleeping in a bra, using cold compresses, NSAID therapy, and tobacco and caffeine avoidance. Oral evening primrose oil or vitamin E and recommended doses were also reviewed.  I explained that these agents have shown varying degrees of response in small trials but typically take 2-3 weeks to have an effect.      Per NCCN guidelines she does not meet criteria for genetic testing.  Her risk factors for breast cancer and family history were reviewed and do not warrant any additional clinical concerns at this time.  We will plan to follow up by telephone is 2 months to reassess her breat pain, sooner if needed. If no improvement, can consider referral to PT.     She was given the opportunity to ask questions and agrees with the treatment recommendations.  She was asked to call with new or worsening symptoms, questions, or concerns.        Delana Meyer, PA-C 08/01/2023, 10:10    On the day of the encounter, a total of 20 minutes was spent on this patient encounter including review of historical information, examination, documentation and post-visit activities. The time documented excludes procedural time.

## 2023-08-01 NOTE — Patient Instructions (Signed)
Breast Pain (also known as mastalgia):    Breast cancer very rarely presents with pain and as a woman gets closer to menopause, often the breast pain gets worse.  Once through menopause, the breast pain will get better in most cases.   More often breast pain happens in one breast and not the other.  It is important to see your doctor to make sure there is nothing specifically causing the breast pain but having a clinical examination and imaging (mammograms and/or ultrasounds) as deemed appropriate by your doctor.  If all of this is negative, the treatment for breast pain is control of the symptoms.    Treatment:  Low fat diet  Exercise  Well fitting bra  Reduction in caffeine intake (sodas, tea, coffees, chocolate)  Vitamin E 400IU (international units) two times a day by mouth    OR    Evening Primrose Oil once a day by mouth      It is important to do monthly breast examinations (not daily or weekly) to ensure no changes to the breast tissue itself.]

## 2023-08-08 ENCOUNTER — Other Ambulatory Visit (HOSPITAL_BASED_OUTPATIENT_CLINIC_OR_DEPARTMENT_OTHER): Payer: Self-pay | Admitting: Medical

## 2023-08-08 ENCOUNTER — Inpatient Hospital Stay (HOSPITAL_BASED_OUTPATIENT_CLINIC_OR_DEPARTMENT_OTHER)
Admission: RE | Admit: 2023-08-08 | Discharge: 2023-08-08 | Disposition: A | Discharge status: Home / Self Care | Payer: Medicare PPO | Source: Ambulatory Visit

## 2023-08-08 ENCOUNTER — Other Ambulatory Visit: Payer: Self-pay

## 2023-08-08 ENCOUNTER — Telehealth (HOSPITAL_BASED_OUTPATIENT_CLINIC_OR_DEPARTMENT_OTHER): Payer: Self-pay | Admitting: Medical

## 2023-08-08 ENCOUNTER — Encounter (HOSPITAL_COMMUNITY): Payer: Self-pay

## 2023-08-08 ENCOUNTER — Inpatient Hospital Stay
Admission: RE | Admit: 2023-08-08 | Discharge: 2023-08-08 | Disposition: A | Discharge status: Home / Self Care | Payer: Medicare PPO | Source: Ambulatory Visit | Attending: Medical | Admitting: Medical

## 2023-08-08 DIAGNOSIS — R928 Other abnormal and inconclusive findings on diagnostic imaging of breast: Secondary | ICD-10-CM

## 2023-08-08 DIAGNOSIS — N644 Mastodynia: Secondary | ICD-10-CM | POA: Insufficient documentation

## 2023-08-08 DIAGNOSIS — Z9889 Other specified postprocedural states: Secondary | ICD-10-CM

## 2023-08-08 NOTE — Telephone Encounter (Signed)
Called Rachel [name] to review imaging results. L and R mammogram revealed a focal asx. L and R breast US was obtained and ultimately BIRADS 3 with no correlate to asx or area of pain. Follow up L and R  dx mammogram recommended in 6 mo. Order placed. Rachel [name] was called and updated with this information. She will try conservative measures for her suspected mastalgia. Will follow up by phone in October.    Delana Meyer, PA-C  08/08/2023 12:20

## 2023-08-09 ENCOUNTER — Other Ambulatory Visit (HOSPITAL_COMMUNITY): Payer: Self-pay

## 2023-08-18 ENCOUNTER — Other Ambulatory Visit (HOSPITAL_BASED_OUTPATIENT_CLINIC_OR_DEPARTMENT_OTHER): Payer: Self-pay | Admitting: Student in an Organized Health Care Education/Training Program

## 2023-08-18 DIAGNOSIS — N3010 Interstitial cystitis (chronic) without hematuria: Secondary | ICD-10-CM

## 2023-08-18 DIAGNOSIS — F39 Unspecified mood [affective] disorder: Secondary | ICD-10-CM

## 2023-08-19 MED ORDER — SOLIFENACIN 10 MG TABLET
10.0000 mg | ORAL_TABLET | Freq: Every day | ORAL | 3 refills | Status: DC
Start: 2023-08-19 — End: 2023-12-03

## 2023-08-19 NOTE — Telephone Encounter (Signed)
Medical Group Practice  [address]  [place], [place] [address]     PATIENT NAME: Rachel [name]  MRN: [id number]  DOB: 1941-11-04     8.26.2024  08:49    [name] is scheduled to follow-up on July 29, 2024.     Maudry Mayhew, BSN RN  Clinical Nurse Coordinator

## 2023-08-23 ENCOUNTER — Other Ambulatory Visit (HOSPITAL_BASED_OUTPATIENT_CLINIC_OR_DEPARTMENT_OTHER): Payer: Self-pay | Admitting: Student in an Organized Health Care Education/Training Program

## 2023-08-23 DIAGNOSIS — F39 Unspecified mood [affective] disorder: Secondary | ICD-10-CM

## 2023-08-23 DIAGNOSIS — N3010 Interstitial cystitis (chronic) without hematuria: Secondary | ICD-10-CM

## 2023-08-23 DIAGNOSIS — M1990 Unspecified osteoarthritis, unspecified site: Secondary | ICD-10-CM

## 2023-08-23 MED ORDER — OXYCODONE 10 MG TABLET
10.0000 mg | ORAL_TABLET | Freq: Four times a day (QID) | ORAL | 0 refills | Status: DC | PRN
Start: 2023-08-23 — End: 2023-10-03

## 2023-08-23 NOTE — Telephone Encounter (Signed)
Copied from CRM #[id number]. Topic: Clinical - Refill Request  >> Aug 23, 2023  9:33 AM Hendricks Limes wrote:  Rachel [name] called to request [name] prescription refill. Pt is out of meds    Oxycodone (ROXICODONE) 10 mg Oral Tablet 120 Tablet 0 07/17/2023 08/16/2023   Sig - Route: Take 1 Tablet (10 mg total) by mouth Every 6 hours as needed for Pain for up to 30 days Indications: pain, osteoarthritis - Oral         Preferred Pharmacy     [facility] [phone] Rachel [name], [place] - [address] AT   [facility] [address]    [address] [facility] [address]    Phone: [phone] Fax: [phone]    Hours: Not open 24 hours

## 2023-09-11 ENCOUNTER — Ambulatory Visit: Payer: Medicare PPO | Attending: Family | Admitting: NURSE PRACTITIONER

## 2023-09-11 ENCOUNTER — Encounter (HOSPITAL_BASED_OUTPATIENT_CLINIC_OR_DEPARTMENT_OTHER): Payer: Self-pay | Admitting: NURSE PRACTITIONER

## 2023-09-11 ENCOUNTER — Other Ambulatory Visit: Payer: Self-pay

## 2023-09-11 VITALS — Temp 96.1°F | Ht 63.5 in | Wt 119.3 lb

## 2023-09-11 DIAGNOSIS — D692 Other nonthrombocytopenic purpura: Secondary | ICD-10-CM | POA: Insufficient documentation

## 2023-09-11 DIAGNOSIS — D1801 Hemangioma of skin and subcutaneous tissue: Secondary | ICD-10-CM

## 2023-09-11 DIAGNOSIS — L821 Other seborrheic keratosis: Secondary | ICD-10-CM | POA: Insufficient documentation

## 2023-09-11 DIAGNOSIS — D1800 Hemangioma unspecified site: Secondary | ICD-10-CM | POA: Insufficient documentation

## 2023-09-11 DIAGNOSIS — L989 Disorder of the skin and subcutaneous tissue, unspecified: Secondary | ICD-10-CM | POA: Insufficient documentation

## 2023-09-11 DIAGNOSIS — L570 Actinic keratosis: Secondary | ICD-10-CM | POA: Insufficient documentation

## 2023-09-11 DIAGNOSIS — Z1283 Encounter for screening for malignant neoplasm of skin: Secondary | ICD-10-CM | POA: Insufficient documentation

## 2023-09-11 DIAGNOSIS — D229 Melanocytic nevi, unspecified: Secondary | ICD-10-CM | POA: Insufficient documentation

## 2023-09-11 NOTE — Progress Notes (Signed)
Dermatology Clinic, [facility]  [address]  [place] [address]  [phone]    Date:   09/11/2023  Name: Rachel [name]  Age: 82 y.o.  Last visit: 09/11/2022    Chief complaint: skin lesions     HPI  Rachel [name] is a 82 y.o. female with no personal/family history of skin cancer, presenting for concern(s) of bruising easily and rough area on her face and chest. Reports persistent bruising on lower extremities. Reports scaly lesion in her right ear. Notes a history of sun exposure. No other new, changing, bleeding, or symptomatic lesions or moles. States she is not frequently in the sun.     Review of Systems   Constitutional:  Negative for chills and fever.   Skin:  Negative for itching and rash.            Current Medications  Outpatient Encounter Medications as of 09/11/2023   Medication Sig Dispense Refill    azelastine (ASTELIN) 137 mcg (0.1 %) Nasal Spray, Non-Aerosol Administer 1 Spray into each nostril Twice daily Use in each nostril as directed Indications: seasonal runny nose 30 mL 1    dexlansoprazole (DEXILANT) 30 mg Oral Cap, Delayed Rel., Multiphasic Take 1 Capsule (30 mg total) by mouth Once a day Indications: gastroesophageal reflux disease, failed protonix, pepcid, nexium 90 Capsule 3    DULoxetine (CYMBALTA DR) 60 mg Oral Capsule, Delayed Release(E.C.) Take 1 Capsule (60 mg total) by mouth Once a day 90 Capsule 3    estradioL (ESTRACE) 0.5 mg Oral Tablet Take 1 Tablet (0.5 mg total) by mouth Once a day      fexofenadine (ALLEGRA) 180 mg Oral Tablet Take 1 Tablet (180 mg total) by mouth Once a day 90 Tablet 3    fluticasone propionate (FLONASE) 50 mcg/actuation Nasal Spray, Suspension SHAKE LIQUID AND USE 1 SPRAY IN EACH NOSTRIL TWICE DAILY 16 g 0    medroxyPROGESTERone (PROVERA) 2.5 mg Oral Tablet Take 1 Tablet (2.5 mg total) by mouth Once a day      ondansetron (ZOFRAN ODT) 4 mg Oral Tablet, Rapid Dissolve Take 1 Tablet (4 mg total) by mouth Every 8  hours as needed for Nausea/Vomiting 30 Tablet 0    Oxycodone (ROXICODONE) 10 mg Oral Tablet Take 1 Tablet (10 mg total) by mouth Every 6 hours as needed for Pain for up to 30 days Indications: pain, osteoarthritis 120 Tablet 0    polyethylene glycol (MIRALAX) 17 gram/dose Oral Powder Take 3 teaspoons (17 g total) by mouth Once a day Indications: constipation 510 g 5    solifenacin (VESICARE) 10 mg Oral Tablet Take 1 Tablet (10 mg total) by mouth Once a day for 360 days 90 Tablet 3    sumatriptan succinate (IMITREX) 100 mg Oral Tablet Take 1 Tablet (100 mg total) by mouth Once, as needed for Migraine for up to 12 doses May repeat in 2 hours in needed 12 Tablet 0    tiZANidine (ZANAFLEX) 2 mg Oral Tablet Take 1 Tablet (2 mg total) by mouth Every night 90 Tablet 3    [DISCONTINUED] Oxycodone (ROXICODONE) 10 mg Oral Tablet Take 1 Tablet (10 mg total) by mouth Every 6 hours as needed for Pain for up to 30 days Indications: pain, osteoarthritis 120 Tablet 0    [DISCONTINUED] solifenacin (VESICARE) 10 mg Oral Tablet Take 1 Tablet (10 mg total) by mouth Once a day for 30 days 30 Tablet 0     No facility-administered encounter  medications on file as of 09/11/2023.        Allergies   Allergen Reactions    Latex     Darvon [Propoxyphene] Mental Status Effect     Hallucinations    Poison Ivy Extract     Shellfish Derived         Past Medical History:   Diagnosis Date    Chronic pain     Depression     GERD (gastroesophageal reflux disease)     Low back pain     Memory changes     Migraine     Neck problem     Osteoarthritis     Shortness of breath     Wears glasses             Physical Exam  Vitals:    Temp:  [35.6 C (96.1 F)] 35.6 C (96.1 F) (09/18 1117)   Vitals:    09/11/23 1117   Temp: (!) 35.6 C (96.1 F)   TempSrc: Thermal Scan   Weight: 54.1 kg (119 lb 4.3 oz)   Height: 1.613 m (5' 3.5")        Physical Exam  Constitutional:       General: She is not in acute distress.     Appearance: Normal appearance.   HENT:       Head:     Eyes:      General: Lids are normal.   Skin:     General: Skin is warm and dry.      Coloration: Skin is not pale.          Neurological:      Mental Status: She is alert and oriented to person, place, and time. Mental status is at baseline.      Coordination: Coordination normal.   Psychiatric:         Mood and Affect: Mood normal.         General skin exam was performed including head, neck, anterior/posterior trunk, bilateral upper and lower extremities and revealed no areas of concern other than those documented.     Assessment and Plan  #Actinic Keratoses   -The pre-cancerous nature of these lesions and the relation to chronic sun exposure was discussed with the patient. Discussed cryotherapy, efudex, and blu light and patient declined. Photoprotection was recommended to help limit the development of additional lesions. Instructed patient to return if lesions become tender or thicker with scale. Pt verbalized understanding.    #Actinic purpura - chronic  (see #1 on body diagram)  - Recommend gentle cleanser daily and vaseline at night   - Continue Arnicare    - Recommend SPF 30 sunscreen and protective clothing when out.     #Seborrheic keratoses (see #2& #3 on body diagram) - new  -I discussed with the patient that a seborrheic keratosis is a common, benign epidermal lesion and that most patients develop at least one seborrheic keratosis in their lifetime.  The condition is usually asymptomatic, and unless disturbed, most seborrheic keratoses persist and grow slowly.  No intervention is indicated at this time.  I will continue to follow the patient.      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 Patient was advised to use broad spectrum sunscreen daily with at least SPF 30, especially one that contains zinc and titanium. It was recommended to reapply sunscreen every two hours. The important to wear protective clothing and avoid the mid-day sun (approx 10AM till 2PM) when possible was stressed to the patient. Advised the patient to avoid going to and using tanning beds.        RTC 12 months or sooner if needed.    This is a shared visit with Dr. Tora Duck    I spent 20 minutes with patient     Earnstine Regal, APRN,FNP-BC       Time:  5 min       I personally saw and evaluated the patient. See mid-level's note for additional details. My findings/participation are AK - cryo; SK     Hinda Lenis, MD

## 2023-10-01 NOTE — Progress Notes (Incomplete)
BREAST CLINIC, [name]  [address]  [place] [address]  Operated by [facility]  Telephone Visit    Name:  Rachel [name] MRN: [id number]   Date:  10/02/2023 Age:   82 y.o.     The patient initiated a request for telephone service.  Verbal consent for this service was obtained from the patient.     Last office visit in this department: 08/01/2023      Reason for call: Follow up breast pain       Hx:   Seen 08/01/23 for bilateral breast pain x 2 years. , Rachel [name] has a history of bilateral breast implants placed in 1974 that subsequently ruptured and were replaced in 1994. She initially presented to plastic surgery September of 09/18/2021 where she complained of bilateral breast pain and was taken to the OR shortly after on 01/23/2021 for removal of bilateral saline implants. She states that since then, she has had progressively worsening bilateral breast pain.  She saw plastic surgery 06/25/23 for this complaint and was referred to breast surgery for additional care and evaluation.     Family hx was reviewed and unremarkable for cancer. Specifically, she denies ovarian or breast cancers.     Imaging:  B/l dx mammogram, 08/08/23: R- asx 10-11:00, 8 cm FN as we well as L asx, upper region 4 cm FN (BIRADS 0).  2. RIGHT breast US, 08/08/23: No mass or discrete lesion is seen to correspond to the asymmetry in the upper-outer right breast seen on mammography.  This is probably benign, and a follow-up right unilateral mammogram in 6 months is recommended.Also, no ultrasound abnormality is seen in the location of the patient's reported pain in the outer right breast. BIRADS 3.   3. LEFT breast US, 08/08/23: No mass or discrete lesion is seen to correspond to the focal asymmetry in the upper-outer left breast seen on mammography.  This finding is probably benign, and a follow-up mammogram in 6 months is recommended.Also, no ultrasound abnormality is seen in the location of the patient's  reported pain in the outer left breast.  There is a postsurgical scar in this location.  The patient had breast implant removal approximately 2 years ago, BIRADS 3.     Call notes:  ***      Plan:  - Offered plastic surgery referral regarding implants  - Cont conservative measures for breast pain   -  Follow up b/l dx mammogram 01/2024 as scheduled for BIRADS 3 findings bilaterally.   - Cont self exams  - call with questions, concerns, or changes.     No diagnosis found.    Total provider time spent with the patient on the phone: *** minutes.    Delana Meyer, PA-C

## 2023-10-02 ENCOUNTER — Telehealth (HOSPITAL_BASED_OUTPATIENT_CLINIC_OR_DEPARTMENT_OTHER): Payer: Self-pay | Admitting: Medical

## 2023-10-02 ENCOUNTER — Ambulatory Visit: Payer: Medicare PPO | Attending: Medical | Admitting: Medical

## 2023-10-02 ENCOUNTER — Other Ambulatory Visit (HOSPITAL_BASED_OUTPATIENT_CLINIC_OR_DEPARTMENT_OTHER): Payer: Self-pay | Admitting: Student in an Organized Health Care Education/Training Program

## 2023-10-02 DIAGNOSIS — M1990 Unspecified osteoarthritis, unspecified site: Secondary | ICD-10-CM

## 2023-10-02 NOTE — Telephone Encounter (Signed)
Called patient for telephone visit. Reached her answering machine. Machine left requesting a return call (if she gets this within the next 15 mins of her appt time). My contact information was left.    Delana Meyer, PA-C  10/02/2023 10:18

## 2023-10-03 MED ORDER — OXYCODONE 10 MG TABLET
10.0000 mg | ORAL_TABLET | Freq: Four times a day (QID) | ORAL | 0 refills | Status: DC | PRN
Start: 2023-10-03 — End: 2023-11-14

## 2023-10-03 MED ORDER — SUMATRIPTAN 100 MG TABLET
100.0000 mg | ORAL_TABLET | Freq: Once | ORAL | 0 refills | Status: DC | PRN
Start: 2023-10-03 — End: 2023-12-21

## 2023-10-03 NOTE — Telephone Encounter (Addendum)
Message from [place] S sent at 10/03/2023  7:19 AM EDT   Regarding: FW: Refill Request  Message from [place] F sent at 10/02/2023  4:45 PM EDT   Copied From CRM #[id number].  Reymundo Poll, MD    Rachel [name] called to request [name] prescription refill.     Oxycodone (ROXICODONE) 10 mg Oral Tablet 120 Tablet 0 8/30/20249/29/2024 Sig - Route: Take 1 Tablet (10 mg total) by mouth Every 6 hours as needed for Pain for up to 30 days Indications: pain, osteoarthritis - Oral     Pt is requesting antibiotic for [name] sinus infection. Pt states that [name] would send it in this time [name] for her. Pt was informed that she may need an appt. She has yellow discharge from nose and headaches. Please call pt to advise.     Preferred Pharmacy    [name] [phone] - [name], [place] - [address] AT   [facility] [address]   [address] [facility] [address]   Phone: [phone] Fax: [phone]   Hours: Not open 24 hours

## 2023-10-03 NOTE — Telephone Encounter (Signed)
Controlled substances database reviewed.  Refills sent electronically to patient's pharmacy.  Sharlyn Bologna, MD  10/03/2023, 08:54

## 2023-10-08 NOTE — Progress Notes (Signed)
BREAST CLINIC, [name]  [address]  [place] [address]  Operated by [facility]  Telephone Visit    Name:  Rachel [name] MRN: [id number]   Date:  10/09/2023 Age:   82 y.o.     The patient initiated a request for telephone service.  Verbal consent for this service was obtained from the patient.     Last office visit in this department: 08/01/2023      Reason for call: Follow up breast pain       Hx:   Seen 08/01/23 for bilateral breast pain x 2 years. , Harly has a history of bilateral breast implants placed in 1974 that subsequently ruptured and were replaced in 1994. She initially presented to plastic surgery September of 09/18/2021 where she complained of bilateral breast pain and was taken to the OR shortly after on 01/23/2021 for removal of bilateral saline implants. She states that since then, she has had progressively worsening bilateral breast pain.  She saw plastic surgery 06/25/23 for this complaint and was referred to breast surgery for additional care and evaluation.     Family hx was reviewed and unremarkable for cancer. Specifically, she denies ovarian or breast cancers.     Imaging:  B/l dx mammogram, 08/08/23: R- asx 10-11:00, 8 cm FN as we well as L asx, upper region 4 cm FN (BIRADS 0).  2. RIGHT breast US, 08/08/23: No mass or discrete lesion is seen to correspond to the asymmetry in the upper-outer right breast seen on mammography.  This is probably benign, and a follow-up right unilateral mammogram in 6 months is recommended.Also, no ultrasound abnormality is seen in the location of the patient's reported pain in the outer right breast. BIRADS 3.   3. LEFT breast US, 08/08/23: No mass or discrete lesion is seen to correspond to the focal asymmetry in the upper-outer left breast seen on mammography.  This finding is probably benign, and a follow-up mammogram in 6 months is recommended.Also, no ultrasound abnormality is seen in the location of the patient's  reported pain in the outer left breast.  There is a postsurgical scar in this location.  The patient had breast implant removal approximately 2 years ago, BIRADS 3.     Call notes:  During today's visit, [name] reports her breast pain is "the same." Report it is improved with an extra dose of tylenol in addition to her baseline oxycodone she takes for arthritis. She has also tried new bras which are not helpful. Has not yet tried OTC ibuprofen. No new breast lumps/bumps or complaints at this time.     ROS: negative     Assessment: Bilateral breast pain not improved with conservative measures. Recent breast imaging with no etiology for breast pain.     Plan:  - Will refer to PT at this time for additional care and evaluation of bilateral breast pain. R/o musculoskeletal etiology.  - Follow up in 2 mo by phone to assess her progress.   - In the interim, continue conservative measures. We did review trying ibuprofen as needed for breast pain in addition to tylenol as directed.   -  Follow up b/l dx mammogram 01/2024  (in 6 mo from prior imaging, 8/24) as scheduled for BIRADS 3 findings bilaterally.   - Cont self exams  - call with questions, concerns, or changes.       ICD-10-CM    1. Breast pain  N64.4  Total provider time spent with the patient on the phone: 4 minutes.    Delana Meyer, PA-C

## 2023-10-09 ENCOUNTER — Ambulatory Visit: Payer: Medicare PPO | Attending: Medical | Admitting: Medical

## 2023-10-09 DIAGNOSIS — N644 Mastodynia: Secondary | ICD-10-CM

## 2023-10-10 ENCOUNTER — Encounter (HOSPITAL_BASED_OUTPATIENT_CLINIC_OR_DEPARTMENT_OTHER): Payer: Self-pay

## 2023-10-16 ENCOUNTER — Other Ambulatory Visit (HOSPITAL_BASED_OUTPATIENT_CLINIC_OR_DEPARTMENT_OTHER): Payer: Self-pay | Admitting: OBSTETRICS/GYNECOLOGY

## 2023-10-16 DIAGNOSIS — N3010 Interstitial cystitis (chronic) without hematuria: Secondary | ICD-10-CM

## 2023-10-16 NOTE — Telephone Encounter (Signed)
Discontinued 07/26/23

## 2023-10-19 ENCOUNTER — Other Ambulatory Visit (HOSPITAL_BASED_OUTPATIENT_CLINIC_OR_DEPARTMENT_OTHER): Payer: Self-pay | Admitting: Student in an Organized Health Care Education/Training Program

## 2023-10-31 ENCOUNTER — Encounter (INDEPENDENT_AMBULATORY_CARE_PROVIDER_SITE_OTHER): Payer: Self-pay

## 2023-10-31 ENCOUNTER — Other Ambulatory Visit: Payer: Self-pay

## 2023-10-31 ENCOUNTER — Ambulatory Visit (INDEPENDENT_AMBULATORY_CARE_PROVIDER_SITE_OTHER): Payer: Self-pay

## 2023-10-31 DIAGNOSIS — L905 Scar conditions and fibrosis of skin: Secondary | ICD-10-CM

## 2023-10-31 DIAGNOSIS — N644 Mastodynia: Secondary | ICD-10-CM

## 2023-10-31 DIAGNOSIS — N6459 Other signs and symptoms in breast: Secondary | ICD-10-CM

## 2023-10-31 MED ORDER — TIZANIDINE 2 MG TABLET
2.0000 mg | ORAL_TABLET | Freq: Every evening | ORAL | 3 refills | Status: DC
Start: 2023-10-31 — End: 2023-12-24

## 2023-10-31 NOTE — Progress Notes (Signed)
ONCOLOGY PHYSICAL THERAPY, [facility]  [address]  [place] [address]      Oncology Physical Therapy Evaluation    Name: Rachel [name]  Visit date: 10/31/2023    MRN: [id number]  Eval date: 10/31/23   POC Signed: 60/6/30 Re-Cert: 12/30/99    Insurance: [employer] Medicare Visit number: 1     Total treatment time:   Evaluation  20 mins   Re-evaluation 97164    Manual therapy 97140 20 mins   Therapeutic exercise 97110 10 mins    Therapeutic activities 97530    Neuromuscular re-education 97112    Gait training 97116    PBM     10:42 am - 11:32 am = 50 mins     Chief Complaint: deep bilateral breast pain  Reason for Referral: Bilateral breast pain not improved with supportive measures   Referring Physician: Delana Meyer, PA-C     History of Present Illness or Injury & Patient Comments: Myar reports chronic bilateral lateral breast pain with no personal history of malignancy. She reports pain randomly every day for 1-2 mins that is 10/10 and describes as achy. She has a history of bilateral breast implants placed in 1974 that subseqently ruptured and were replaced in 1994. Removal of implants 01/23/21 due to pain however, reports since that time progressively increase in pain. She reports urinary continence and low back pain. She reports previously being very active.  Response to Treatment: good understanding of home exercise program  HEP Compliance: to be completed 1x a day    history per chart review:  Seen 08/01/23 for bilateral breast pain x 2 years. , Rachel [name] has a history of bilateral breast implants placed in 1974 that subsequently ruptured and were replaced in 1994. She initially presented to plastic surgery September of 09/18/2021 where she complained of bilateral breast pain and was taken to the OR shortly after on 01/23/2021 for removal of bilateral saline implants. She states that since then, she has had progressively worsening bilateral breast pain.  She saw plastic surgery 06/25/23  for this complaint and was referred to breast surgery for additional care and evaluation.     Imaging:  B/l dx mammogram, 08/08/23: R- asx 10-11:00, 8 cm FN as we well as L asx, upper region 4 cm FN (BIRADS 0).  2. RIGHT breast US, 08/08/23: No mass or discrete lesion is seen to correspond to the asymmetry in the upper-outer right breast seen on mammography.  This is probably benign, and a follow-up right unilateral mammogram in 6 months is recommended.Also, no ultrasound abnormality is seen in the location of the patient's reported pain in the outer right breast. BIRADS 3.   3. LEFT breast US, 08/08/23: No mass or discrete lesion is seen to correspond to the focal asymmetry in the upper-outer left breast seen on mammography.  This finding is probably benign, and a follow-up mammogram in 6 months is recommended.Also, no ultrasound abnormality is seen in the location of the patient's reported pain in the outer left breast.  There is a postsurgical scar in this location.  The patient had breast implant removal approximately 2 years ago, BIRADS 3    Current Outpatient Medications   Medication Sig    azelastine (ASTELIN) 137 mcg (0.1 %) Nasal Spray, Non-Aerosol Administer 1 Spray into each nostril Twice daily Use in each nostril as directed Indications: seasonal runny nose    dexlansoprazole (DEXILANT) 30 mg Oral Cap, Delayed Rel., Multiphasic Take 1 Capsule (30 mg total)  by mouth Once a day Indications: gastroesophageal reflux disease, failed protonix, pepcid, nexium    DULoxetine (CYMBALTA DR) 60 mg Oral Capsule, Delayed Release(E.C.) Take 1 Capsule (60 mg total) by mouth Once a day    estradioL (ESTRACE) 0.5 mg Oral Tablet Take 1 Tablet (0.5 mg total) by mouth Once a day    fexofenadine (ALLEGRA) 180 mg Oral Tablet Take 1 Tablet (180 mg total) by mouth Once a day    fluticasone propionate (FLONASE) 50 mcg/actuation Nasal Spray, Suspension SHAKE LIQUID AND USE 1 SPRAY IN EACH NOSTRIL TWICE DAILY    medroxyPROGESTERone  (PROVERA) 2.5 mg Oral Tablet Take 1 Tablet (2.5 mg total) by mouth Once a day    ondansetron (ZOFRAN ODT) 4 mg Oral Tablet, Rapid Dissolve Take 1 Tablet (4 mg total) by mouth Every 8 hours as needed for Nausea/Vomiting    Oxycodone (ROXICODONE) 10 mg Oral Tablet Take 1 Tablet (10 mg total) by mouth Every 6 hours as needed for Pain for up to 30 days Indications: pain, osteoarthritis    polyethylene glycol (MIRALAX) 17 gram/dose Oral Powder Take 3 teaspoons (17 g total) by mouth Once a day Indications: constipation    solifenacin (VESICARE) 10 mg Oral Tablet Take 1 Tablet (10 mg total) by mouth Once a day for 360 days    sumatriptan succinate (IMITREX) 100 mg Oral Tablet Take 1 Tablet (100 mg total) by mouth Once, as needed for Migraine for up to 12 doses May repeat in 2 hours in needed    tiZANidine (ZANAFLEX) 2 mg Oral Tablet Take 1 Tablet (2 mg total) by mouth Every night     Past Medical History:   Diagnosis Date    Chronic pain     Depression     GERD (gastroesophageal reflux disease)     Low back pain     Memory changes     Migraine     Neck problem     Osteoarthritis     Shortness of breath     Wears glasses          Past Surgical History:   Procedure Laterality Date    ANTERIOR FUSION CERVICAL SPINE  12/30/2020    BREAST IMPLANT REMOVAL Bilateral     2022    CYSTOURETHROSCOPY      FOOT SURGERY Bilateral     HX BREAST AUGMENTATION Bilateral     x3, 1974, 1994, 2014    HX CATARACT REMOVAL Bilateral 01/12/2021    HX CERVICAL SPINE SURGERY      HX ROTATOR CUFF REPAIR Right     SACROILIAC JOINT INJECTION      TOE AMPUTATION       Social History:  Home set up: house - denies concerns moving throughout home  Support: husband, dog  Employment: Runner, 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 Patient expressed sadness that her husband refuses to touch her breasts after removal of implants due to cosmetics  Preferred learning style: verbal  Learning barriers: none  Safety awareness: none  Education needs:  general exercise guidelines, pain management, upper extremity range of motion    System Review  Cardiovascular/Pulmonary  HR (bpm): 88  O2 saturation: 99 %  - patient denies following with cardiologist or having heart disease  - denies edema in ankles  - reports shortness of breath with extended hiking at [employer]:   Scars Bilateral inferior breast post saline implants    Edema None appreciated   Ecchymosis none   Erythema none   Integrity Clean, dry, intact  Medial scars with good soft tissue mobility  Lateral scar with mild decrease in soft tissue mobility      Neuromuscular:   Sensation:   -  Intact to light touch with denial of numbness or tingling along bilateral breasts  Balance and Gait:  Normal static and dynamic sitting and standing balance. Patient ambulated into clinic with normal reciprocal gait pattern and no evidence of unsteadiness.    Pain:     Present: 0 Best: 0 Worst: 10/10     Location Bilateral breast pain lateral   Characteristics achy   Activity that cause exacerbation/relief    Functional limitations Quality of life        Musculoskeletal:     ROM  Bilateral Shoulder:  See below   Bilateral Elbow: WFL  Pain Free   Bilateral Wrist: WFL  Pain Free   Bilateral Hand: WFL  Pain Free     Joint GH  History of right rotator cuff repair       Right Left    Flexion 165 170    Extension      Abduction 165 170    Adduction      Internal Rotation L5 L5    External Rotation C5 C5       Manual Muscle Testing    Bilateral Shoulder:  4+/5     Bilateral Elbow: 4+/5    Bilateral Wrist: 4+/5      Functional mobility:   Transfers: PLOF: independent      CLOF: independent    Gait:  PLOF: independent      CLOF: independent      Assessment: normal reciprocal gait pattern       Functional Test Scores:   Grip Strength: complete next apt   QuickDASH: eval 10/31/23 38.6      Observation/Posture:  Mild rounding of bilateral shoulders, mild forward head     Skilled Services Provided:     Therapeutic Exercise:   Access Code: [email or web address]  URL: [email or web address]  Date: 10/31/2023    Program Notes  scar massage: flat fingers, small circles work up to 3-5 mins every day    Exercises  - Seated Bicep Curls Supinated with Dumbbells  - 1 x daily - 7 x weekly - 2 sets - 10 reps  - shoulder blade squeeze with palms up  - 1 x daily - 7 x weekly - 2 sets - 10 reps  - Standing Shoulder Row with Anchored Resistance  - 1 x daily - 7 x weekly - 2 sets - 10 reps  - Corner Stretch  - 1 x daily - 7 x weekly - 2 sets - 10 reps    Manual Therapy:  - scar tissue mobilization to bilateral breast implant incisions   - soft tissue mobilization including trigger point release to bilateral pectoralis and subscapularis   Scar tissue mobilization: use flat fingers in small circular motions, gentle pressure and work up to firm pressure, no pain. Start  with one minute a day and work up to 3-5 mins everyday. Discussed possibility of patient completing or if someone supportive who she trusts is willing to help complete scar massage     Therapeutic Activities: not performed  Gait Training: not performed  Neuromuscular re-education: plan to add NEXT apt   - Verbal and tactile cues for diaphragmatic breathing.  Discussed benefits of diaphragmatic breathing including activation of lymphatic system, reduction in sympathetic nervous system, pain reduction.    PT EVALUATION    Problem List/Impairments:  Impaired bilateral shoulder range of motion  Bilateral shoulder weakness  Reduced soft tissue  mobility  Postural dysfunction  Bilateral breast pain  Functional Limitations:   ADLs   IADLs (home chores, cleaning)  Recreation (exercise, visiting with friends and family)    Disability:  mild to moderate per quickDASH 38.6    Clinical Impression: Merisha is a pleasant 82 year old female who presents to oncology physical therapy for bilateral breast pain without history of cancer.  Their past medical history significant for multiple breast implant exchange and removal surgeries.  They present with the above-listed impairments and limitations as a sequela of plastic surgery.  Her breast pain is consistent with scar tissue and pain following implant surgeries. The pain does not impact her sleep, breast pain did not change with shoulder range of motion, although tenderness and decreased mobility noted along lateral scars. She had a beautiful response to manual therapy with a palpable increase in soft tissue mobility along bilateral implant scars, patient reported tenderness occasionally with manual therapy. She demonstrated proper form and good understanding of all exercises after verbal and tactile cues.  Their past Medical history of right rotator cuff repair, anterior cervical fusion 2022, chronic low back pain, urinary incontinence, depression, GERD, restless leg, memory changes, osteoarthritis may require additional time to progress through her POC.  She is very motivated to participate in skilled PT to regain or improve her independence with ADLs/IADLs, recreation.  She will benefit from skilled PT to restore her PLOF, decrease pain, improve her functional mobility, and improve her QOL.  She was given adequate time to ask all questions and express all concerns.  She reported being satisfied with the answers provided and is agreeable to plan of care as listed below    - Recommended patient discuss breast pain and shortness of breath with hiking with PCP   - discussed assessment and plan with referring  provider. Plan to follow up with referring provider if pain has not decreased in 3 weeks   - Discussed possible pelvic floor physical therapy referral (patient will need to discuss with her primary care physician for referral first) due to urinary incontinence and chronic low back pain. Gave patient list of 4 local pelvic floor physical therapist (she is aware she has freedom of choice of providers and PT did not recommend one over the other)    Prognosis:  great due to strong motivation, good response to today's treatment, lack of positive support following breast implants removal from husband    PLAN OF CARE   Treatment intensity: 1x per week with plan to increase to 1x every 2 weeks  Duration: 3 months   Interventions  Therapeutic Exercise:  Scapular and shoulder strengthening, stretching of the pectoralis muscles, thoracic and lumbar spine stabilization    Neuromuscular Re-education:  Diaphragmatic breathing, scapular muscle activation, pain science   Therapeutic Activities:  ADLs and IADL modification, functional exercises to improve ability to return to everyday activities  Manual Therapy:  Soft tissue mobilization including trigger point release, myofascial release, and scar mobilization  Endurance Training:  Walking program, general exercise guidelines    Goals:  Decrease QuickDASH score to 25 to improve ability to return to recreation  Increase bilateral upper extremity strength to globally 5/5 to improve ability to return to recreation  Reduce breast pain maximum from 10/10 to no more than 3/10  Patient will be independent with home exercise program    Estimated total visit 12. Visit mays changed based on patients ongoing cancer treatment

## 2023-10-31 NOTE — Telephone Encounter (Addendum)
Regarding: Clinical Question  Message from Lum Babe sent at 10/31/2023  3:32 PM EST   Copied From CRM [id number].  [place], [place] A called with a clinical question.   Reymundo Poll  MD    Rx request: Tizanidine 2mg  daily., and also she has a sinus infection x 3 weeks and over the counter medicine is not helping and she's asking what you recommend?  She was agreeable to an apt with MGP but at this time there are no openings at [facility] or POC for this week.   Please call patient to advise     Preferred Pharmacy    [facility] [phone] - [place], [place] - [address] AT   [facility] [address]  [address] [facility] [address]   Phone: [phone] Fax: [phone]   Hours: Not open 24 hours

## 2023-11-07 ENCOUNTER — Other Ambulatory Visit: Payer: Self-pay

## 2023-11-07 ENCOUNTER — Ambulatory Visit (INDEPENDENT_AMBULATORY_CARE_PROVIDER_SITE_OTHER): Payer: Medicare PPO

## 2023-11-07 DIAGNOSIS — N6459 Other signs and symptoms in breast: Secondary | ICD-10-CM

## 2023-11-07 DIAGNOSIS — L905 Scar conditions and fibrosis of skin: Secondary | ICD-10-CM

## 2023-11-07 DIAGNOSIS — N644 Mastodynia: Secondary | ICD-10-CM

## 2023-11-07 NOTE — Progress Notes (Signed)
ONCOLOGY PHYSICAL THERAPY, [facility]  [address]  [place] [address]    Oncology Physical Therapy Progress Note    Name: Rachel [name]  Visit date: 11/07/2023    MRN: [id number]  Eval date: 10/31/23   POC Signed: 36/6/44 Re-Cert: 0/3/47    Insurance: [employer] Medicare Visit number: 2     Total treatment time:   Evaluation     Re-evaluation 97164    Manual therapy 97140 32 mins   Therapeutic exercise 97110 8 mins    Therapeutic activities 97530    Neuromuscular re-education 97112    Gait training 97116    PBM     Self Management 97535 5 mins   10:47 am - 11:32 am = 45 mins     Chief Complaint: deep bilateral breast pain following plastic surgery  Reason for Referral: Bilateral breast pain not improved with supportive measures   Referring Physician: Delana Meyer, PA-C     Patient Comments: Rachel [name] reports completing scar massage however, not other exercises due to not receiving e-mail. Reports a significant reduction in pain only one episode sharp pain on left breast for a few moments that quickly resolved with scar massage.  She happily reports a significant improvement compared to pain everyday to only experiencing pain 1 day this past week. She reports being hopeful she can complete scar mobilization and therapeutic exercises at home on her own due to significant improvement.  Response to Treatment: good understanding of home exercise program, improve soft tissue mobility along bilateral breast surgical scars  HEP Compliance: to be completed 1x a day    EXAMINATION FINDINGS    System Review  Cardiovascular/Pulmonary  HR (bpm): 87  O2 saturation: 98 %    Integumentary System:   Scars Bilateral inferior breast post saline implants    Edema None appreciated   Ecchymosis none   Erythema none   Integrity Clean, dry, intact  Medial scars with good soft tissue mobility  Lateral scar with mild decrease in soft tissue mobility (left more significant than right)     Pain:     Present: 0 Best:  0 Worst: 10/10  Only once this week for a few moments   Reports takes breath away     Location Bilateral breast pain lateral   Characteristics achy   Activity that cause exacerbation/relief Better with scar mobilization   Functional limitations Quality of life, sleeping on left side   Reports chronic low back - discussed discussing with her PCP regarding pain or possible referral to pelvic health PT    Musculoskeletal:  ROM: previous visit  Joint GH  History of right rotator cuff repair       Right Left    Flexion 165 170    Extension      Abduction 165 170    Adduction      Internal Rotation L5 L5    External Rotation C5 C5     Manual Muscle Testing  Bilateral Shoulder:  4+/5    Bilateral Elbow: 4+/5    Bilateral Wrist: 4+/5      Functional mobility:   Functional Test Scores:   Grip Strength: complete next apt   QuickDASH: eval 10/31/23 38.6    Observation/Posture:  Mild rounding of bilateral shoulders, mild forward head     Skilled Services Provided:     Therapeutic Exercise:   Access Code: [email or web address]  E-mail and printed out for patient  URL: [email or web address]  Date: 10/31/2023  Program Notes  scar massage: flat fingers, small circles work up to 3-5 mins every day    Exercises  - Seated Bicep Curls Supinated with Dumbbells  - 1 x daily - 7 x weekly - 2 sets - 10 reps  - shoulder blade squeeze with palms up  - 1 x daily - 7 x weekly - 2 sets - 10 reps  - Standing Shoulder Row with Anchored Resistance  - 1 x daily - 7 x weekly - 2 sets - 10 reps  - Corner Stretch  - 1 x daily - 7 x weekly - 2 sets - 10 reps    Manual Therapy:  - scar tissue mobilization to bilateral breast implant incisions   - soft tissue mobilization including trigger point release to bilateral pectoralis and subscapularis   Scar tissue mobilization: use flat fingers in small circular motions, gentle pressure and work up to firm pressure, no pain. Start with one minute a day and work up to 3-5 mins everyday.     Self Management:  -  patient asked about PT opinion for compression bra due to pain. discussed the benefits of compression bras to help reduce pain although due to significant improvement patient may not require compression bra.  Patient verbalized she would like to by one. Fitted patient for medium Hugger ARIA from [employer] wear. Patient is aware freedom of choice with compression bras    Therapeutic Activities: not performed  Gait Training: not performed  Neuromuscular re-education: plan to add NEXT apt   - Verbal and tactile cues for diaphragmatic breathing.  Discussed benefits of diaphragmatic breathing including activation of lymphatic system, reduction in sympathetic nervous system, pain reduction.    Assessment: Rachel [name] has shown significant improvement with PT interventions from reporting everyday experiencing 10/10 pain to now only 1 day of brief pain. She had another beautiful response to manual therapy with a palpable increase in soft tissue mobility along bilateral implant scars, patient reported tenderness occasionally with manual therapy. She demonstrated proper form and good understanding of all exercises after verbal and tactile cues.  She is progressing nicely towards her goals.      Plan:  Patient will return to physical therapy next week  PLAN OF CARE   Treatment intensity: 1x per week with plan to increase to 1x every 2 weeks  Duration: 3 months   Interventions  Therapeutic Exercise:  Scapular and shoulder strengthening, stretching of the pectoralis muscles, thoracic and lumbar spine stabilization    Neuromuscular Re-education:  Diaphragmatic breathing, scapular muscle activation, pain science   Therapeutic Activities:  ADLs and IADL modification, functional exercises to improve ability to return to everyday activities  Manual Therapy:  Soft tissue mobilization including trigger point release, myofascial release, and scar mobilization  Endurance Training:  Walking program, general exercise  guidelines    Goals:  Decrease QuickDASH score to 25 to improve ability to return to recreation  Increase bilateral upper extremity strength to globally 5/5 to improve ability to return to recreation  Reduce breast pain maximum from 10/10 to no more than 3/10  Patient will be independent with home exercise program    Estimated total visit 12. Visit mays changed based on patients ongoing treatment

## 2023-11-08 ENCOUNTER — Ambulatory Visit: Payer: Medicare PPO | Attending: INTERNAL MEDICINE | Admitting: INTERNAL MEDICINE

## 2023-11-08 ENCOUNTER — Ambulatory Visit (INDEPENDENT_AMBULATORY_CARE_PROVIDER_SITE_OTHER): Payer: Self-pay

## 2023-11-08 ENCOUNTER — Encounter (INDEPENDENT_AMBULATORY_CARE_PROVIDER_SITE_OTHER): Payer: Self-pay | Admitting: INTERNAL MEDICINE

## 2023-11-08 ENCOUNTER — Ambulatory Visit (INDEPENDENT_AMBULATORY_CARE_PROVIDER_SITE_OTHER): Payer: Self-pay | Admitting: INTERNAL MEDICINE

## 2023-11-08 VITALS — BP 117/62 | HR 82 | Ht 63.5 in | Wt 120.2 lb

## 2023-11-08 DIAGNOSIS — Z23 Encounter for immunization: Secondary | ICD-10-CM | POA: Insufficient documentation

## 2023-11-08 DIAGNOSIS — J309 Allergic rhinitis, unspecified: Secondary | ICD-10-CM | POA: Insufficient documentation

## 2023-11-08 MED ORDER — CETIRIZINE 10 MG TABLET
10.0000 mg | ORAL_TABLET | Freq: Every day | ORAL | 0 refills | Status: DC | PRN
Start: 2023-11-08 — End: 2024-02-11

## 2023-11-09 NOTE — Progress Notes (Signed)
INTERNAL MEDICINE   Medical Group Practice at [facility]  Return Visit Progress Note     Name: Rachel [name] Date of service: 11/08/2023   MRN:  [id number]  Age/DOB: 82 y.o., [birth year] PCP:  Reymundo Poll, MD  Reason for Visit: Allergic sinusitis     Subjective:      Rachel [name] is a 82 y.o. female with PMH of interstitial cystitis, breast implants s/p removal and osteoarthritis,  who presents to the clinic with concerns of allergic sinusitis.    She started to complain about sinusitis 2 months ago which was constant and not improving with the Allegra and she was wondering if she can try another medication. She describes her symptoms as pressure pain in her cheeks and forehead.  She also complained about dryness in her throat, with yellowish sputum production, a teaspoon in amount.   She has shortness of breath on exertion not affecting her daily activity.  She denied hearing problems or recent vision changes      Medical History:      Past Medical History, Allergies, Surgical History, Family, and Social History were reviewed  on   11/09/2023 and updated as needed     MEDICATIONS:  Outpatient Medications Marked as Taking for the 11/08/23 encounter (Office Visit) with Karl Ito, MD   Medication Sig    azelastine (ASTELIN) 137 mcg (0.1 %) Nasal Spray, Non-Aerosol Administer 1 Spray into each nostril Twice daily Use in each nostril as directed Indications: seasonal runny nose    cetirizine (ZYRTEC) 10 mg Oral Tablet Take 1 Tablet (10 mg total) by mouth Once per day as needed for Allergies for up to 90 days    dexlansoprazole (DEXILANT) 30 mg Oral Cap, Delayed Rel., Multiphasic Take 1 Capsule (30 mg total) by mouth Once a day Indications: gastroesophageal reflux disease, failed protonix, pepcid, nexium    DULoxetine (CYMBALTA DR) 60 mg Oral Capsule, Delayed Release(E.C.) Take 1 Capsule (60 mg total) by mouth Once a day    estradioL (ESTRACE) 0.5 mg Oral Tablet Take 1 Tablet (0.5 mg total) by mouth Once a day     fexofenadine (ALLEGRA) 180 mg Oral Tablet Take 1 Tablet (180 mg total) by mouth Once a day    fluticasone propionate (FLONASE) 50 mcg/actuation Nasal Spray, Suspension SHAKE LIQUID AND USE 1 SPRAY IN EACH NOSTRIL TWICE DAILY    medroxyPROGESTERone (PROVERA) 2.5 mg Oral Tablet Take 1 Tablet (2.5 mg total) by mouth Once a day    solifenacin (VESICARE) 10 mg Oral Tablet Take 1 Tablet (10 mg total) by mouth Once a day for 360 days    tiZANidine (ZANAFLEX) 2 mg Oral Tablet Take 1 Tablet (2 mg total) by mouth Every night         Review Of Systems (Bold is Positive):      Constitutional - fevers, chills, weight loss, malaise, fatigue, sweats  HEENT - visual disturbance, eye redness, hearing loss, earaches, nasal congestion, sore throat, dysphagia  Respiratory -  cough, sputum, hemoptysis, shortness of breath  Cardiovascular - chest pain, orthopnea, lower extremity edema, palpitations  Gastrointestinal -  nausea, vomiting, diarrhea, constipation, blood in stool, abdominal pain  Genitourinary - dysuria, polyuria, urgency, hematuria  Integument- rashes, lesions, pruritis  Musculoskeletal - myalgias, arthralgias  Neurological - headaches, lightheadedness, weakness  Behavioral/Psych - anxiety, depressed mood  Endocrine - polydipsia, temperature intolerance    Physical Exam:     Vitals:  height is 1.613 m (5' 3.5") and weight is 54.5 kg (120  lb 2.4 oz). Her blood pressure is 117/62 and her pulse is 82. Her oxygen saturation is 96%.      General:  Well-nourished, well-developed, in no apparent distress  Head:  Normocephalic, atraumatic  Ears:  Tympanic membranes bulging bilaterally with fluid behind the tympanic membrane  Eyes:  Pupils equally round and reactive to light, extraocular muscles intact, sclareae non-icteric, conjunctivae clear  Nose:  Turbinates without inflammation or erythema  Throat:  Oropharynx clear, mucous membranes moist  Neck:  Trachea midline, no thyromegaly or lymphadenopathy  Cardiovascular:  Regular rate  and rhythm, no murmurs, rubs, gallops  Respiratory:  Clear to auscultation bilaterally, no wheezes, rhonchi, crackles  Abdominal:  Bowel sounds normal; abdomen soft, non-tender to palpation  Extremities:  No edema, dorsalis pedis pulses 2+ bilaterally  Skin:  Warm and dry  Neurological:  Awake, A&O x 3. CNII-XII, strength and sensation grossly intact  Psychiatric:  Normal mood & affect, thought process linear, speech content normal    Data Reviewed and Interpretation:     COMPLETE BLOOD COUNT   Lab Results   Component Value Date    WBC 3.2 (L) 12/31/2022    HGB 10.1 (L) 12/31/2022    HCT 30.3 (L) 12/31/2022    PLTCNT 206 12/31/2022       DIFFERENTIAL  Lab Results   Component Value Date    PMNS 48.6 12/31/2022    MONOCYTES 10.2 12/31/2022    BASOPHILS 0.6 12/31/2022    BASOPHILS <0.10 12/31/2022    PMNABS 1.53 12/31/2022    LYMPHSABS 0.93 (L) 12/31/2022    EOSABS 0.34 12/31/2022    MONOSABS 0.32 12/31/2022     BASIC METABOLIC PANEL  Lab Results   Component Value Date    SODIUM 136 12/31/2022    POTASSIUM 4.8 12/31/2022    CHLORIDE 105 12/31/2022    CO2 23 12/31/2022    ANIONGAP 8 12/31/2022    BUN 24 12/31/2022    CREATININE 0.83 12/31/2022    BUNCRRATIO 29 (H) 12/31/2022    GFR 71 12/31/2022    CALCIUM 8.3 (L) 12/31/2022    GLUCOSE Negative 12/31/2022            Assessment:     Rachel [name] is a 82 y.o. female with PMH of nterstitial cystitis, breast implants s/p removal and osteoarthritis,  who presents to the clinic with concerns of allergic sinusitis.      Plan:     Allergic sinusitis  Stop Allegra  Start Cetrizine  We will consider antibiotics if the patient condition has not improved    Healthcare maintenance   Covid-19 vaccine  Flu vaccine      Healthcare Maintenance:         Preventative Healthcare (Female):    Pap (>21yo q 3 years or q 5 years if negative HPV and >30yo)    Mammogram (43-74 yo, 40-50 if risk, > 75 if life expectancy > 10 yrs) Recent Results (from the past 57846 hour(s))   MAMMO BILATERAL  DIAGNOSTIC-ADDL VIEWS/BREAST US AS REQ BY RAD    Collection Time: 08/08/23  8:37 AM    Narrative    Computer aided detection (CAD) technology has been applied to the standard   (CC and MLO) images.    3D tomosynthesis and 2D images were acquired and reviewed    FILMS COMPARED:  No comparisons were made when reading this study.  The patient's most   recent mammogram was done at outside facility in August 2022 (prior to  implant removal), however prior exam is not available for comparison at   time of interpretation, therefore this is a baseline exam.    HISTORY:  Procedure: MAMMO BILAT DIAGNOSTIC W TOMO-ADDL VWS/BREAST US AS REQ BY RAD  Reason for exam: Breast pain      FINDINGS:  There are scattered areas of fibroglandular density.    Right  Focal Asymmetry: There is a focal asymmetry seen in the upper outer   quadrant of the right breast, 8 cm from the nipple. This persists on spot   compression views and likely localizes to the 10 to 11 o'clock region.   Recommend targeted right breast ultrasound.    Left  Focal Asymmetry:  There is a focal asymmetry seen in the upper outer   region of the left breast, 4 cm from the nipple. This persists on spot   compression views. Recommend targeted left breast ultrasound at 12-3   o'clock.    Bilateral  Post-Surgical Finding: There are post-surgical findings seen in both   breasts.         Impression    Ultrasound is recommended for the left breast at 12-3 o'clock.  Ultrasound is recommended for the right breast at 10-11 o'clock.    BI-RADS ATLAS category (overall): 0 - Incomplete: Needs Additional   Imaging Evaluation          Colonoscopy (50-75 average risk or age 26 or 10 years prior to 1st degree relative diagnosis)    CT chest (>30 pack year smoking history; yearly starting at age 71-80, current smoker or quit within 15 years)    Bone Density (>65 or with risk factors) No results found for this or any previous visit (from the past [id number] hour(s)).   Hemoglobin A1c  (every 3 years if BMI >25 and additional risk factor - ADA)     Lipids Lab Results   Component Value Date    CHOLESTEROL 230 (H) 09/13/2021    HDLCHOL 72 09/13/2021    LDLCHOL 125 (H) 09/13/2021    TRIG 167 (H) 09/13/2021      The ASCVD Risk score (Arnett DK, et al., 2019) failed to calculate for the following reasons:    The 2019 ASCVD risk score is only valid for ages 29 to 27    The patient has a prior MI or stroke diagnosis   Depression Screening with PHQ-2 (yearly)    HIV screening (one time)    HCV screening (one time)    Tetanus Booster (every 10 years, 5 years if wound)    Flu Shot (yearly)    Pneumonia Vaccine (>65yo, or CAD/DM/COPD/liver disease, or smoking every 5 years or once after 65) PCV13:  PCV23:   Zoster vaccination (>50yo)        Follow Up:     Return to clinic in 6 month or as needed  Labs and Imaging Needing Follow Up:  nothing  Goals for Next Visit:  Resolution of congestion     Patient had no questions regarding treatment plan, goals, risks or benefits and agrees to contact me or my clinic in the interim should any questions or problems arise.    Karl Ito, MBBS  PGY-1, Department of Internal Medicine    Attending Attestation    I saw and examined the patient.  I reviewed the resident's note.  I agree with the findings and plan of care as documented in the resident's note.  Any exceptions/additions are edited/noted.    Assessment/Plan   1.  Allergic sinusitis          Chad Cordial, DO  [facility]  Department of Internal Medicine

## 2023-11-11 NOTE — Addendum Note (Signed)
Addended by: Ronny Flurry on: 11/11/2023 11:41 AM     Modules accepted: Level of Service

## 2023-11-14 ENCOUNTER — Encounter (INDEPENDENT_AMBULATORY_CARE_PROVIDER_SITE_OTHER): Payer: Self-pay

## 2023-11-14 ENCOUNTER — Other Ambulatory Visit (HOSPITAL_BASED_OUTPATIENT_CLINIC_OR_DEPARTMENT_OTHER): Payer: Self-pay | Admitting: Student in an Organized Health Care Education/Training Program

## 2023-11-14 DIAGNOSIS — M1990 Unspecified osteoarthritis, unspecified site: Secondary | ICD-10-CM

## 2023-11-14 MED ORDER — OXYCODONE 10 MG TABLET
10.0000 mg | ORAL_TABLET | Freq: Four times a day (QID) | ORAL | 0 refills | Status: DC | PRN
Start: 2023-11-14 — End: 2023-12-11

## 2023-11-14 NOTE — Telephone Encounter (Addendum)
Regarding: Refill Request  Message from Lum Babe sent at 11/14/2023  2:08 PM EST   Copied From CRM [id number].[name], Rachel A called to request a prescription refill.   Reymundo Poll MD    Rx request: Oxycodone 10 mg taking every six hours as needed for pain. ((pt only has four pills left)     [id number] - [place], [place] - [address] AT [facility] OF [address]  [address] [facility] [address]  Phone: [phone] Fax: [phone]  Hours: Not open 24 hours    07-29-24 next apt  11-08-23 last apt (acute)    Medical Group Practice  [address]  [place], [place] [address]  Pain contract on file/ up to date? Listed as on file as of July 26, 2023.   Urine drug screen within the last year? Not listed as on file.  Appointment with PCP within the last three months? Completed office visit with PCP on 8.2.24. Completed office visit at P[facility] on 11.15.24.

## 2023-11-14 NOTE — Telephone Encounter (Signed)
Covering for Dr Allean Found. Reviewed PDMP. Pain contract in place 07/2023. Refilled oxycodone.    Rachel Haw, MD 11/14/2023, 14:23  Department of Internal Medicine  [facility]  [facility]

## 2023-12-03 ENCOUNTER — Other Ambulatory Visit (HOSPITAL_BASED_OUTPATIENT_CLINIC_OR_DEPARTMENT_OTHER): Payer: Self-pay | Admitting: Student in an Organized Health Care Education/Training Program

## 2023-12-03 DIAGNOSIS — N3010 Interstitial cystitis (chronic) without hematuria: Secondary | ICD-10-CM

## 2023-12-11 ENCOUNTER — Other Ambulatory Visit (HOSPITAL_BASED_OUTPATIENT_CLINIC_OR_DEPARTMENT_OTHER): Payer: Self-pay | Admitting: INTERNAL MEDICINE

## 2023-12-11 ENCOUNTER — Ambulatory Visit: Payer: Medicare PPO | Attending: GENERAL SURGERY | Admitting: Medical

## 2023-12-11 ENCOUNTER — Other Ambulatory Visit: Payer: Self-pay

## 2023-12-11 DIAGNOSIS — M1990 Unspecified osteoarthritis, unspecified site: Secondary | ICD-10-CM

## 2023-12-11 DIAGNOSIS — Z09 Encounter for follow-up examination after completed treatment for conditions other than malignant neoplasm: Secondary | ICD-10-CM

## 2023-12-11 MED ORDER — OXYCODONE 10 MG TABLET
10.0000 mg | ORAL_TABLET | Freq: Four times a day (QID) | ORAL | 0 refills | Status: DC | PRN
Start: 2023-12-11 — End: 2024-02-04

## 2023-12-11 NOTE — Telephone Encounter (Signed)
Covering for Reymundo Poll, MD  Pain agreement up to date  Needs appt within next month for pain contract please.   Refill sent

## 2023-12-11 NOTE — Progress Notes (Signed)
BREAST CLINIC, [name]  [address]  [place] [address]  Operated by [facility]  Telephone Visit    Name:  Rachel [name] MRN: [id number]   Date:  12/11/2023 Age:   82 y.o.     The patient initiated a request for telephone service.  Verbal consent for this service was obtained from the patient.     Last office visit in this department: 08/01/2023      Reason for call: Follow up breast pain     Hx:   Rachel [name] has a history of bilateral breast implants placed in 1974 that subsequently ruptured and were replaced in 1994. She initially presented to plastic surgery September of 09/18/2021 where she complained of bilateral breast pain and was taken to the OR shortly after on 01/23/2021 for removal of bilateral saline implants. She states that since then, she has had progressively worsening bilateral breast pain.  She saw plastic surgery 06/25/23 for this complaint and was referred to breast surgery for additional care and evaluation.     Family hx was reviewed and unremarkable for cancer. Specifically, she denies ovarian or breast cancers.     Last telephone follow up was 10/09/23 at which time her reported breast pain was "the same." She was referred to PT for additional evaluation.     Call notes:  During today's visit, Rachel [name] reports her breast pain is improving. She did notice a RIGHT breast pain yesterday but this has resolved. She has been seeing PT as recommended. Per EMR, her breast pain is improving with scar mobilization and therapeutic exercises. She is currently following with PT every two weeks but has had trouble with transportation lately so is over due currently. She is doing her home exercises. No new breast complaints.     ROS: negative     Assessment: Breast pain improving with PT.     Plan:  - Cont PT and conservative measures for breast pain.  - Follow up L and R mammogram recommended in 6 mo (due 01/2024) and has been scheduled. She will RTC at that time  for repeat exam.    -- This is for follow up of L and R breast BIRADS 3 findings.   - Cont self breast exams.   - call with questions, concerns, or changes.       ICD-10-CM    1. Follow-up exam  Z09             Total provider time spent with the patient on the phone: 2 minutes.    Rachel Meyer, PA-C

## 2023-12-21 ENCOUNTER — Other Ambulatory Visit (INDEPENDENT_AMBULATORY_CARE_PROVIDER_SITE_OTHER): Payer: Self-pay | Admitting: INTERNAL MEDICINE

## 2023-12-21 ENCOUNTER — Other Ambulatory Visit (HOSPITAL_BASED_OUTPATIENT_CLINIC_OR_DEPARTMENT_OTHER): Payer: Self-pay | Admitting: Internal Medicine

## 2023-12-23 MED ORDER — SUMATRIPTAN 100 MG TABLET
100.0000 mg | ORAL_TABLET | Freq: Once | ORAL | 0 refills | Status: DC | PRN
Start: 2023-12-23 — End: 2024-02-03

## 2023-12-24 ENCOUNTER — Other Ambulatory Visit: Payer: Self-pay

## 2023-12-24 ENCOUNTER — Encounter (HOSPITAL_BASED_OUTPATIENT_CLINIC_OR_DEPARTMENT_OTHER): Payer: Self-pay

## 2023-12-24 ENCOUNTER — Ambulatory Visit: Payer: Medicare PPO | Attending: Internal Medicine

## 2023-12-24 VITALS — BP 132/72 | HR 98 | Temp 98.2°F | Ht 63.82 in | Wt 121.5 lb

## 2023-12-24 DIAGNOSIS — G894 Chronic pain syndrome: Secondary | ICD-10-CM | POA: Insufficient documentation

## 2023-12-24 DIAGNOSIS — M150 Primary generalized (osteo)arthritis: Secondary | ICD-10-CM | POA: Insufficient documentation

## 2023-12-24 NOTE — Progress Notes (Signed)
INTERNAL MEDICINE, [facility]  [address]  [place] [address]  Operated by [facility]  Acute Visit  Name: Rachel [name] MRN:  [id number]   Date: 12/24/2023 DOB:  [birth year] (82 y.o.)   PCP: Reymundo Poll, MD    Chief Complaint  Follow Up 3 Months    Subjective   Rachel [name] is a 82 y.o. female with a past medical history of interstitial cystitis, migraines, and osteoarthritis who presents to clinic for routine follow-up due to active pain contract.    Ms. Skufca reports feeling okay today. She had a mechanical fall recently after tripping over a stool while reaching for her dog's toy. She continues to have some right arm soreness. Previously had rotator cuff surgery but thinks her current symptoms are related to bruising.    As far as her pain is concerned, she notes that she has good response to Tylenol. She wonders if she can safely take more Tylenol. We discussed taking scheduled Tylenol not exceeding 4000 mg daily and decreasing Oxycodone as tolerated. She is agreeable to this.    Also complained of chronic sinus congestion, but is going to [place] soon and hopes it will help with her symptoms. Continues to use Astelin spray and Zyrtec at this time.    Past medical, surgical, family, and social history and allergies were reviewed on 12/24/2023 and updated as needed.    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    Foot pain, bilateral    History of foot surgery    Toe pain, bilateral    Tailor's bunion of right foot    Valgus deformity of both great toes    Fat pad atrophy of foot    Mood  disorder (CMS HCC)    Other nonthrombocytopenic purpura    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)        Current Outpatient Medications   Medication Instructions    azelastine (ASTELIN) 137 mcg (0.1 %) Nasal Spray, Non-Aerosol 1 Spray, Each Nostril, 2 TIMES DAILY, Use in each nostril as directed    cetirizine (ZYRTEC) 10 mg, Oral, DAILY PRN    dexlansoprazole (DEXILANT) 30 mg, Oral, DAILY    DULoxetine (CYMBALTA DR) 60 mg, Oral, DAILY    estradioL (ESTRACE) 0.5 mg, Oral, DAILY    medroxyPROGESTERone (PROVERA) 2.5 mg, Oral, DAILY    ondansetron (ZOFRAN ODT) 4 mg, Oral, EVERY 8 HOURS PRN    Oxycodone (ROXICODONE) 10 mg, Oral, EVERY 6 HOURS PRN    solifenacin (VESICARE) 10 mg, Oral, DAILY    sumatriptan succinate (IMITREX) 100 mg, Oral, ONCE PRN, May repeat in 2 hours in needed    tiZANidine (ZANAFLEX) 2 mg, Oral, NIGHTLY         Objective   BP 132/72 (Site: Left Arm, Patient Position: Sitting, Cuff Size: Adult)   Pulse 98   Temp 36.8 C (98.2 F) (Temporal)   Ht 1.621 m (5' 3.82")   Wt 55.1  kg (121 lb 7.6 oz)   SpO2 99%   BMI 20.97 kg/m   General: No acute distress.  Cardiovascular: Appears well-perfused.  Respiratory: Breathing comfortably on ambient air.  Abdominal: Not visibly distended.  Extremities: No peripheral edema. No pain along left shoulder joint line. Tenderness on soft tissue overlying the left shoulder and upper arm.  Skin: No rashes on exposed skin.  Psych: Normal mood and congruent affect.     Assessment & Plan  Primary osteoarthritis involving multiple joints  Routine follow-up for pain contract;  PDMP reviewed.   Failed NSAIDs previously;  Cannot use topical lidocaine (adhesive allergy)   Patient will utilize Tylenol 1000 mg Q4H while awake to minimize opioid use.  Routine follow-up with PCP in 3 months.    Chronic pain syndrome        Barbette Or, MD      I discussed the patient's care with the Resident prior to the patient leaving the clinic. Any significant discussion points are  noted .  Jana Half, MD

## 2023-12-24 NOTE — Assessment & Plan Note (Addendum)
Routine follow-up for pain contract;  PDMP reviewed.   Failed NSAIDs previously;  Cannot use topical lidocaine (adhesive allergy)   Patient will utilize Tylenol 1000 mg Q4H while awake to minimize opioid use.  Routine follow-up with PCP in 3 months.

## 2023-12-24 NOTE — Addendum Note (Signed)
Addended by: Jana Half on: 12/24/2023 01:04 PM     Modules accepted: Level of Service

## 2024-01-13 ENCOUNTER — Encounter (HOSPITAL_COMMUNITY): Payer: Self-pay

## 2024-02-03 ENCOUNTER — Other Ambulatory Visit (HOSPITAL_BASED_OUTPATIENT_CLINIC_OR_DEPARTMENT_OTHER): Payer: Self-pay | Admitting: Internal Medicine

## 2024-02-03 MED ORDER — SUMATRIPTAN 100 MG TABLET
100.0000 mg | ORAL_TABLET | Freq: Once | ORAL | 0 refills | Status: AC | PRN
Start: 2024-02-03 — End: ?

## 2024-02-04 ENCOUNTER — Ambulatory Visit (HOSPITAL_BASED_OUTPATIENT_CLINIC_OR_DEPARTMENT_OTHER): Payer: Self-pay | Admitting: Student in an Organized Health Care Education/Training Program

## 2024-02-04 ENCOUNTER — Other Ambulatory Visit (HOSPITAL_BASED_OUTPATIENT_CLINIC_OR_DEPARTMENT_OTHER): Payer: Self-pay | Admitting: Student in an Organized Health Care Education/Training Program

## 2024-02-04 DIAGNOSIS — M1990 Unspecified osteoarthritis, unspecified site: Secondary | ICD-10-CM

## 2024-02-04 NOTE — Telephone Encounter (Signed)
Medical Group Practice  [address]  [place], [place] [address]       PATIENT NAME: Rachel [name]  MRN: [id number]  DOB: 04/09/1941        Date: 02/04/2024  Time: 09:37      Called [facility]. Spoke to [facility]. Informed Amber the max daily dose for Imitrex should be 2 tablets, and the 12 tablets should be a month supply (up to six headaches per month). Amber verbalized understanding.      Lynda Rainwater, BSN RN  Clinical Nurse Coordinator

## 2024-02-04 NOTE — Telephone Encounter (Signed)
Regarding: Refill Request  ----- Message from [facility] D sent at 02/04/2024  2:52 PM EST -----  Copied From CRM #[id number].  Doctor Name:  Reymundo Poll, MD       Date of last appointment: 12/24/23    Next scheduled visit: 08/03/24    Medication Requested:     Oxycodone (ROXICODONE) 10 mg Oral Tablet    Preferred Pharmacy     [facility] #[id number] - [place], [place] - [address] AT   [facility] OF [address]    [address] [facility] [address]    Phone: [phone] Fax: [phone]    Hours: Not open 24 hours

## 2024-02-04 NOTE — Telephone Encounter (Signed)
-----   Message from Nida Boatman, RN sent at 02/03/2024  4:25 PM EST -----  Regarding: FW: Clinical Question  ----- Message from Roselie Awkward sent at 02/03/2024  4:24 PM EST -----  Copied From CRM [phone].  Bolivar Haw, MD    Clydie Braun with [facility] called stating the following rx was sent in today and does not have any limitations on it. Clydie Braun is stating typically there is a 30 day limitation. The manufacturer typically recommends an end date. Please call Clydie Braun to advise. Thank you!    sumatriptan succinate (IMITREX) 100 mg Oral Tablet

## 2024-02-05 ENCOUNTER — Other Ambulatory Visit (HOSPITAL_BASED_OUTPATIENT_CLINIC_OR_DEPARTMENT_OTHER): Payer: Self-pay | Admitting: Internal Medicine

## 2024-02-05 ENCOUNTER — Telehealth (HOSPITAL_BASED_OUTPATIENT_CLINIC_OR_DEPARTMENT_OTHER): Payer: Self-pay | Admitting: INTERNAL MEDICINE

## 2024-02-05 DIAGNOSIS — M1990 Unspecified osteoarthritis, unspecified site: Secondary | ICD-10-CM

## 2024-02-05 MED ORDER — OXYCODONE 10 MG TABLET
10.0000 mg | ORAL_TABLET | Freq: Four times a day (QID) | ORAL | 0 refills | Status: DC | PRN
Start: 2024-02-05 — End: 2024-03-17

## 2024-02-05 NOTE — Telephone Encounter (Signed)
Received fax from [facility] that needs completed/signed. Placing it in your MGP-UTC box.     Laurell Cassville, RN 02/05/2024, 08:41

## 2024-02-05 NOTE — Telephone Encounter (Signed)
I am covering for Dr. Allean Found.   Controlled substances database reviewed.  Refills sent electronically to patient's pharmacy.  Sharlyn Bologna, MD  02/05/2024, 16:02

## 2024-02-06 NOTE — Telephone Encounter (Signed)
Received fax from [facility] stating they provided patient with a temporary supply of sumatriptan, but will not longer be covering it. Fax states that an alternative drug needs to be prescribed and they provided a website for formulary medications, was unable to find list of formulary medications on website. Called [facility] and they are sending a list of alternatives for sumatriptan. I believe the quantity limit for sumatriptan is 9 tablets per 30 days. Waiting on formulary list via fax. Ronnald Ramp, MA

## 2024-02-06 NOTE — Telephone Encounter (Signed)
Received alternative medication list for sumatriptan, Alternatives are Aimovig auto injector 140 mg/ml, Aimovig auto injector 70 mg/ml, Ajovy auto injector 225 mg/ 1.5 ml, Ajovy syringe subcutaneous 225 mg/ 1.5 ml, dihydroergotamine nasal spray 0.5 mg/ pump act, (4 mg/ml), Emgality subcutaneous pen injector 120 mg/ml, Emgality syringe subcutaneous 120 mg/ml, Emgality subcutaneous syringe 300 mg/3 ml, ergotamine- caffeine oral tab 1-100 mg, or naratriptan 1 mg oral tab. Usually tripan medications are only approved for 9 tab/ month. Please advise. Ronnald Ramp, MA

## 2024-02-07 ENCOUNTER — Other Ambulatory Visit (HOSPITAL_BASED_OUTPATIENT_CLINIC_OR_DEPARTMENT_OTHER): Payer: Self-pay | Admitting: OBSTETRICS/GYNECOLOGY

## 2024-02-07 ENCOUNTER — Other Ambulatory Visit (HOSPITAL_BASED_OUTPATIENT_CLINIC_OR_DEPARTMENT_OTHER): Payer: Self-pay | Admitting: Internal Medicine

## 2024-02-07 ENCOUNTER — Encounter (HOSPITAL_BASED_OUTPATIENT_CLINIC_OR_DEPARTMENT_OTHER): Payer: Self-pay

## 2024-02-07 ENCOUNTER — Other Ambulatory Visit (HOSPITAL_BASED_OUTPATIENT_CLINIC_OR_DEPARTMENT_OTHER): Payer: Self-pay | Admitting: INTERNAL MEDICINE

## 2024-02-07 DIAGNOSIS — M1990 Unspecified osteoarthritis, unspecified site: Secondary | ICD-10-CM

## 2024-02-07 DIAGNOSIS — N3010 Interstitial cystitis (chronic) without hematuria: Secondary | ICD-10-CM

## 2024-02-10 ENCOUNTER — Encounter (HOSPITAL_BASED_OUTPATIENT_CLINIC_OR_DEPARTMENT_OTHER): Payer: Self-pay

## 2024-02-11 ENCOUNTER — Ambulatory Visit (HOSPITAL_COMMUNITY): Payer: Self-pay

## 2024-02-11 ENCOUNTER — Other Ambulatory Visit: Payer: Self-pay

## 2024-02-11 ENCOUNTER — Other Ambulatory Visit (INDEPENDENT_AMBULATORY_CARE_PROVIDER_SITE_OTHER): Payer: Self-pay | Admitting: INTERNAL MEDICINE

## 2024-02-11 ENCOUNTER — Ambulatory Visit: Payer: Medicare PPO | Attending: PHYSICIAN ASSISTANT | Admitting: INTERNAL MEDICINE

## 2024-02-11 ENCOUNTER — Encounter (INDEPENDENT_AMBULATORY_CARE_PROVIDER_SITE_OTHER): Payer: Self-pay | Admitting: INTERNAL MEDICINE

## 2024-02-11 VITALS — BP 140/64 | HR 84 | Temp 96.8°F | Ht 64.0 in | Wt 120.2 lb

## 2024-02-11 DIAGNOSIS — H538 Other visual disturbances: Secondary | ICD-10-CM | POA: Insufficient documentation

## 2024-02-11 DIAGNOSIS — J329 Chronic sinusitis, unspecified: Secondary | ICD-10-CM | POA: Insufficient documentation

## 2024-02-11 DIAGNOSIS — R739 Hyperglycemia, unspecified: Secondary | ICD-10-CM | POA: Insufficient documentation

## 2024-02-11 MED ORDER — FEXOFENADINE 60 MG TABLET
180.0000 mg | ORAL_TABLET | Freq: Every day | ORAL | 2 refills | Status: DC
Start: 2024-02-11 — End: 2024-02-27

## 2024-02-11 MED ORDER — FLUTICASONE PROPIONATE 50 MCG/ACTUATION NASAL SPRAY,SUSPENSION
1.0000 | Freq: Every day | NASAL | 2 refills | Status: DC
Start: 2024-02-11 — End: 2024-02-27

## 2024-02-11 NOTE — Progress Notes (Signed)
BREAST CLINIC, [facility]  [address]  [place] [address]    Chief Complaint: Routine follow up: hx of breast pain; hx of breast implants     JAQUALA [name] is an 83 y.o.  female who returns to clinic today for routine follow up. She was last seen on 12/11/23 for telephone visit.    Breast hx includes: bilateral breast implants placed in 1974 that subsequently ruptured and were replaced in 1994. She initially presented to plastic surgery September of 09/18/2021 where she complained of bilateral breast pain and was taken to the OR shortly after on 01/23/2021 for removal of bilateral saline implants. She states that since then, she has had progressively worsening bilateral breast pain.  She saw plastic surgery 06/25/23 for this complaint and was referred to breast surgery for additional care and evaluation.   At that time of last telephone visit, 12/11/23, she noted breast pain to be improving.    During today's visit, she reports she is doing well. Bilateral breast pain has improved as it is now occurring "much less." The pain is worse with a bra and better when she holds her breast when the pain occurs. She notes some new acne to her chest wall otherwise she denies palpation of any new masses, change in size, symmetry, or skin changes of her breasts, nipple retraction, ulceration, discharge or axillary adenopathy.  She completed a bilateral mammogram prior to today's appt (BIRADS 3 follow up).     Date of last Mammogram: 02/19/205, b/l diagnostic mammogram, BIRADS 2.     Breast density: B- scattered    Denies new CA Dx in her Family since last visit .  Denies any new changes in her PMH, PSxH, Social Hx, or FHx since her visit on 12/11/23 other than as discussed above.    ROS:  General: Very pleasant; Denies fever or chills  Head: denies recent trauma or persistent light headedness   EENT: Denies double vision, tinnitus, nasal congestion, or trouble with swallowing  Cardiovascular: Denies Chest pain or  palpitations  Respiratory: Denies SOB or cough  Gastrointestinal: + baseline constipation. Denies abdominal pain, Nausea/ vomiting, diarrhea  GU: Denies dysuria or hematuria  Musculoskeletal: Denies weakness or arthritis  Neurological: +RLS.  Denies HA or dizziness  Psych: + trouble sleeping recently. denies anxiety or depression     Objective:   Flowsheet Row Office Visit from 02/12/2024 in Breast Clinic, [name]   [facility]   Temperature 36.5 C (97.7 F) filed at... 02/12/2024 1151   Heart Rate 82 filed at... 02/12/2024 1151   Respiratory Rate 16 filed at... 02/12/2024 1151   BP (Non-Invasive) 148/64  [MD Notified] filed at... 02/12/2024 1151   SpO2 100 % filed at... 02/12/2024 1151   Height 1.6 m (5\' 3" ) filed at... 02/12/2024 1151   Weight 54.6 kg (120 lb 6.4 oz) filed at... 02/12/2024 1151   BMI (Calculated) 21.37 filed at... 02/12/2024 1151   BSA (Calculated) 1.56 filed at... 02/12/2024 1151      Physical Exam:   Constitutional: Very pleasant, well nourished female in no apparent distress  HNT: Normocephalic, atraumatic.  Neck is supple, no supra/infraclavicular or cervical adenopathy appreciated    Cardiovascular: Heart is of regular rate and rhythm  Pulmonary: normal pulmonary effort and rate is observed  BREAST:    RIGHT breast: breast is symmetric in shape.Well healed IMF and periareolar scar.  Negative for masses, dimpling or tenderness    Areola: consistent pigmentation, negative for ulceration.  Nipple:  No evidence of ulceration, discharge or retraction    Axilla: Negative for Palpable LN     LEFT breast: breast is symmetric in shape. .Well healed IMF and periareolar scar. Negative for masses, dimpling or tenderness    Areola: consistent pigmentation, negative for ulceration.    Nipple:  No evidence of ulceration, discharge or retraction    Axilla: Negative for Palpable LN  Abdomen: soft and non tender, non distended   Skin: warm and dry without rash  Psychiatric: Alert and  Oriented.      IMAGING:  Recent Results (from the past [address] hours)   MAMMO BILATERAL DIAGNOSTIC-ADDL VIEWS/BREAST US AS REQ BY RAD    Collection Time: 02/12/24 11:04 AM    Narrative    Computer aided detection (CAD) technology has been applied to the standard   (CC and MLO) images.    3D tomosynthesis and 2D images were acquired and reviewed    FILMS COMPARED:  Compared to: 08/08/2023 MAMMO BILATERAL DIAGNOSTIC-ADDL VIEWS/BREAST US AS   REQ BY RAD, 08/08/2023 US BREAST RIGHT-ADDL VIEWS/BREAST US AS REQ BY RAD,   and 08/08/2023 US BREAST LEFT-ADDL VIEWS/BREAST US AS REQ BY RAD    HISTORY:  Procedure: MAMMO BILAT DIAGNOSTIC W TOMO-ADDL VWS/BREAST US AS REQ BY RAD  Reason for exam: Other abnormal and inconclusive findings on diagnostic   imaging of breast      FINDINGS:  There are scattered areas of fibroglandular density.  The patient has a   history of asymmetry upper-outer aspect both breasts.  There is also   history of implant removal bilaterally.    The current mammogram is stable with the asymmetries not changing.  There   are no new findings both breasts.          Impression    Follow up mammogram in 1 year is recommended for both breasts.    BI-RADS ATLAS category (overall): 2 - Benign      [facility]  [place], [place] [address]  [phone]    Radiologist location ID: [id number]           Assessment and Plan:    ICD-10-CM    1. Normal breast exam  Z00.00       2. Breast cancer screening by mammogram  Z12.31 MAMMO BILATERAL SCREENING-ADDL VIEWS/BREAST US AS REQ BY RAD      3. S/p breast implant removal  Z98.86         The patient's examination and imaging were discussed with her today. Clinical breast exam revealed well healed bilateral breast scars. No palpable masses, skin change or lymphadenopathy noted. She completed a bilateral mammogram prior to today's appt which was benign, noting hx of bilateral UO asymmetries to be stable. Removal of her breast implants have been noted. This was benign.  Next mammogram due in one year and an order was placed.She will return to the breast clinic in one year, sooner if needed, for repeat clinical breast exam. In the interim she will continue her self breast examinations. Breast density was reviewed and noted to be scattered and does not warrant supplemental breast imaging at this time for this reason. She was given the opportunity to ask questions and those questions were answered to her satisfaction. She was encouraged to call with any additional questions or concerns.    Of note, reports complaints with her RLS and trouble sleeping, she was encouraged to follow up with PCP.    Delana Meyer, PA-C  02/12/2024  12:14  AO:ZHYQ Dbaibou, MD    On the day of the encounter, a total of 14 minutes was spent on this patient encounter including review of historical information, examination, documentation and post-visit activities. The time documented excludes procedural time.

## 2024-02-11 NOTE — Progress Notes (Signed)
INTERNAL MEDICINE   [facility]  Acute Patient Visit     Name: Rachel [name] Date of Service: 02/11/2024   MRN:  [id number]  Age/DOB: 83 y.o., 1941-10-05 PCP:  Reymundo Poll, MD  Reason for Visit: Discuss alternative HA medications     Subjective:      Rachel [name] is a 83 y.o. female with PMH of  osteoarthritis, migraines, & chronic pain who presents to the clinic for follow-up of chronic allergies/sinusitis & associated headaches. Patient reports that she has had long-standing issues with allergies & sinus trouble since she was a young girl. She noted that her headaches just started to appear over the last 4 years. She denied prior history of migraine headaches. Patient reported that she feels her headaches are primarily the result of her sinus issues, with increased sinus pressure often preceding their onset. She noted that her issues got noticeably worse since coming to [place], due to all of the environmental allergies & dust. Patient stated that she was previously taking Allegra & Flonase in the past. She reported feeling like her symptoms were much better controlled at that time on those medications. She could not remember why the Allegra was discontinued, stating that it was something to do with her "having taken it for so long." She is currently taking Zyrtec & Astelin spray. Regarding the Zyrtec, patient commented that she feels like it makes her drowsy, which she is uncomfortable with throughout the day. She also expressed dislike with the Astelin spray, as she says that it is very irritating/drying to her nasal passages.     With her headaches, she noted that they are fairly persistent. She stated that her current HA was a 6/10. She denied having many HA that were debilitating to the point that she is unable to get through her daily activities. She does not describe any prodromal-symptoms/aura, instead describing that the HA are often preceded by worsening sinus pressure. When  asked about the Imitrex, patient was unaware of this medication - she has not been using it. Discussed options for HA management, as it does not sound like classic migraine symptoms at present. Talked about trying to get better control of the sinus issues in hopes that it would help reduce/mitigate the frequency/intensity of her headaches, which she was in agreement with. Since she reported relief with the Allegra & Flonase independently, will plan to re-start these medications in place of the Zyrtec & Astelin spray. Discussed also using saline spray, netti pots, & nasal lavage, which patient reported that she already does. Encouraged her to use the nasal spray daily & increase her usage of the netti pots & nasal lavage. Discussed getting her established with ENT given previous surgery, to see if they would offer any additional recommendations.     Medical History:      PAST MEDICAL HISTORY:    Past Medical History:   Diagnosis Date    Chronic pain     Depression     GERD (gastroesophageal reflux disease)     Low back pain     Memory changes     Migraine     Neck problem     Osteoarthritis     Shortness of breath     Wears glasses        SURGICAL HISTORY:     Past Surgical History:   Procedure Laterality Date    ANTERIOR FUSION CERVICAL SPINE  12/30/2020    BREAST IMPLANT REMOVAL Bilateral  2022    CYSTOURETHROSCOPY      FOOT SURGERY Bilateral     HX BREAST AUGMENTATION Bilateral     x3, 1974, 1994, 2014    HX CATARACT REMOVAL Bilateral 01/12/2021    HX CERVICAL SPINE SURGERY      HX ROTATOR CUFF REPAIR Right     SACROILIAC JOINT INJECTION      TOE AMPUTATION         ALLERGIES:    She is allergic to latex, darvon [propoxyphene], poison ivy extract, and shellfish derived.     FAMILY HISTORY:    Her family history includes Arthritis-osteo in her sister; Food Allergy in her mother and sister; No Known Problems in her daughter, father, maternal grandfather, paternal grandfather, and paternal grandmother; Pneumonia in  her maternal grandmother and mother; Psoriasis in her daughter.    SOCIAL HISTORY:    She  reports that she does not currently use alcohol. She  reports that she has never smoked. She has never used smokeless tobacco. She  reports that she does not currently use drugs after having used the following drugs: Marijuana. Frequency: 5.00 times per week.     MEDICATIONS:    Outpatient Medications Marked as Taking for the 02/11/24 encounter (Office Visit) with Kathe Becton, MD   Medication Sig    fexofenadine (ALLEGRA) 60 mg Oral Tablet Take 3 Tablets (180 mg total) by mouth Once a day    fluticasone propionate (FLONASE) 50 mcg/actuation Nasal Spray, Suspension Administer 1 Spray into each nostril Once a day       Physical Exam:     Vitals:  height is 1.626 m (5\' 4" ) and weight is 54.5 kg (120 lb 2.4 oz). Her temperature is 36 C (96.8 F). Her blood pressure is 140/64 (abnormal) and her pulse is 84. Her oxygen saturation is 98%.      General:  Well-nourished, well-developed, in no apparent distress  Head:  Normocephalic, atraumatic  Eyes:  Pupils equally round and reactive to light, EOMI grossly intact, sclareae non-icteric, conjunctivae clear BIL; wears corrective lenses  Throat:  Oropharynx clear, mucous membranes moist  Cardiovascular:  Acyanotic; normal coloration  Respiratory:  Normal WOB on room air; equal inspiratory chest rise BIL  Extremities:  No peripheral edema  Skin:  Warm and dry; no rashes or lesions to exposed extremities  Neurological:  Awake, A&O x 3.  Psychiatric:  Normal mood & affect     Data Reviewed and Interpretation:     Labs/Imaging reviewed: Most recent labs & CT Sinuses    Notes reviewed: Most recent PCP notes    Assessment:     Rachel [name] is a 83 y.o. female with PMH of of  osteoarthritis, migraines, & chronic pain who presents to the clinic for follow-up of chronic allergies/sinusitis & associated headaches. Will discontinue Zyrtec & Astelin spray. Instead, will re-start Allegra &  Flonase daily. A referral was placed for ENT consultation.      Plan:     Chronic Congestion & Sinus Pressure w/ associated Headaches  CT Sinuses (08/15/22): frontal sinuses clear & frontal recesses patent; minimal mucosal thickening in the maxillary sinuses; No air-fluid levels; nasal septum deviated to the left w/ a large bony spur; no mastoid effusion         PLAN  Discontinue Zyrtec & Astelin spray  Start Allegra 180 mg daily  Start Flonase daily  Continue saline spray, neti pots, & nasal lavage  Discussed limiting use of NSAIDs to avoid medication-induced  HA  ENT Referral       Fluctuating Blurry Vision  Hgb A1C ordered  Referral for [employer]      Healthcare Maintenance:     Health Maintenance Due   Topic Date Due    RSV Adult 60+ or Pregnancy (1 - 1-dose 75+ series) Never done    Adult Tdap-Td (2 - Td or Tdap) 10/03/2021    Medicare Annual Wellness Visit - Calendar Year Insurers  12/25/2023       Follow Up:       Return to clinic as needed; recommend 1-2 month f/u w/ PCP (Dr. Allean Found)  Labs and Imaging Needing Follow Up:  Hgb A1C      Patient had no questions regarding treatment plan, goals, risks or benefits and agrees to contact me or my clinic in the interim should any questions or problems arise.    Kathe Becton, MD  Internal Medicine, PGY-1     I discussed the patient's care with the Resident prior to the patient leaving the clinic. Any significant discussion points are noted .    Beverly Milch, MD

## 2024-02-12 ENCOUNTER — Ambulatory Visit (HOSPITAL_BASED_OUTPATIENT_CLINIC_OR_DEPARTMENT_OTHER): Payer: Self-pay | Admitting: Medical

## 2024-02-12 ENCOUNTER — Encounter (HOSPITAL_BASED_OUTPATIENT_CLINIC_OR_DEPARTMENT_OTHER): Payer: Self-pay | Admitting: Medical

## 2024-02-12 ENCOUNTER — Ambulatory Visit: Payer: Medicare PPO | Attending: GENERAL SURGERY | Admitting: Medical

## 2024-02-12 ENCOUNTER — Ambulatory Visit (HOSPITAL_BASED_OUTPATIENT_CLINIC_OR_DEPARTMENT_OTHER)
Admission: RE | Admit: 2024-02-12 | Discharge: 2024-02-12 | Disposition: A | Discharge status: Home / Self Care | Payer: Medicare PPO | Source: Ambulatory Visit

## 2024-02-12 VITALS — BP 148/64 | HR 82 | Temp 97.7°F | Resp 16 | Ht 63.0 in | Wt 120.4 lb

## 2024-02-12 DIAGNOSIS — R928 Other abnormal and inconclusive findings on diagnostic imaging of breast: Secondary | ICD-10-CM | POA: Insufficient documentation

## 2024-02-12 DIAGNOSIS — Z1231 Encounter for screening mammogram for malignant neoplasm of breast: Secondary | ICD-10-CM | POA: Insufficient documentation

## 2024-02-12 DIAGNOSIS — Z0000 Encounter for general adult medical examination without abnormal findings: Secondary | ICD-10-CM

## 2024-02-12 DIAGNOSIS — Z9886 Personal history of breast implant removal: Secondary | ICD-10-CM | POA: Insufficient documentation

## 2024-02-12 DIAGNOSIS — G2581 Restless legs syndrome: Secondary | ICD-10-CM | POA: Insufficient documentation

## 2024-02-12 DIAGNOSIS — Z0001 Encounter for general adult medical examination with abnormal findings: Secondary | ICD-10-CM | POA: Insufficient documentation

## 2024-02-17 ENCOUNTER — Telehealth (HOSPITAL_BASED_OUTPATIENT_CLINIC_OR_DEPARTMENT_OTHER): Payer: Self-pay | Admitting: Student in an Organized Health Care Education/Training Program

## 2024-02-17 NOTE — Telephone Encounter (Signed)
 Received fax from [facility] regarding Duloxetine 60 mg tablets. Stating that the drug is not covered with the patient's plan. Fax listing alternatives to be reviewed by provider. Placing in [facility] [employer] mailbox for review and completion.   Berna Spare, RN

## 2024-02-19 NOTE — Telephone Encounter (Signed)
 Medical Group Practice  [address]  [place], [place] [address]       PATIENT NAME: Rachel [name]. Rachel [name]  MRN: [id number]  DOB: 04-05-1941        Date: 02/19/2024  Time: 11:40      Prior authorization for Duloxetine 60 mg capsules submitted to patient's insurance via CoverMyMeds.     Key: [id number]      Lynda Rainwater, BSN RN  Clinical Nurse Coordinator

## 2024-02-20 NOTE — Telephone Encounter (Signed)
 Duloxetine prior Berkley Harvey has been approved. Placing approval to be scanned. Ronnald Ramp, MA

## 2024-02-26 NOTE — Telephone Encounter (Signed)
 Medical Group Practice  [address]  [place], [place] [address]     PATIENT NAME: Rachel [name]. Thane   MRN: [id number]  DOB: [birth year]     3.5.2025  10:19    Received an approval for Duloxetine Hcl 60 mg capsules from [employer].     Auth / Requisition # Darcella Gasman - [id number] Berkley Harvey - [id number])    Will update the patient & scan approval for review.    Maudry Mayhew, BSN RN  Clinical Nurse Coordinator

## 2024-02-27 ENCOUNTER — Other Ambulatory Visit (INDEPENDENT_AMBULATORY_CARE_PROVIDER_SITE_OTHER): Payer: Self-pay | Admitting: INTERNAL MEDICINE

## 2024-03-02 ENCOUNTER — Ambulatory Visit (HOSPITAL_BASED_OUTPATIENT_CLINIC_OR_DEPARTMENT_OTHER): Payer: Self-pay | Admitting: Student in an Organized Health Care Education/Training Program

## 2024-03-02 ENCOUNTER — Other Ambulatory Visit (HOSPITAL_BASED_OUTPATIENT_CLINIC_OR_DEPARTMENT_OTHER): Payer: Self-pay | Admitting: Student in an Organized Health Care Education/Training Program

## 2024-03-02 DIAGNOSIS — F39 Unspecified mood [affective] disorder: Secondary | ICD-10-CM

## 2024-03-02 MED ORDER — DULOXETINE 60 MG CAPSULE,DELAYED RELEASE
60.0000 mg | DELAYED_RELEASE_CAPSULE | Freq: Every day | ORAL | 3 refills | Status: AC
Start: 2024-03-02 — End: ?

## 2024-03-02 NOTE — Telephone Encounter (Signed)
 Received fax requesting refill, Rx pended.  Laurell [place], RN

## 2024-03-02 NOTE — Telephone Encounter (Signed)
 Regarding: Clinical Question  ----- Message from Lum Babe sent at 03/02/2024 10:40 AM EDT -----  Copied From CRM #[id number].samantha with [employer] (Other) called with a clinical question.     Dr. Allean FoundLelon Mast with [employer] is requesting to speak with someone about the Rx for Oxycodone  and the dose of the medication. Please call to discuss.

## 2024-03-02 NOTE — Telephone Encounter (Signed)
 I tried to contact Lelon Mast with [employer] to ask what questions her team has about the oxycodone prescription. My call was not answered. Detailed voicemail with callback number left.  Maudry Mayhew, RN

## 2024-03-03 ENCOUNTER — Encounter (INDEPENDENT_AMBULATORY_CARE_PROVIDER_SITE_OTHER): Payer: Self-pay | Admitting: Otolaryngology

## 2024-03-03 ENCOUNTER — Other Ambulatory Visit: Payer: Self-pay

## 2024-03-03 ENCOUNTER — Ambulatory Visit: Payer: Self-pay | Attending: Otolaryngology | Admitting: Otolaryngology

## 2024-03-03 ENCOUNTER — Other Ambulatory Visit (INDEPENDENT_AMBULATORY_CARE_PROVIDER_SITE_OTHER): Payer: Self-pay | Admitting: Otolaryngology

## 2024-03-03 ENCOUNTER — Ambulatory Visit (INDEPENDENT_AMBULATORY_CARE_PROVIDER_SITE_OTHER): Admitting: Rheumatology

## 2024-03-03 VITALS — BP 137/80 | HR 101 | Temp 98.0°F | Ht 64.0 in | Wt 119.3 lb

## 2024-03-03 DIAGNOSIS — R739 Hyperglycemia, unspecified: Secondary | ICD-10-CM

## 2024-03-03 DIAGNOSIS — J329 Chronic sinusitis, unspecified: Secondary | ICD-10-CM | POA: Insufficient documentation

## 2024-03-03 DIAGNOSIS — Z9889 Other specified postprocedural states: Secondary | ICD-10-CM

## 2024-03-03 DIAGNOSIS — J309 Allergic rhinitis, unspecified: Secondary | ICD-10-CM

## 2024-03-03 LAB — HGA1C (HEMOGLOBIN A1C WITH EST AVG GLUCOSE)
ESTIMATED AVERAGE GLUCOSE: 108 mg/dL
HEMOGLOBIN A1C: 5.4 % (ref 4.0–5.6)

## 2024-03-03 MED ORDER — FLUTICASONE PROPIONATE 50 MCG/ACTUATION NASAL SPRAY,SUSPENSION
2.0000 | Freq: Every day | NASAL | 5 refills | Status: DC
Start: 2024-03-03 — End: 2024-03-03

## 2024-03-03 MED ORDER — LIDOCAINE 4% AND PHENYLEPHRINE 1% NASAL SOLUTION
5.0000 [drp] | Freq: Once | NASAL | Status: AC
Start: 2024-03-03 — End: 2024-03-03
  Administered 2024-03-03: 5 [drp] via NASAL

## 2024-03-03 MED ORDER — AMOXICILLIN 875 MG-POTASSIUM CLAVULANATE 125 MG TABLET
1.0000 | ORAL_TABLET | Freq: Two times a day (BID) | ORAL | 0 refills | Status: AC
Start: 2024-03-03 — End: 2024-03-17

## 2024-03-03 NOTE — Procedures (Signed)
 ENT, PHYSICIAN OFFICE CENTER  [address]  [place] [address]  Operated by [facility]  Procedure Note    Name: Rachel [name] MRN:  [id number]   Date: 03/03/2024 DOB:  [birth year] (82 y.o.)         [address]31 - NASAL ENDOSCOPY DIAGNOSTIC UNILATERAL OR BILATERAL (AMB ONLY)    Performed by: Clayborne Dana, DO  Authorized by: Arnold Long, MD    Time Out:     Immediately before the procedure, a time out was called:  Yes    Patient verified:  Yes    Procedure Verified:  Yes    Site Verified:  Yes    Procedure: rigid nasal endoscopy  Pre-operative Dx: sinusitis unspecified  Post-operative Dx: same    Description: bilateral side(s) of the nose anesthetized with Afrin/lidocaine.  A 30-degree rigid endoscope was passed into the nose with the following findings:  Septum: midline  Right  Inferior turbinate: normal  Middle turbinate: mild crusting  Polyps: 0  Edema: 0  Drainage: 0    LK score: 0  NPS: 0    Left:  Inferior turbinate: 0  Middle turbinate: mild crusting  Polyps: 0  Edema: 0  Drainage: 0    LK score: 0  NPS: 0    Nasopharynx: patent and no obstruction or mass lesions    The patient tolerated the procedure well.      Rachel "[place]" [place], DO     I agree with the description of this procedure.  I was present for the entire procedure. I was present for the entire viewing portion of the endoscopy from insertion to removal of scope.      Rachel Perine A. Candelaria Celeste, MD  Associate professor  Otolaryngology-Head and Neck Surgery  [facility]

## 2024-03-03 NOTE — Progress Notes (Signed)
 [facility]  OTOLARYNGOLOGY DEPARTMENT    HISTORY AND PHYSICAL    PATIENT NAME:  Rachel [name]  MRN:  [id number]  DOB:  11/06/1941  DATE OF SERVICE:  03/03/2024      Chief Complaint:    Chief Complaint   Patient presents with    Sinus Problem       HPI:  Rachel [name] is a 83 y.o. female with a history of sinus disease. Around 5 years ago she says she had "sinus surgery" in the office in [place]. She has not had any other face or sinus surgeries. Her symptoms consist of recurrent nasal congestion, yellow thick nasal drainage, and forehead pressure headaches in the morning - these symptoms have been occurring annually during the winter time for many years. She has previously been allergy tested >10 years ago and was formerly on injection immunotherapy for 2 years but discontinued after an accidental dosage issue resulting in an anaphylactic-like reaction. She is currently taking Azelastine and Allegra. She recently started Flonase.      Past Medical History:  Past Medical History:   Diagnosis Date    Chronic pain     Depression     GERD (gastroesophageal reflux disease)     Low back pain     Memory changes     Migraine     Neck problem     Osteoarthritis     Shortness of breath     Wears glasses            Past Surgical History:  Past Surgical History:   Procedure Laterality Date    ANTERIOR FUSION CERVICAL SPINE  12/30/2020    BREAST IMPLANT REMOVAL Bilateral     2022    CYSTOURETHROSCOPY      FOOT SURGERY Bilateral     HX BREAST AUGMENTATION Bilateral     x3, 1974, 1994, 2014    HX CATARACT REMOVAL Bilateral 01/12/2021    HX CERVICAL SPINE SURGERY      HX ROTATOR CUFF REPAIR Right     SACROILIAC JOINT INJECTION      TOE AMPUTATION             Medications:  Current Outpatient Medications   Medication Sig    amoxicillin-pot clavulanate (AUGMENTIN) 875-125 mg Oral Tablet Take 1 Tablet by mouth Every 12 hours for 14 days    dexlansoprazole (DEXILANT) 30 mg Oral Cap, Delayed Rel., Multiphasic Take  1 Capsule (30 mg total) by mouth Once a day Indications: gastroesophageal reflux disease, failed protonix, pepcid, nexium    DULoxetine (CYMBALTA DR) 60 mg Oral Capsule, Delayed Release(E.C.) Take 1 Capsule (60 mg total) by mouth Once a day    estradioL (ESTRACE) 0.5 mg Oral Tablet Take 1 Tablet (0.5 mg total) by mouth Once a day    fexofenadine (ALLEGRA) 60 mg Oral Tablet Take 3 Tablets (180 mg total) by mouth Once a day    fluticasone propionate (FLONASE) 50 mcg/actuation Nasal Spray, Suspension Administer 1 Spray into each nostril Once a day    fluticasone propionate (FLONASE) 50 mcg/actuation Nasal Spray, Suspension Administer 2 Sprays into each nostril Once a day    medroxyPROGESTERone (PROVERA) 2.5 mg Oral Tablet Take 1 Tablet (2.5 mg total) by mouth Once a day    ondansetron (ZOFRAN ODT) 4 mg Oral Tablet, Rapid Dissolve Take 1 Tablet (4 mg total) by mouth Every 8 hours as needed for Nausea/Vomiting    Oxycodone (ROXICODONE) 10 mg Oral Tablet Take 1  Tablet (10 mg total) by mouth Every 6 hours as needed for Pain for up to 30 days Indications: pain, osteoarthritis    solifenacin (VESICARE) 10 mg Oral Tablet TAKE 1 TABLET(10 MG) BY MOUTH DAILY    sumatriptan succinate (IMITREX) 100 mg Oral Tablet Take 1 Tablet (100 mg total) by mouth Once, as needed for Migraine for up to 12 doses May repeat in 2 hours in needed       Allergies:  Allergies   Allergen Reactions    Latex     Darvon [Propoxyphene] Mental Status Effect     Hallucinations    Poison Ivy Extract     Shellfish Derived        Family History:  Family Medical History:       Problem Relation (Age of Onset)    Arthritis-osteo Sister    Food Allergy Mother, Sister    No Known Problems Father, Maternal Grandfather, Paternal Grandmother, Paternal Grandfather, Daughter    Pneumonia Mother, Maternal Grandmother    Psoriasis Daughter              Social History:  Social History     Tobacco Use   Smoking Status Never   Smokeless Tobacco Never     Social History      Substance and Sexual Activity   Alcohol Use Not Currently         Review of Systems:  All other systems were reviewed and found to be negative except the ones mentioned in the HPI    PHYSICAL EXAM    Vitals:    03/03/24 1006   BP: 137/80   Pulse: (!) 101   Temp: 36.7 C (98 F)   Weight: 54.1 kg (119 lb 4.3 oz)   Height: 1.626 m (5\' 4" )   BMI: 20.47                    General Appearance: Pleasant, cooperative, healthy, and in no acute distress.  Eyes: Conjunctivae/corneas clear, EOM's intact. Subjective double vision in all directions.  Head and Face: Normocephalic, atraumatic.  Face symmetric, no obvious lesions.   Pinnae: Normal shape and position.   External auditory canals:  Patent without inflammation.  Tympanic membranes:  Intact, translucent, midposition, middle ear aerated.  Nose:  External pyramid midline. Septum midline. Mucosa normal. No purulence, polyps, or crusts. See scope note.  Oral Cavity/Oropharynx: Crowns over most molars. No mucosal lesions, masses, or pharyngeal asymmetry.  Tonsils: Absent.  Hypopharynx/Larynx: Some mild hoarseness  Neck:  No palpable thyroid, salivary gland, or neck masses.  Heme/Lymph:  No cervical adenopathy.  Cardiovascular:  Good perfusion of upper extremities.  No cyanosis of the hands or fingers.  Lungs: No apparent stridorous breathing. No acute distress.  Skin: Skin warm and dry.  Neurologic: Cranial nerves:  grossly intact.  Psychiatric:  Alert and oriented x 3.      ENT, PHYSICIAN OFFICE CENTER  [address]  [place] [address]  Operated by [facility]  Procedure Note    Name: Rachel [name] MRN:  [id number]   Date: 03/03/2024 DOB:  [birth year] (82 y.o.)         31231 - NASAL ENDOSCOPY DIAGNOSTIC UNILATERAL OR BILATERAL (AMB ONLY)    Performed by: Clayborne Dana, DO  Authorized by: Arnold Long, MD    Time Out:     Immediately before the procedure, a time out was called:  Yes    Patient verified:  Yes  Procedure Verified:  Yes    Site Verified:   Yes    Procedure: rigid nasal endoscopy  Pre-operative Dx: sinusitis unspecified  Post-operative Dx: same    Description: bilateral side(s) of the nose anesthetized with Afrin/lidocaine.  A 30-degree rigid endoscope was passed into the nose with the following findings:  Septum: midline  Right  Inferior turbinate: normal  Middle turbinate: mild crusting  Polyps: 0  Edema: 0  Drainage: 0    LK score: 0  NPS: 0    Left:  Inferior turbinate: 0  Middle turbinate: mild crusting  Polyps: 0  Edema: 0  Drainage: 0    LK score: 0  NPS: 0    Nasopharynx: patent and no obstruction or mass lesions    The patient tolerated the procedure well.      Paul "[place]" [place], DO     I agree with the description of this procedure.  I was present for the entire procedure. I was present for the entire viewing portion of the endoscopy from insertion to removal of scope.      Tyaira Heward A. Candelaria Celeste, MD  Associate professor  Otolaryngology-Head and Neck Surgery  [facility]           ASSESSMENT AND PLAN:   83 y.o. female with chronic sinusitis and allergic rhinitis with a history of previous sinus surgery ([place], ~5 years ago). On nasal endoscopy her bilateral maxillary os is open indicative of previous surgery. There is some mild mucosal crusting but otherwise clear exam. Given her previous history and persistent sinus symptoms, I recommend additional workup.    Assessment/Plan   1. Sinusitis, unspecified chronicity, unspecified location    2. Allergic rhinitis    3. Chronic rhinosinusitis    4. S/P FESS (functional endoscopic sinus surgery)      Orders Placed This Encounter    63875 - NASAL ENDOSCOPY DIAGNOSTIC UNILATERAL OR BILATERAL (AMB ONLY)    CT LAB SINUSES WO IV CONTRAST    RAST, COMMON PIGWEED (W14)    RAST, HOUSE DUST MITE (D1) (DERMATOPHAGOIDES PTERONYSSINUS)    RAST, [place] GRASS (G2)    RAST, ASPERGILLUS FUMIGATUS (M3)    RAST, PENICILLIUM CHRYSOGENUM (M1)    RAST, HELMINTHOSPORIUM HALODES (M8)    RAST, COCKROACH, GERMAN  (I6)    RAST, COMMON SILVER BIRCH (T3)    RAST, GOOSE FOOT (LAMB'S QUARTER) (W10)    RAST, MAPLE LEAF SYCAMORE (T11)    RAST, HOUSE DUST MITE (D1) (DERMATOPHAGOIDES PTERONYSSINUS)    RAST, BOX-ELDER (T1)    RAST, CAT DANDER (E1)    RAST, DOG DANDER (E5)    RAST, HOUSE DUST MITE (D2) (DERMATOPHAGOIDES FARINAE)    RAST, ALTERNARIA ALTERNATA (M6)    RAST, COMMON RAGWEED (W1)    RAST, PLANTAIN, ENGLISH (W9)    RAST, ELM (T8)    RAST, TIMOTHY GRASS (G6)    RAST, JOHNSON GRASS (G10)    RAST, CLADOSPORIUM HERBARUM (M2)    RAST, OAK (T7)    lidocaine 4 % + phenylephrine 1 % (1:1) nasal solution    fluticasone propionate (FLONASE) 50 mcg/actuation Nasal Spray, Suspension    amoxicillin-pot clavulanate (AUGMENTIN) 875-125 mg Oral Tablet       Continue Azelastine BID and Allegra  Continue Flonase 2 sprays daily  Serum allergy testing  Augmentin x14 days (instructed to complete approximately 3 weeks prior to sinus imaging)  Follow up in 4-6 weeks with same day CT sinus      Anticoagulation plan: N/A  Paul "Clyde" Aledo, DO     See resident's note for details. I saw and examined the patient and agree with the resident's findings and plan as written except as noted  and I was present and supervised/observed the entire procedure.    Dianne Bady A. Candelaria Celeste, MD  Associate professor  Otolaryngology-Head and Neck Surgery  [facility]        CC:    PCP Reymundo Poll, MD  [address]  [place] [address]   Referring Provider Kirkland Hun, PA-C  [address]  [address]  [place],  [place] [address]

## 2024-03-03 NOTE — Telephone Encounter (Signed)
 Requesting 90 day supply.

## 2024-03-04 ENCOUNTER — Ambulatory Visit (HOSPITAL_BASED_OUTPATIENT_CLINIC_OR_DEPARTMENT_OTHER): Payer: Self-pay | Admitting: Student in an Organized Health Care Education/Training Program

## 2024-03-04 LAB — RAST, BERMUDA GRASS (G2): BERMUDA GRASS (G2): 0.5 kU/L — ABNORMAL HIGH (ref <0.10)

## 2024-03-04 LAB — RAST, COMMON PIGWEED (W14): COMMON PIGWEED (W14): 0.33 kU/L — ABNORMAL HIGH (ref <0.10)

## 2024-03-04 LAB — RAST, HELMINTHOSPORIUM HALODES (M8)
HELMINTHOSPORIUM HALODES (M8) INTERPRETATION: UNDETECTABLE
HELMINTHOSPORIUM HALODES (M8): <0.1 kU/L (ref <0.10)

## 2024-03-04 LAB — RAST, COCKROACH, GERMAN (I6): COCKROACH, GERMAN (I6): 0.14 kU/L — ABNORMAL HIGH (ref <0.10)

## 2024-03-04 LAB — RAST, MAPLE LEAF SYCAMORE (T11): MAPLE LEAF SYCAMORE (T11): 0.37 kU/L — ABNORMAL HIGH (ref <0.10)

## 2024-03-04 LAB — RAST, BOX-ELDER (T1): BOX-ELDER (T1): 0.22 kU/L — ABNORMAL HIGH (ref <0.10)

## 2024-03-04 LAB — RAST, TIMOTHY GRASS (G6): TIMOTHY GRASS (G6): 1.17 kU/L — ABNORMAL HIGH (ref <0.10)

## 2024-03-04 LAB — RAST, DOG DANDER (E5): DOG DANDER (E5): 0.19 kU/L — ABNORMAL HIGH (ref <0.10)

## 2024-03-04 LAB — RAST, OAK (T7): OAK (T7): 1.12 kU/L — ABNORMAL HIGH (ref <0.10)

## 2024-03-04 LAB — RAST, CAT DANDER (E1)
CAT DANDER (E1) INTERPRETATION: UNDETECTABLE
CAT DANDER (E1): <0.1 kU/L (ref <0.10)

## 2024-03-04 LAB — RAST, ELM (T8): ELM (T8): 0.18 kU/L — ABNORMAL HIGH (ref <0.10)

## 2024-03-04 LAB — RAST, ALTERNARIA ALTERNATA (M6)
ALTERNARIA TENUIS (M6) INTERPRETATION: UNDETECTABLE
ALTERNARIA TENUIS (M6): <0.1 kU/L (ref <0.10)

## 2024-03-04 LAB — RAST, HOUSE DUST MITE (D1) (DERMATOPHAGOIDES PTERONYSSINUS): HOUSE DUST MITE (D1) (DERMATOPHAGOIDES PTERONYSSINUS): 6.19 kU/L — ABNORMAL HIGH (ref <0.10)

## 2024-03-04 LAB — RAST, COMMON SILVER BIRCH (T3): COMMON SILVER BIRCH (T3): 0.29 kU/L — ABNORMAL HIGH (ref <0.10)

## 2024-03-04 LAB — RAST, CLADOSPORIUM HERBARUM (M2)
CLADOSPORIUM HERBARUM (M2) INTERPRETATION: UNDETECTABLE
CLADOSPORIUM HERBARUM (M2): <0.1 kU/L (ref <0.10)

## 2024-03-04 LAB — RAST, JOHNSON GRASS (G10): JOHNSON GRASS (G10): 0.47 kU/L — ABNORMAL HIGH (ref <0.10)

## 2024-03-04 LAB — RAST, PLANTAIN, ENGLISH (W9): PLANTAIN, ENGLISH (W9): 0.27 kU/L — ABNORMAL HIGH (ref <0.10)

## 2024-03-04 LAB — RAST, PENICILLIUM CHRYSOGENUM (M1)
PENICILLIUM CHRYSOGENUM (M1) INTERPRETATION: UNDETECTABLE
PENICILLIUM CHRYSOGENUM (M1): <0.1 kU/L (ref <0.10)

## 2024-03-04 LAB — RAST, HOUSE DUST MITE (D2) (DERMATOPHAGOIDES FARINAE): HOUSE DUST MITE (D2) (DERMATOPHAGOIDES FARINAE): 12.7 kU/L — ABNORMAL HIGH (ref <0.10)

## 2024-03-04 LAB — RAST, ASPERGILLUS FUMIGATUS (M3)
ASPERGILLUS FUMIGATUS (M3) INTERPRETATION: UNDETECTABLE
ASPERGILLUS FUMIGATUS (M3): <0.1 kU/L (ref <0.10)

## 2024-03-04 LAB — RAST, COMMON RAGWEED (W1): COMMON RAGWEED (W1): 0.27 kU/L — ABNORMAL HIGH (ref <0.10)

## 2024-03-04 LAB — RAST, GOOSE FOOT (LAMB'S QUARTER) (W10): GOOSE FOOT (LAMB'S QUARTER) (W10): 0.28 kU/L — ABNORMAL HIGH (ref <0.10)

## 2024-03-04 MED ORDER — FEXOFENADINE 60 MG TABLET
180.0000 mg | ORAL_TABLET | Freq: Every day | ORAL | 3 refills | Status: DC
Start: 2024-03-04 — End: 2024-03-26

## 2024-03-04 MED ORDER — FLUTICASONE PROPIONATE 50 MCG/ACTUATION NASAL SPRAY,SUSPENSION: 1.0000 | Freq: Every day | NASAL | 3 refills | Status: DC

## 2024-03-05 ENCOUNTER — Other Ambulatory Visit: Payer: Self-pay

## 2024-03-05 ENCOUNTER — Ambulatory Visit
Payer: Self-pay | Attending: Student in an Organized Health Care Education/Training Program | Admitting: Student in an Organized Health Care Education/Training Program

## 2024-03-05 ENCOUNTER — Encounter (HOSPITAL_BASED_OUTPATIENT_CLINIC_OR_DEPARTMENT_OTHER): Payer: Self-pay | Admitting: Student in an Organized Health Care Education/Training Program

## 2024-03-05 ENCOUNTER — Telehealth (HOSPITAL_BASED_OUTPATIENT_CLINIC_OR_DEPARTMENT_OTHER): Payer: Self-pay | Admitting: Student in an Organized Health Care Education/Training Program

## 2024-03-05 VITALS — BP 108/60 | HR 96 | Temp 97.4°F | Ht 63.66 in | Wt 121.3 lb

## 2024-03-05 DIAGNOSIS — H538 Other visual disturbances: Secondary | ICD-10-CM | POA: Insufficient documentation

## 2024-03-05 DIAGNOSIS — R1310 Dysphagia, unspecified: Secondary | ICD-10-CM | POA: Insufficient documentation

## 2024-03-05 DIAGNOSIS — J329 Chronic sinusitis, unspecified: Secondary | ICD-10-CM | POA: Insufficient documentation

## 2024-03-05 DIAGNOSIS — K219 Gastro-esophageal reflux disease without esophagitis: Secondary | ICD-10-CM | POA: Insufficient documentation

## 2024-03-05 DIAGNOSIS — R110 Nausea: Secondary | ICD-10-CM | POA: Insufficient documentation

## 2024-03-05 MED ORDER — DEXLANSOPRAZOLE 60 MG CAPSULE,BIPHASE DELAYED RELEASE
60.0000 mg | DELAYED_RELEASE_CAPSULE | Freq: Every day | ORAL | 1 refills | Status: DC
Start: 2024-03-05 — End: 2024-03-17

## 2024-03-05 NOTE — Nursing Note (Signed)
 Patient is in clinic and relayed her daughter is taking a medication for GERD, and wanted Dr. Allean Found to know that her daughter's medications are: Pantoprazole (20 mg) twice a day; Famotidine (40 mg) at bedtime; Ondansetron ODT; and OTC Gaviscom liquid medication. Informed patient that I would inform Dr. Allean Found of the above.     Laurell , RN

## 2024-03-05 NOTE — Progress Notes (Signed)
 INTERNAL MEDICINE, [facility] [address]  [place] [address]  Operated by [facility]  Progress Note    Name: Rachel [name] MRN:  [id number]   Date: 03/05/2024 DOB:  [birth year] (83 y.o.)           Chief Complaint: Acid Reflux    Subjective:     Rachel [name] is a pleasant 83 year old female with PMH of interstitial cystitis, headache, breast implants s/p removal with subsequent chest pain, osteoarthritis, presenting for follow up.    Her main complaint is that she has acid reflux and difficulty swallowing food and drink. Last night she took ondansetron and it helped, as she was experiencing nausea. Also, she has a raspy voice. This has been going on for years, that's why they finally investigated it. She did a barium swallow in [place] around 4 years ago. She said the food was still in her esophagus 4 hours later, and doesn't remember what the recommendations were after the test. She started limiting her food sizes/intake and keeping her head up after eating. If she bends over, the food comes back up. They also told her she had a small hiatal hernia.     She took Dexilant for years but here they did not approve it. Here she has taken omeprazole. She also takes Pepcid. She does not feel relief when she takes Pepcid. Dexilant is the only thing that worked for her.    She also has a sinus infection. She saw ENT recently. Dr. Candelaria Celeste prescribed Augmentin x14 days and ordered allergy testing, then she will repeat CT sinus in 4-6 weeks.     Regarding her chronic pain: The only thing she has found to help give her relief for her arthritis is CBD cream and the oxycodone. She can also take Tylenol.    She also recently saw ophthalmology for her blurry vision. She has seen 3 ophthalmologists here. She has an appointment with [facility] coming up on the 17th to check her eyes again.     Objective :  BP 108/60   Pulse 96   Temp 36.3 C (97.4 F) (Temporal)   Ht 1.617 m (5'  3.66")   Wt 55 kg (121 lb 4.1 oz)   SpO2 96%   BMI 21.04 kg/m       Gen; NAD  HEENT: no icterus  Regular rate and rhythm  Lungs CTAB  Abd: symmetric nondistended  Ext: no c/e/e    Data reviewed:    Current Outpatient Medications   Medication Sig    amoxicillin-pot clavulanate (AUGMENTIN) 875-125 mg Oral Tablet Take 1 Tablet by mouth Every 12 hours for 14 days    Dexlansoprazole (DEXILANT) 60 mg Oral Cap, Delayed Rel., Multiphasic Take 1 Capsule (60 mg total) by mouth Once a day for 180 days Indications: gastroesophageal reflux disease, heartburn    DULoxetine (CYMBALTA DR) 60 mg Oral Capsule, Delayed Release(E.C.) Take 1 Capsule (60 mg total) by mouth Once a day    estradioL (ESTRACE) 0.5 mg Oral Tablet Take 1 Tablet (0.5 mg total) by mouth Once a day    fexofenadine (ALLEGRA) 60 mg Oral Tablet Take 3 Tablets (180 mg total) by mouth Once a day    fluticasone propionate (FLONASE) 50 mcg/actuation Nasal Spray, Suspension Administer 1 Spray into each nostril Once a day    fluticasone propionate (FLONASE) 50 mcg/actuation Nasal Spray, Suspension SHAKE LIQUID AND USE 2 SPRAYS IN EACH NOSTRIL DAILY    medroxyPROGESTERone (  PROVERA) 2.5 mg Oral Tablet Take 1 Tablet (2.5 mg total) by mouth Once a day    ondansetron (ZOFRAN ODT) 4 mg Oral Tablet, Rapid Dissolve Take 1 Tablet (4 mg total) by mouth Every 8 hours as needed for Nausea/Vomiting    Oxycodone (ROXICODONE) 10 mg Oral Tablet Take 1 Tablet (10 mg total) by mouth Every 6 hours as needed for Pain for up to 30 days Indications: pain, osteoarthritis    solifenacin (VESICARE) 10 mg Oral Tablet TAKE 1 TABLET(10 MG) BY MOUTH DAILY    sumatriptan succinate (IMITREX) 100 mg Oral Tablet Take 1 Tablet (100 mg total) by mouth Once, as needed for Migraine for up to 12 doses May repeat in 2 hours in needed     Assessment/Plan    Problem List Items Addressed This Visit          Digestive    Dysphagia   Will plan to obtain records for barium swallow results from [facility] next  visit     Other Visit Diagnoses         Gastroesophageal reflux disease, unspecified whether esophagitis present    -  Primary  Failed Pepcid and omeprazole doesn't work as well as [employer] did.  Dexilant was not approved when it was last prescribed.  Will send Dexilant in once more  Counseled re: lifestyle modifications for GERD    Relevant Medications    Dexlansoprazole (DEXILANT) 60 mg Oral Cap, Delayed Rel., Multiphasic      Nausea      See above  Continue ondansetron PRN for nausea    Relevant Medications    Dexlansoprazole (DEXILANT) 60 mg Oral Cap, Delayed Rel., Multiphasic      Sinusitis, unspecified chronicity, unspecified location      Saw ENT for chronic sinusitis  Has allergy tests ordered  Received Augmentin for 14 days, patient reports improvement in headaches  Will have CT sinus repeated in 4-6 weeks then return to ENT for further management      Blurry vision      Saw 3 eye doctors here in [place], has an appointment with [facility] coming up but she would like another opinion at [facility]  Referral placed    Relevant Orders    Refer to [facility]     Return in 1 year for annual health exam.    Reymundo Poll, MD

## 2024-03-05 NOTE — Telephone Encounter (Signed)
 Received fax from [facility] stating patient insurance does not cover dexlansoprazole 60 mg. Preferred alternatives are Pantoprazole, Omeprazole, or Rabeprazole sodium. According to patient chart,she has not tried any of the preferred medications. She has tried esomeprazole. Please advise. Ronnald Ramp, MA

## 2024-03-10 NOTE — Telephone Encounter (Signed)
 I received a call back from [facility] with [employer]. Their team works closely with [employer] to monitor for the risk of overdose related to opioid use. The risk increasing once a patient is over the 50 mme range & Ms. Aicher is currently at 39 mme. [facility] wants to be sure the provider is aware & asking if this is clinically appropriate? If provider would want to wean the patient down on the amount of pain medication, [facility] has resources available to help. Their team also noted a few providers prescribing pain medication since July. [facility] is sure the providers review the PDMP before prescribing opioids but wanted this to be on your radar, too. A fax is going to be sent with all of this information for you to review. Confirmed the fax number for [facility].  Maudry Mayhew, RN

## 2024-03-10 NOTE — Telephone Encounter (Signed)
 Denied  Prior Authorization Portal   Note from payer: Medical Denial Medical Denial  Payer: Joanette Gula [facility] [place] Case ID: [id number]    [phone]  Electronic appeal: Not supported    Ronnald Ramp, MA

## 2024-03-12 ENCOUNTER — Encounter (HOSPITAL_BASED_OUTPATIENT_CLINIC_OR_DEPARTMENT_OTHER): Payer: Self-pay | Admitting: Student in an Organized Health Care Education/Training Program

## 2024-03-17 ENCOUNTER — Other Ambulatory Visit (HOSPITAL_BASED_OUTPATIENT_CLINIC_OR_DEPARTMENT_OTHER): Payer: Self-pay | Admitting: Student in an Organized Health Care Education/Training Program

## 2024-03-17 DIAGNOSIS — M1990 Unspecified osteoarthritis, unspecified site: Secondary | ICD-10-CM

## 2024-03-17 DIAGNOSIS — K219 Gastro-esophageal reflux disease without esophagitis: Secondary | ICD-10-CM

## 2024-03-17 DIAGNOSIS — R110 Nausea: Secondary | ICD-10-CM

## 2024-03-17 NOTE — Telephone Encounter (Signed)
 Patient presented to clinic to retreive Rx, informed patient this was not printed yet (and that provider was not in office today) and that we could call her when this is completed. Patient verbalized an understanding. Patient also requested her Oxycodone  be sent to her pharmacy for a refill. Rx pended in same encounter, but marked to be sent electronically.     Edmundo Gourd, RN'

## 2024-03-17 NOTE — Telephone Encounter (Signed)
 Regarding: Clinical Question  ----- Message from [facility] sent at 03/17/2024 12:50 PM EDT -----  Copied From CRM [id number], JANA    Lacroix, Emberlynn A () called with a clinical question. Pt is wanting to pay for the following rx herself through goodrx. She is asking for the rx to be printed and she will pick it up. She would like a 90 day rx. She is also asking if there is a generic rx available. Please let pt know when this is put out for her to pick up. Thank you!    Dexlansoprazole  (DEXILANT ) 60 mg Oral Cap, Delayed Rel., Multiphasic

## 2024-03-18 ENCOUNTER — Other Ambulatory Visit (HOSPITAL_BASED_OUTPATIENT_CLINIC_OR_DEPARTMENT_OTHER): Payer: Self-pay | Admitting: Student in an Organized Health Care Education/Training Program

## 2024-03-18 DIAGNOSIS — M1990 Unspecified osteoarthritis, unspecified site: Secondary | ICD-10-CM

## 2024-03-18 MED ORDER — OXYCODONE 10 MG TABLET
10.0000 mg | ORAL_TABLET | Freq: Four times a day (QID) | ORAL | 0 refills | Status: DC | PRN
Start: 2024-03-18 — End: 2024-05-04

## 2024-03-18 MED ORDER — DEXLANSOPRAZOLE 60 MG CAPSULE,BIPHASE DELAYED RELEASE
60.0000 mg | DELAYED_RELEASE_CAPSULE | Freq: Every day | ORAL | 3 refills | Status: DC
Start: 2024-03-18 — End: 2024-06-09

## 2024-03-18 NOTE — Telephone Encounter (Signed)
 Called patient at provided number in regards to Rx being placed at front desk to be picked up. Received no answer, left voicemail asking for a call back at [phone].     Edmundo Gourd, RN

## 2024-03-26 ENCOUNTER — Encounter (HOSPITAL_BASED_OUTPATIENT_CLINIC_OR_DEPARTMENT_OTHER): Payer: Self-pay | Admitting: Student in an Organized Health Care Education/Training Program

## 2024-03-26 ENCOUNTER — Other Ambulatory Visit: Payer: Self-pay

## 2024-03-26 ENCOUNTER — Ambulatory Visit
Payer: Self-pay | Attending: Student in an Organized Health Care Education/Training Program | Admitting: Student in an Organized Health Care Education/Training Program

## 2024-03-26 VITALS — BP 115/70 | HR 88 | Temp 96.4°F | Ht 63.0 in | Wt 122.4 lb

## 2024-03-26 DIAGNOSIS — J309 Allergic rhinitis, unspecified: Secondary | ICD-10-CM | POA: Insufficient documentation

## 2024-03-26 DIAGNOSIS — M47892 Other spondylosis, cervical region: Secondary | ICD-10-CM | POA: Insufficient documentation

## 2024-03-26 DIAGNOSIS — K219 Gastro-esophageal reflux disease without esophagitis: Secondary | ICD-10-CM | POA: Insufficient documentation

## 2024-03-26 DIAGNOSIS — F39 Unspecified mood [affective] disorder: Secondary | ICD-10-CM | POA: Insufficient documentation

## 2024-03-26 DIAGNOSIS — N898 Other specified noninflammatory disorders of vagina: Secondary | ICD-10-CM | POA: Insufficient documentation

## 2024-03-26 DIAGNOSIS — T7840XA Allergy, unspecified, initial encounter: Secondary | ICD-10-CM | POA: Insufficient documentation

## 2024-03-26 MED ORDER — AZELASTINE 137 MCG (0.1 %) NASAL SPRAY
1.0000 | Freq: Two times a day (BID) | NASAL | 5 refills | Status: DC
Start: 2024-03-26 — End: 2024-07-17

## 2024-03-26 MED ORDER — FLUCONAZOLE 150 MG TABLET
150.0000 mg | ORAL_TABLET | Freq: Once | ORAL | 0 refills | Status: AC
Start: 2024-03-26 — End: 2024-03-26

## 2024-03-26 MED ORDER — FEXOFENADINE 180 MG TABLET
180.0000 mg | ORAL_TABLET | Freq: Every day | ORAL | 1 refills | Status: AC
Start: 2024-03-26 — End: 2024-09-22
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
## 2024-03-26 NOTE — Patient Instructions (Addendum)
 Buy some vaginal moisturizers: Luvena brand or Replens Brand    Limit the use of bidet/ excessive washing     Take Diflucan  pill once to help vaginal yeast infection. Sent script to pharmacy.

## 2024-03-26 NOTE — Nursing Note (Signed)
 03/26/24 1027   Fall Risk Assessment   Do you feel unsteady when standing or walking? Yes   Do you worry about falling? Yes   Have you fallen in the past year? No

## 2024-03-26 NOTE — Progress Notes (Signed)
 INTERNAL MEDICINE, [facility] [address]  [place] [address]  Operated by [facility]  Progress Note    Name: Rachel [name] MRN:  [id number]   Date: 03/26/2024 DOB:  Nov 21, 1941 (83 y.o.)           Chief Complaint: Fatigue (Figure out meds, vaginal discharge)    Subjective:     Rachel [name] is a pleasant 83 year old female with PMH of interstitial cystitis, headache, breast implants s/p removal with subsequent chest pain, osteoarthritis, presenting for follow up.    She just got the Dexilant  4 days ago and it has already made a big difference, she doesn't have any more swallowing difficulty, and the acid reflux has improved.     She also has a sinus infection. She saw ENT recently. Dr. Alla Isaacs prescribed Augmentin  x14 days and ordered allergy testing, then she will repeat CT sinus in 4-6 weeks.     She has more vaginal itching, she washes herself multiple times a day. Feels like a yeast infection.     Regarding her chronic pain: The only thing she has found to help give her relief for her arthritis is CBD cream and the oxycodone . She can also take Tylenol .    She also recently saw ophthalmology for her blurry vision. She has seen 3 ophthalmologists here. She has an appointment with [facility] coming up on the 17th to check her eyes again.     Objective :  BP 115/70   Pulse 88   Temp (!) 35.8 C (96.4 F)   Ht 1.6 m (5\' 3" )   Wt 55.5 kg (122 lb 5.7 oz)   SpO2 97%   BMI 21.67 kg/m       Gen; NAD  HEENT: no icterus  Abd: symmetric nondistended  Ext: no c/e/e    Data reviewed:    Current Outpatient Medications   Medication Sig    Dexlansoprazole  (DEXILANT ) 60 mg Oral Cap, Delayed Rel., Multiphasic Take 1 Capsule (60 mg total) by mouth Daily Indications: gastroesophageal reflux disease, heartburn    DULoxetine  (CYMBALTA  DR) 60 mg Oral Capsule, Delayed Release(E.C.) Take 1 Capsule (60 mg total) by mouth Once a day    estradioL  (ESTRACE ) 0.5 mg Oral Tablet Take 1 Tablet (0.5 mg  total) by mouth Daily    fexofenadine  (ALLEGRA ) 60 mg Oral Tablet Take 3 Tablets (180 mg total) by mouth Once a day    fluticasone  propionate (FLONASE ) 50 mcg/actuation Nasal Spray, Suspension Administer 1 Spray into each nostril Once a day    fluticasone  propionate (FLONASE ) 50 mcg/actuation Nasal Spray, Suspension SHAKE LIQUID AND USE 2 SPRAYS IN EACH NOSTRIL DAILY    medroxyPROGESTERone (PROVERA) 2.5 mg Oral Tablet Take 1 Tablet (2.5 mg total) by mouth Daily    ondansetron  (ZOFRAN  ODT) 4 mg Oral Tablet, Rapid Dissolve Take 1 Tablet (4 mg total) by mouth Every 8 hours as needed for Nausea/Vomiting    Oxycodone  (ROXICODONE ) 10 mg Oral Tablet Take 1 Tablet (10 mg total) by mouth Every 6 hours as needed for Pain for up to 30 days Indications: pain, osteoarthritis    solifenacin  (VESICARE ) 10 mg Oral Tablet TAKE 1 TABLET(10 MG) BY MOUTH DAILY    sumatriptan  succinate (IMITREX ) 100 mg Oral Tablet Take 1 Tablet (100 mg total) by mouth Once, as needed for Migraine for up to 12 doses May repeat in 2 hours in needed     Assessment/Plan    Problem List Items Addressed This  Visit          Digestive    Dysphagia   Will plan to obtain records for barium swallow results from [facility] next visit     Other Visit Diagnoses         Gastroesophageal reflux disease, unspecified whether esophagitis present    -  Primary  Failed Pepcid and omeprazole doesn't work as well as Dexilant  did.  Dexilant  just restarted recently   Counseled re: lifestyle modifications for GERD      Nausea      See above  Continue ondansetron  PRN for nausea    Fatigue    Discharge/ordor      Sinusitis, unspecified chronicity, unspecified location      Saw ENT for chronic sinusitis  Has allergy tests ordered  Received Augmentin  for 14 days, patient reports improvement in headaches  Will have CT sinus repeated in 4-6 weeks then return to ENT for further management (scheduled on 4/15)  Continue Flonase  daily and Astelin  daily which is helping with her allergies  and her headaches, refilled Astelin   Refilled Allegra  180 mg daily    HRT:  Post menopausal  She continues on Estrace  and Provera daily  She used to be on premarin      Severe arthritis  Continues on Oxycodone , pain contract    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    Lesle Ras, MD

## 2024-03-27 LAB — URINALYSIS, MACROSCOPIC
BILIRUBIN: NEGATIVE mg/dL
BLOOD: NEGATIVE mg/dL
COLOR: NORMAL
GLUCOSE: NEGATIVE mg/dL
KETONES: NEGATIVE mg/dL
NITRITE: NEGATIVE
PH: 6 (ref 5.0–8.0)
PROTEIN: NEGATIVE mg/dL
SPECIFIC GRAVITY: 1.029 (ref 1.005–1.030)
UROBILINOGEN: 2 mg/dL — AB

## 2024-03-27 LAB — URINALYSIS, MICROSCOPIC
HYALINE CASTS: 15 /LPF — ABNORMAL HIGH (ref <4.0)
RBCS: 1 /HPF (ref <6.0)
WBCS: 3 /HPF (ref <11.0)

## 2024-03-28 LAB — URINE CULTURE: URINE CULTURE: NO GROWTH

## 2024-03-30 LAB — VAGINITIS PATHOGENS: BACTERIAL, CANDIDA, AND TV PANEL, TMA
BACTERIAL VAGINOSIS: POSITIVE — AB
CANDIDA GLABRATA: NEGATIVE
CANDIDA SPECIES: POSITIVE — AB
TRICHOMONAS VAGINALIS: NEGATIVE

## 2024-04-01 ENCOUNTER — Ambulatory Visit (HOSPITAL_BASED_OUTPATIENT_CLINIC_OR_DEPARTMENT_OTHER): Payer: Self-pay | Admitting: Student in an Organized Health Care Education/Training Program

## 2024-04-01 DIAGNOSIS — B379 Candidiasis, unspecified: Secondary | ICD-10-CM

## 2024-04-01 DIAGNOSIS — B9689 Other specified bacterial agents as the cause of diseases classified elsewhere: Secondary | ICD-10-CM

## 2024-04-01 MED ORDER — FLUCONAZOLE 150 MG TABLET
150.0000 mg | ORAL_TABLET | Freq: Once | ORAL | 0 refills | Status: AC
Start: 2024-04-01 — End: 2024-04-01

## 2024-04-01 MED ORDER — METRONIDAZOLE 500 MG TABLET
500.0000 mg | ORAL_TABLET | Freq: Two times a day (BID) | ORAL | 0 refills | Status: AC
Start: 2024-04-01 — End: 2024-04-08

## 2024-04-07 ENCOUNTER — Encounter (INDEPENDENT_AMBULATORY_CARE_PROVIDER_SITE_OTHER): Payer: Self-pay | Admitting: Otolaryngology

## 2024-04-07 ENCOUNTER — Inpatient Hospital Stay (HOSPITAL_BASED_OUTPATIENT_CLINIC_OR_DEPARTMENT_OTHER)
Admission: RE | Admit: 2024-04-07 | Discharge: 2024-04-07 | Disposition: A | Discharge status: Home / Self Care | Payer: Self-pay | Source: Ambulatory Visit

## 2024-04-07 ENCOUNTER — Ambulatory Visit: Payer: Self-pay | Attending: Otolaryngology | Admitting: Otolaryngology

## 2024-04-07 ENCOUNTER — Other Ambulatory Visit: Payer: Self-pay

## 2024-04-07 VITALS — BP 119/67 | HR 115 | Temp 98.6°F | Ht 63.0 in | Wt 122.0 lb

## 2024-04-07 DIAGNOSIS — J329 Chronic sinusitis, unspecified: Secondary | ICD-10-CM | POA: Insufficient documentation

## 2024-04-07 DIAGNOSIS — J309 Allergic rhinitis, unspecified: Secondary | ICD-10-CM | POA: Insufficient documentation

## 2024-04-07 DIAGNOSIS — R0982 Postnasal drip: Secondary | ICD-10-CM | POA: Insufficient documentation

## 2024-04-07 DIAGNOSIS — Z9889 Other specified postprocedural states: Secondary | ICD-10-CM | POA: Insufficient documentation

## 2024-04-07 DIAGNOSIS — K219 Gastro-esophageal reflux disease without esophagitis: Secondary | ICD-10-CM | POA: Insufficient documentation

## 2024-04-07 MED ORDER — LIDOCAINE 4% AND PHENYLEPHRINE 1% NASAL SOLUTION
5.0000 [drp] | Freq: Once | NASAL | Status: AC
Start: 2024-04-07 — End: ?

## 2024-04-07 NOTE — Progress Notes (Signed)
 Rachel [name]  OTOLARYNGOLOGY DEPARTMENT    Follow up    NAME:  Rachel [name]  MRN:  [id number]  DOB:  [birth year]  DOS:  04/07/2024    Subjective  Rachel [name] is a 83 y.o. female with a history of sinus disease. The patient was last seen on 03/03/2024. At that time, the patient reported her symptoms consisted of recurrent nasal congestion, yellow thick nasal drainage, and forehead pressure headaches in the morning - these symptoms have been occurring annually during the winter time for many years. She had previously been allergy tested >10 years ago and was formerly on injection immunotherapy for 2 years but discontinued after an accidental dosage issue resulting in an anaphylactic-like reaction. She was taking Azelastine and Allegra. She started Flonase.    Today, she reports mucus feels stuck in her throat. Denies sneezing. She is using Flonase and Dexilant. No additional complaints or concerns at this time.    Objective    Physical Exam  Vitals:    04/07/24 1557   BP: 119/67   Pulse: (!) 115   Temp: 37 C (98.6 F)   TempSrc: Temporal   Weight: 55.3 kg (122 lb)   Height: 1.6 m (5\' 3" )   BMI: 21.61         General Appearance: Pleasant, cooperative, healthy, and in no acute distress.  Eyes: Conjunctivae/corneas clear, EOM's intact.  Head and Face: Normocephalic, atraumatic.  Face symmetric, no obvious lesions.   Nose:  External pyramid midline. Septum midline. Mucosa normal. No purulence, polyps, or crusts. See scope note.   Oral Cavity/Oropharynx: No mucosal lesions, masses, or pharyngeal asymmetry.  Hypopharynx/Larynx: Indirect mirror laryngoscopy revealed no hypopharyngeal or laryngeal masses or lesions, normal laryngeal mobility, and voice normal.  Neck:  No palpable thyroid, salivary gland, or neck masses.  Psychiatric:  Alert and oriented x 3.    ENT, PHYSICIAN OFFICE CENTER  [address]  [place] [address]  Operated by [facility]  Procedure  Note    Name: Rachel [name] MRN:  [id number]   Date: 04/07/2024 DOB:  06/02/1941 (82 y.o.)         [address]75 - LARYNGOSCOPY, FLEXIBLE DIAGNOSTIC (AMB ONLY)    Performed by: Rachel Long, MD  Authorized by: Rachel Long, MD    Time Out:     Immediately before the procedure, a time out was called:  Yes    Patient verified:  Yes    Procedure Verified:  Yes    Site Verified:  Yes    Procedure:  Fiberoptic Trans-nasal Laryngoscopy  Operator: Rachel [name]  Anesthesia:  Topical  Findings: Visualization of the hypopharynx/larynx with mirror not adequate for examination and fiberoptic examination performed.  After the nasal cavity was anesthetized/decongested with topical lidocaine/afrin, flexible laryngoscope was passed.    Septum mostly midline. No drainage or edema.   Nasopharynx had normal mucosa and no lesions of the nasopharyngeal walls.  The Eustachian tube orifices were normal.     Hypopharynx:  No lesions of the epiglottis, posterior pharyngeal walls or piriform mucosa.  There is no pooling of secretions.    Larynx:  some arytenoid mucosal erythema noted. The vocal cords are mobile and adduct to midline bilaterally.  There are no lesions.   There is good abduction with sniff.  The airway is patent.        Rachel [name] A. Candelaria Celeste, MD  Associate Professor  Otolaryngology-Head and Neck Surgery  [name]  Rachel Bold, MD    My interpretation of the the images:     CT Sinus performed on 04/07/2024: Sinuses clear  CT LM score: 0 bilaterally    ASSESSMENT  Clinical Impression   None         PLAN:    Flexible scope exam conducted in clinic  Continue nasal sprays and Dexilant  RTC in 2-3 months    Assessment/Plan   1. Allergic rhinitis    2. PND (post-nasal drip)    3. Gastroesophageal reflux disease, unspecified whether esophagitis present          Orders Placed This Encounter    91478 - LARYNGOSCOPY, FLEXIBLE DIAGNOSTIC (AMB ONLY)    lidocaine 4 % + phenylephrine 1 % (1:1) nasal solution     I am scribing for, and in the  presence of, Dr. Alla [name] for services provided on 04/07/2024.  Rachel [name], SCRIBE  Rachel [name], SCRIBE 04/08/2024 08:40    I personally performed the services described in this documentation, as scribed  in my presence, and it is both accurate  and complete.    Rachel Bold, MD      Rachel Lenk A. Rachel Isaacs, MD  Associate Professor  Otolaryngology-Head and Neck Surgery  [name]  [place]    CC:    PCP Rachel Ras, MD  [address]  [place] [address]   Referring Provider Rachel Ngo, PA-C  [address]  [address]  [place],  [place] [address]

## 2024-04-07 NOTE — Procedures (Unsigned)
 ENT, PHYSICIAN OFFICE CENTER  [address]  [place] [address]  Operated by [facility]  Procedure Note    Name: Rachel [name] MRN:  [id number]   Date: 04/07/2024 DOB:  1941/06/14 (82 y.o.)         [address] - LARYNGOSCOPY, FLEXIBLE DIAGNOSTIC (AMB ONLY)    Performed by: Arnold Long, MD  Authorized by: Arnold Long, MD    Time Out:     Immediately before the procedure, a time out was called:  Yes    Patient verified:  Yes    Procedure Verified:  Yes    Site Verified:  Yes    Procedure:  Fiberoptic Trans-nasal Laryngoscopy  Operator: Mozes Sagar  Anesthesia:  Topical  Findings: Visualization of the hypopharynx/larynx with mirror not adequate for examination and fiberoptic examination performed.  After the nasal cavity was anesthetized/decongested with topical lidocaine/afrin, flexible laryngoscope was passed.    Septum mostly midline. No drainage or edema.   Nasopharynx had normal mucosa and no lesions of the nasopharyngeal walls.  The Eustachian tube orifices were normal.     Hypopharynx:  No lesions of the epiglottis, posterior pharyngeal walls or piriform mucosa.  There is no pooling of secretions.    Larynx:  some arytenoid mucosal erythema noted. The vocal cords are mobile and adduct to midline bilaterally.  There are no lesions.   There is good abduction with sniff.  The airway is patent.        Bette Brienza A. Candelaria Celeste, MD  Associate Professor  Otolaryngology-Head and Neck Surgery  [facility]      Arnold Long, [place]

## 2024-04-09 ENCOUNTER — Ambulatory Visit (HOSPITAL_BASED_OUTPATIENT_CLINIC_OR_DEPARTMENT_OTHER): Payer: Self-pay | Admitting: Obstetrics & Gynecology

## 2024-04-09 DIAGNOSIS — J329 Chronic sinusitis, unspecified: Secondary | ICD-10-CM

## 2024-04-09 DIAGNOSIS — J309 Allergic rhinitis, unspecified: Secondary | ICD-10-CM

## 2024-04-10 ENCOUNTER — Ambulatory Visit (HOSPITAL_BASED_OUTPATIENT_CLINIC_OR_DEPARTMENT_OTHER): Payer: Self-pay | Admitting: Student in an Organized Health Care Education/Training Program

## 2024-04-10 NOTE — Telephone Encounter (Signed)
 Regarding: call  ----- Message from Conception Decree sent at 04/09/2024  3:13 PM EDT -----  Copied From CRM #[id number].Rachel [name] is asking for a call back from [place] about pt dose of a rx

## 2024-04-10 NOTE — Telephone Encounter (Signed)
 [facility]  [address]  [place], [place] [address]       PATIENT NAME: Rachel [name]. Fryberger  MRN: [id number]  DOB: 01/21/1941        Date: 04/10/2024  Time: 09:54      Returned Samantha's call with [facility]. No answer. Left voice message with callback number.      Marlea Silvers, BSN RN  Clinical Nurse Coordinator

## 2024-04-15 NOTE — Telephone Encounter (Signed)
 Received Forms from [facility] [employer]. Placing in your MGP-UTC box for review.  Thomes Flicker, Ambulatory Care Assistant  04/15/2024, 09:54

## 2024-04-21 ENCOUNTER — Ambulatory Visit (INDEPENDENT_AMBULATORY_CARE_PROVIDER_SITE_OTHER): Payer: Self-pay | Admitting: Ophthalmology

## 2024-04-21 NOTE — Telephone Encounter (Signed)
 Regarding: [place]  ----- Message from Corliss Dies sent at 04/21/2024 12:51 PM EDT -----  Copied From CRM #[id number].Edwina Gram from [facility] called wanting the Pt to see Dr. Breck Calix for YAG as far as I can see he never did cataract surgery on her.//Lisa can be reached at [phone]

## 2024-04-21 NOTE — Telephone Encounter (Signed)
 Attempted to return call to [facility] regarding scheduling patient. Message received from office left message requesting return call. Aaron Aas, RN  04/21/2024, 13:13

## 2024-04-22 ENCOUNTER — Ambulatory Visit (INDEPENDENT_AMBULATORY_CARE_PROVIDER_SITE_OTHER): Payer: Self-pay | Admitting: Ophthalmology

## 2024-04-22 NOTE — Telephone Encounter (Signed)
 Regarding: Returning Call/Mauger  ----- Message from Corbett Desanctis sent at 04/22/2024  1:04 PM EDT -----  Copied From CRM #[id number].Edwina Gram from [facility] is returning a call they received from the [place]. Please return call [phone]. Thank you .

## 2024-04-22 NOTE — Telephone Encounter (Signed)
 Returned call to [place] at [facility] to schedule patient as requested. Edwina Gram states pt can be scheduled with any provider able to do YAG Offered open appointment on 6.6.25 Appointment accepted and patient added to provider's schedule. Aaron Aas, RN  04/22/2024, 15:37

## 2024-04-24 ENCOUNTER — Encounter (HOSPITAL_COMMUNITY): Payer: Self-pay

## 2024-04-24 ENCOUNTER — Emergency Department (HOSPITAL_COMMUNITY): Admitting: Radiology

## 2024-04-24 ENCOUNTER — Encounter (INDEPENDENT_AMBULATORY_CARE_PROVIDER_SITE_OTHER): Payer: Self-pay

## 2024-04-24 ENCOUNTER — Emergency Department (HOSPITAL_COMMUNITY)

## 2024-04-24 ENCOUNTER — Other Ambulatory Visit: Payer: Self-pay

## 2024-04-24 ENCOUNTER — Ambulatory Visit (INDEPENDENT_AMBULATORY_CARE_PROVIDER_SITE_OTHER): Admitting: Emergency Medicine

## 2024-04-24 ENCOUNTER — Emergency Department
Admission: EM | Admit: 2024-04-24 | Discharge: 2024-04-24 | Disposition: A | Discharge status: Home / Self Care | Attending: Emergency Medicine | Admitting: Emergency Medicine

## 2024-04-24 ENCOUNTER — Ambulatory Visit (INDEPENDENT_AMBULATORY_CARE_PROVIDER_SITE_OTHER)

## 2024-04-24 VITALS — BP 123/72 | HR 70 | Resp 18 | Ht 63.0 in | Wt 122.0 lb

## 2024-04-24 DIAGNOSIS — S79912A Unspecified injury of left hip, initial encounter: Secondary | ICD-10-CM

## 2024-04-24 DIAGNOSIS — M1812 Unilateral primary osteoarthritis of first carpometacarpal joint, left hand: Secondary | ICD-10-CM

## 2024-04-24 DIAGNOSIS — M19042 Primary osteoarthritis, left hand: Secondary | ICD-10-CM

## 2024-04-24 DIAGNOSIS — M19032 Primary osteoarthritis, left wrist: Secondary | ICD-10-CM

## 2024-04-24 DIAGNOSIS — Z23 Encounter for immunization: Secondary | ICD-10-CM | POA: Insufficient documentation

## 2024-04-24 DIAGNOSIS — X58XXXA Exposure to other specified factors, initial encounter: Secondary | ICD-10-CM

## 2024-04-24 DIAGNOSIS — W19XXXA Unspecified fall, initial encounter: Secondary | ICD-10-CM

## 2024-04-24 DIAGNOSIS — S4992XA Unspecified injury of left shoulder and upper arm, initial encounter: Secondary | ICD-10-CM

## 2024-04-24 DIAGNOSIS — S6992XA Unspecified injury of left wrist, hand and finger(s), initial encounter: Secondary | ICD-10-CM

## 2024-04-24 DIAGNOSIS — S59902A Unspecified injury of left elbow, initial encounter: Secondary | ICD-10-CM

## 2024-04-24 DIAGNOSIS — M810 Age-related osteoporosis without current pathological fracture: Secondary | ICD-10-CM

## 2024-04-24 DIAGNOSIS — M7989 Other specified soft tissue disorders: Secondary | ICD-10-CM

## 2024-04-24 DIAGNOSIS — S52502A Unspecified fracture of the lower end of left radius, initial encounter for closed fracture: Secondary | ICD-10-CM

## 2024-04-24 DIAGNOSIS — W010XXA Fall on same level from slipping, tripping and stumbling without subsequent striking against object, initial encounter: Secondary | ICD-10-CM | POA: Insufficient documentation

## 2024-04-24 DIAGNOSIS — Z978 Presence of other specified devices: Secondary | ICD-10-CM

## 2024-04-24 DIAGNOSIS — S52592A Other fractures of lower end of left radius, initial encounter for closed fracture: Secondary | ICD-10-CM | POA: Insufficient documentation

## 2024-04-24 DIAGNOSIS — M1612 Unilateral primary osteoarthritis, left hip: Secondary | ICD-10-CM

## 2024-04-24 DIAGNOSIS — Z9889 Other specified postprocedural states: Secondary | ICD-10-CM

## 2024-04-24 DIAGNOSIS — M25561 Pain in right knee: Secondary | ICD-10-CM

## 2024-04-24 LAB — VITAMIN D 25 TOTAL: VITAMIN D 25, TOTAL: 54.3 ng/mL (ref 20.0–100.0)

## 2024-04-24 LAB — CALCIUM: CALCIUM: 8.9 mg/dL (ref 8.6–10.3)

## 2024-04-24 LAB — ALBUMIN: ALBUMIN: 3.7 g/dL (ref 3.4–4.8)

## 2024-04-24 LAB — AST (SGOT): AST (SGOT): 25 U/L (ref 11–34)

## 2024-04-24 LAB — PARATHYROID HORMONE (PTH): PTH: 70.2 pg/mL (ref 8.5–77.0)

## 2024-04-24 LAB — THYROID STIMULATING HORMONE WITH FREE T4 REFLEX: TSH: 0.642 u[IU]/mL (ref 0.350–4.940)

## 2024-04-24 LAB — ALK PHOS (ALKALINE PHOSPHATASE): ALKALINE PHOSPHATASE: 45 U/L — ABNORMAL LOW (ref 55–145)

## 2024-04-24 MED ORDER — FENTANYL (PF) 50 MCG/ML INJECTION SOLUTION
50.0000 ug | INTRAMUSCULAR | Status: AC
Start: 2024-04-24 — End: 2024-04-24
  Administered 2024-04-24: 50 ug via INTRAVENOUS
  Filled 2024-04-24: qty 2

## 2024-04-24 MED ORDER — HYDROCODONE 5 MG-ACETAMINOPHEN 325 MG TABLET
1.0000 | ORAL_TABLET | Freq: Three times a day (TID) | ORAL | 0 refills | Status: DC | PRN
Start: 2024-04-24 — End: 2024-05-25
  Filled 2024-04-24: qty 9, 3d supply, fill #0

## 2024-04-24 MED ORDER — SODIUM CHLORIDE 0.9% INTRAVENOUS BAG/VIAL WRAPPER FOR MIXTURES
6.0000 mg | INTRAMUSCULAR | Status: DC
Start: 2024-04-24 — End: 2024-04-24
  Filled 2024-04-24: qty 50

## 2024-04-24 MED ORDER — DIPHTH,PERTUSSIS(ACEL),TETANUS 2.5 LF UNIT-8 MCG-5 LF/0.5ML IM SYRINGE
0.5000 mL | INJECTION | INTRAMUSCULAR | Status: AC
Start: 2024-04-24 — End: 2024-04-24
  Administered 2024-04-24: 0.5 mL via INTRAMUSCULAR
  Filled 2024-04-24: qty 0.5

## 2024-04-24 MED ORDER — CHOLECALCIFEROL (VITAMIN D3) 1,250 MCG (50,000 UNIT) CAPSULE
50000.0000 [IU] | ORAL_CAPSULE | ORAL | Status: DC
Start: 2024-04-24 — End: 2024-06-19
  Administered 2024-04-24: 50000 [IU] via ORAL
  Filled 2024-04-24: qty 1

## 2024-04-24 NOTE — Consults (Signed)
 Department of Orthopaedics  H&P / Consult Note  Attending: [place]  Ortho Service: Hand      Identification:  Name: Rachel [name]  Age and Gender: 83 y.o. female  Date of Birth: 1941-10-30  Date of Admission: 04/24/2024  MRN: [id number]  PCP: Lesle Ras, MD  Requesting Service: ED    Reason for Consult:   Left distal radius fracture    SUBJECTIVE:     History Limitations: None     HPI:   Rachel [name] is a 83 y.o. White female with no significant PMH s/p GLF w/ left distal radius fracture. She had immediate pain and deformity of the left wrist. She was originally seen at an Urgent care and was recommended to come to the ED.    Currently complains of left wrist pain. Denies any numbness/tingling of involved extremity. Denies any N/V/D, fevers or chills, pain or paraesthesias in unaffected extremities, any other pain/injury, LOC, CP or SOB.     PMH:  GERD  Depression  OA  Low back pain    PSH:  ACDF  Breast implant removal  Foot surgery  Rotator cuff surgery  Toe amputation  Cataract removal  No h/o problems with anaesthesia    Allergies:  Latex  Darvon  Posion ivy  shellfish    Medications:  Anticoagulation = none  Prior to Admission Medications   Prescriptions Last Dose Informant Patient Reported? Taking?   DULoxetine  (CYMBALTA  DR) 60 mg Oral Capsule, Delayed Release(E.C.)   No No   Sig: Take 1 Capsule (60 mg total) by mouth Once a day   Dexlansoprazole  (DEXILANT ) 60 mg Oral Cap, Delayed Rel., Multiphasic   No No   Sig: Take 1 Capsule (60 mg total) by mouth Daily Indications: gastroesophageal reflux disease, heartburn   Oxycodone  (ROXICODONE ) 10 mg Oral Tablet   No No   Sig: Take 1 Tablet (10 mg total) by mouth Every 6 hours as needed for Pain for up to 30 days Indications: pain, osteoarthritis   azelastine  (ASTELIN ) 137 mcg (0.1 %) Nasal Spray, Non-Aerosol   No No   Sig: Administer 1 Spray into each nostril Twice daily for 180 days Use in each nostril as directed Indications: seasonal runny nose   estradioL   (ESTRACE ) 0.5 mg Oral Tablet   Yes No   Sig: Take 1 Tablet (0.5 mg total) by mouth Daily   fexofenadine  (ALLEGRA  ALLERGY) 180 mg Oral Tablet   No No   Sig: Take 1 Tablet (180 mg total) by mouth Daily for 180 days Indications: inflammation of the nose due to an allergy   fluconazole  (DIFLUCAN ) 150 mg Oral Tablet   No No   Sig: Take 1 Tablet (150 mg total) by mouth One time for 1 dose   fluconazole  (DIFLUCAN ) 150 mg Oral Tablet   No No   Sig: Take 1 Tablet (150 mg total) by mouth One time for 1 dose May repeat after 3 days if symptoms not resolved   fluticasone  propionate (FLONASE ) 50 mcg/actuation Nasal Spray, Suspension   No No   Sig: Administer 1 Spray into each nostril Once a day   fluticasone  propionate (FLONASE ) 50 mcg/actuation Nasal Spray, Suspension   No No   Sig: SHAKE LIQUID AND USE 2 SPRAYS IN EACH NOSTRIL DAILY   medroxyPROGESTERone (PROVERA) 2.5 mg Oral Tablet   Yes No   Sig: Take 1 Tablet (2.5 mg total) by mouth Daily   metroNIDAZOLE  (FLAGYL ) 500 mg Oral Tablet   No No  Sig: Take 1 Tablet (500 mg total) by mouth Twice daily for 7 days   ondansetron  (ZOFRAN  ODT) 4 mg Oral Tablet, Rapid Dissolve   No No   Sig: Take 1 Tablet (4 mg total) by mouth Every 8 hours as needed for Nausea/Vomiting   solifenacin  (VESICARE ) 10 mg Oral Tablet   No No   Sig: TAKE 1 TABLET(10 MG) BY MOUTH DAILY   sumatriptan  succinate (IMITREX ) 100 mg Oral Tablet   No No   Sig: Take 1 Tablet (100 mg total) by mouth Once, as needed for Migraine for up to 12 doses May repeat in 2 hours in needed      Facility-Administered Medications Last Administration Doses Remaining   lidocaine  4 % + phenylephrine  1 % (1:1) nasal solution None recorded 1          SH:   Tobacco = denies  Alcohol = denies  Drugs = denies  Occupation = retired  Human resources officer (prior to incident) = does not use any assistive devices    FH:  No family h/o anesthesia problems per patient's knowledge  No family history of bleeding or clotting disorders  Family Medical History:        Problem Relation (Age of Onset)    Arthritis-osteo Sister    Food Allergy Mother, Sister    No Known Problems Father, Maternal Grandfather, Paternal Grandmother, Paternal Grandfather, Daughter    Pneumonia Mother, Maternal Grandmother    Psoriasis Daughter            ROS:   Negative, except as noted in HPI.    OBJECTIVE    Physical Exam:          Vital signs: BP (!) 153/72   Pulse 88   Temp 37 C (98.6 F)   Resp 16   Ht 1.6 m (5\' 3" )   Wt 55.3 kg (121 lb 14.6 oz)   SpO2 99%   BMI 21.60 kg/m       Constitutional: NAD. A&Ox3. Fair historian. Husband at bedside    Spine:   Musculoskeletal:       RUE:  No pain with active or passive range of motion of the wrist, elbow, shoulder  2+ R pulses  SILT U/M/R/Ax  +AIN / PIN intact,Thumbs up, ok sign, finger cross  No gross motor / sensory deficits         LUE:  No pain with active or passive range of motion of the elbow, shoulder. Swelling and TTP over the wrist with gross deformity  2+ R pulses  SILT U/M/R/Ax  +AIN / PIN intact, Thumbs up, ok sign, finger cross  No gross motor / sensory deficits         RLE:  No pain with active or passive range of motion of the ankle, hip. TTP over the knee  2+ DP/PT pulses  SILT S/S/T/DP/SP  +EHL/FHL/ADF/APF/  No gross motor / sensory deficits       LLE:  No pain with active or passive range of motion of the ankle, knee, hip  2+ DP/PT pulses  SILT S/S/T/DP/SP  +EHL/FHL/ADF/APF/  No gross motor / sensory deficits    Imaging:   Films here:  XR L wrist, hand, forearm, and elbow: showing a displaced distal radius fracture  XR R knee: no acute fracture seen     Pertinent Labs:   Lab Results   Component Value Date    WBC 3.2 (L) 12/31/2022    HGB 10.1 (L) 12/31/2022  HGB 11.1 (L) 10/24/2022    HGB 10.4 (L) 09/05/2022    HCT 30.3 (L) 12/31/2022    PLTCNT 206 12/31/2022     Assessment:  83 y.o. female s/p GLF with left distal radius fracture    Procedures:  [place]  [facility]  Department of Orthopaedics  Bedside  Splint/Cast Application    [name]  [birth year]  [id number]    Date: 04/24/24  Time: 2200    Procedure: Bedside splint and/or cast application    Location: LUE  Indication: Fracture and/or dislocation    Open Wounds/Lacerations: No  Anesthesia: hematoma block  Reduction Maneuver: Yes  Splint type: short arm splint  Material: soft roll and plaster  Dressing: ace    The patient tolerated the above procedure(s) well without any complications. Upon application of the above splint/cast, they did not demonstrate any increased pain and/or paresthesias of the involved extremity. They were instructed to keep the splint clean/dry/intact. They were also instructed to contact the nearest ED/clinic/hospital if they develop sudden increase in pain or paresthesias of the involved extremity. They demonstrated understanding of this. All questions were answered.    Plan/Recommendations:   Please place "Ortho Consult" in Epic.  No surgical intervention by Orthopaedics  Short arm splint applied in the ED. Reduction performed in the ED    Consults: Metabolic Bone (Low Energy Fracture)   Imaging: post reduction X-ray showed good alignment  Weightbearing Status: CCWB LUE   Diet: OK for diet from ortho perspective  Pain Management: Per ED team   Will discuss with staff & plan will be updated as necessary    Barbee Bookman, MD MS  Resident PGY-1  Department of Orthopedic Surgery  Page #: [phone]  04/24/2024, 22:12  I reviewed this patient's studies, the resident  or PA history and physical, and dicussed the findings and treatment options with the patient and resident, fellow, or PA to develop a plan of treatment.      Melquiades Kovar [place], MD 04/25/2024, 44:01

## 2024-04-24 NOTE — ED Nurses Note (Signed)
 Pt presents to ED19 via triage with CC of left wrist pain. Pt fell around 1530 today per her husband and was evaluated at [facility] and advised to present to the ED for ortho evaluation due to a left wrist dislocation. Per xray there is a fracture but the husband believes it was misread. CMS intact. Brace was placed at [facility], pt requested it be taken off.

## 2024-04-24 NOTE — ED Nurses Note (Signed)
 Pt left unit via wheelchair with discharge instructions. Pt verbalized understanding of instructions given. PIV removed with catheter intact. Pressure dressing applied. No s/s distress noted at time of discharge.

## 2024-04-24 NOTE — Nursing Note (Signed)
 Patient has given verbal permission for the scribe to assist the healthcare provider during the patient's visit.Leatha Province, LPN 12/29/1094, 04:54

## 2024-04-24 NOTE — Progress Notes (Signed)
 History of Present Illness: Rachel [name] is a 83 y.o. female who presents to the Urgent Care today with chief complaint of    Chief Complaint              Wrist Injury Left, pt fell backwards on concrete and states that she caught herself with her hand. States that she did not hit her head.           Pt presents to Urgent Care with c/o left wrist pain onset 30 minutes ago. Pt reports that she was moving a plant when she caught the edge of her shoe on her concrete driveway catching herself with her left wrist/hand and arm rolling onto her left hip. Her husband says that pt did not hurt her hip during her fall, pt sts that her hips are painful at baseline. She endorses that she had immediate pain and did not lose consciousness during or prior to her injury. Pt reports no prior injuries or surgeries to her left arm before. Pt says that she has never broken a bone before. She endorses that she is not on any blood thinners.     I reviewed and confirmed the patient's past medical history taken by the nurse or medical assistant with the addition of the following:    Past Medical History:    Past Medical History:   Diagnosis Date    Chronic pain     Depression     GERD (gastroesophageal reflux disease)     Low back pain     Memory changes     Migraine     Neck problem     Osteoarthritis     Shortness of breath     Wears glasses      Past Surgical History:    Past Surgical History:   Procedure Laterality Date    Anterior fusion cervical spine  12/30/2020    Breast implant removal Bilateral     Cystourethroscopy      Foot surgery Bilateral     Hx breast augmentation Bilateral     Hx cataract removal Bilateral 01/12/2021    Hx cervical spine surgery      Hx rotator cuff repair Right     Sacroiliac joint injection      Toe amputation       Allergies:  Allergies   Allergen Reactions    Latex     Darvon [Propoxyphene] Mental Status Effect     Hallucinations    Poison Ivy Extract     Shellfish Derived      Medications:     Current Outpatient Medications   Medication Sig    azelastine  (ASTELIN ) 137 mcg (0.1 %) Nasal Spray, Non-Aerosol Administer 1 Spray into each nostril Twice daily for 180 days Use in each nostril as directed Indications: seasonal runny nose    Dexlansoprazole  (DEXILANT ) 60 mg Oral Cap, Delayed Rel., Multiphasic Take 1 Capsule (60 mg total) by mouth Daily Indications: gastroesophageal reflux disease, heartburn    DULoxetine  (CYMBALTA  DR) 60 mg Oral Capsule, Delayed Release(E.C.) Take 1 Capsule (60 mg total) by mouth Once a day    estradioL  (ESTRACE ) 0.5 mg Oral Tablet Take 1 Tablet (0.5 mg total) by mouth Daily    fexofenadine  (ALLEGRA  ALLERGY) 180 mg Oral Tablet Take 1 Tablet (180 mg total) by mouth Daily for 180 days Indications: inflammation of the nose due to an allergy    fluticasone  propionate (FLONASE ) 50 mcg/actuation Nasal Spray, Suspension Administer 1 Spray into each  nostril Once a day    fluticasone  propionate (FLONASE ) 50 mcg/actuation Nasal Spray, Suspension SHAKE LIQUID AND USE 2 SPRAYS IN EACH NOSTRIL DAILY    medroxyPROGESTERone (PROVERA) 2.5 mg Oral Tablet Take 1 Tablet (2.5 mg total) by mouth Daily    ondansetron  (ZOFRAN  ODT) 4 mg Oral Tablet, Rapid Dissolve Take 1 Tablet (4 mg total) by mouth Every 8 hours as needed for Nausea/Vomiting    solifenacin  (VESICARE ) 10 mg Oral Tablet TAKE 1 TABLET(10 MG) BY MOUTH DAILY    sumatriptan  succinate (IMITREX ) 100 mg Oral Tablet Take 1 Tablet (100 mg total) by mouth Once, as needed for Migraine for up to 12 doses May repeat in 2 hours in needed     Social History:    Social History     Tobacco Use    Smoking status: Never    Smokeless tobacco: Never   Vaping Use    Vaping status: Never Used   Substance Use Topics    Alcohol use: Not Currently    Drug use: Not Currently     Frequency: 5.0 times per week     Types: Marijuana     Comment: medical marijuana gel     Family History:  Family Medical History:       Problem Relation (Age of Onset)    Arthritis-osteo  Sister    Food Allergy Mother, Sister    No Known Problems Father, Maternal Grandfather, Paternal Grandmother, Paternal Grandfather, Daughter    Pneumonia Mother, Maternal Grandmother    Psoriasis Daughter          Review of Systems:    Musculoskeletal: + left wrist pain  All other review of systems were negative    Physical Exam:  Vital signs:   Vitals:    04/24/24 1635   BP: 123/72   Pulse: 70   Resp: 18   SpO2: 97%   Weight: 55.3 kg (122 lb)   Height: 1.6 m (5\' 3" )   BMI: 21.61     Body mass index is 21.61 kg/m. Facility age limit for growth %iles is 20 years.  No LMP recorded. Patient is postmenopausal.    General:  Well appearing and No acute distress  Eyes:  Normal lids/lashes, normal conjunctiva  ENT: MMM  Pulmonary:  No respiratory distress  Musculoskeletal: Pain in the L clavicle and anterior proximal humerus, tenderness in the proximal radius, midshaft radius, dinner fork deformity of the distal forearm, sensation intact to light touch, normal cap refill, pain on palpation of the lateral L hip  Skin:  warm/dry and no rash  Psychiatric:  Appropriate affect and behavior and Normal speech  Neurologic:   Alert and oriented x 3     Data Reviewed:    Radiography:    Left Wrist X-ray Reviewed by Dr. Ammon Bales on 04/24/2024 at 17:40. Displaced distal radius fracture.      Left Elbow X-ray Reviewed by Dr. Ammon Bales on 04/24/2024 at 17:40. No osseous abnormalities.     Left Shoulder X-ray Reviewed by Dr. Ammon Bales on 04/24/2024 at 17:40. No osseous abnormalities.     Left Hip X-ray Reviewed by Dr. Ammon Bales on 04/24/2024 at 17:40. No osseous abnormalities.      Course: Condition at discharge: Good     Differential Diagnosis: Sprain vs Strain vs Fracture    Assessment:   Assessment/Plan   1. Fall    2. Left wrist injury      Plan:    Orders Placed This Encounter  Left Wrist x-ray    Left Elbow x-ray    Left shoulder x-ray    XR HIP LEFT     - Left hip, left wrist, elbow, and shoulder X-rays were performed  in clinic, see data reviewed, discussed results with pt.   - Given fall related injury with multiple body areas of pain and a displaced distal radial fracture, strongly advised ED evaluation and treatment for likely reduction and splinting vs surgical intervention. Pt's husband voiced they intend to return home and consult their physician neighbor. Reiterated importance of prompt emergency evaluation and intervention.     I am scribing for, and in the presence of, Dr. Ammon Bales for services provided on 04/24/2024.  Lenord Radon, SCRIBE   Lenord Radon, SCRIBE 04/24/2024, 16:39    I personally performed the services described in this documentation, as scribed  in my presence, and it is both accurate  and complete.    Huriel Matt C Kaysie Michelini, MD

## 2024-04-24 NOTE — ED Nurses Note (Signed)
 Ortho at bedside.

## 2024-04-24 NOTE — Discharge Instructions (Addendum)
 Patient should f/u with outpatient orthopedics regarding your radius fracture. Norco sent to pharmacy to be used every 8 hours as needed. Should you have intractable pain, unexplained fevers, intractable N/V, or other concerns not appreciated today you may return for evaluation.

## 2024-04-24 NOTE — ED Attending Note (Signed)
 I was physically present and directly supervised this patient's care. Patient was seen and examined. The APP/resident's history and exam were reviewed. Key elements in addition to and/or correction of that documentation are as follows:    Rachel [name] is a 83 y.o. female presents with fall from standing at 3:30 pm, tripped and fell on outstretched L hand, not near-syncope preceding. No head strike, no LOC. L wrist pain. Was able to ambulate after.   No anticoagulation.  No neck or back pain. No hip pain. Some R knee pain, not severe.     Refer to Resident/APP Note for additional history.     PMH: See Primary Note  PSH: See Primary Note  SxH: See Primary Note  FH: See Primary Note    Meditations: See Primary Note    Allergies   Allergen Reactions    Latex     Darvon [Propoxyphene] Mental Status Effect     Hallucinations    Poison Ivy Extract     Shellfish Derived          Pertinent Exam  ED Triage Vitals   BP (Non-Invasive) 04/24/24 1850 (!) 153/72   Heart Rate 04/24/24 1850 88   Respiratory Rate 04/24/24 1850 16   Temperature 04/24/24 1850 37 C (98.6 F)   SpO2 04/24/24 1850 99 %   Weight 04/24/24 1846 55.3 kg (121 lb 14.6 oz)   Height 04/24/24 1846 1.6 m (5\' 3" )     Agree w resident/APP  GCS 15  NC, AT  RRR  CTAB  Abd NT  Pelvis stable non-tender  No midline spine TTP  L wrist TTP/deformity  Very small skin tear L elbow, not concerning for open fracture or traumatic arthrotomy   Distal neurovascularly intact  Mild R knee TTP      Course      Mechanical fall with apparent isolated wrist injury. No head strike, no head pain, GCS 15, not anticoagulated. No neck pain or tenderness. Discussed with patient. Not requiring CT imaging.   Results reviewed.   Medications Ordered/Administered in the ED   fentaNYL  (SUBLIMAZE ) 50 mcg/mL injection (50 mcg Intravenous Given 04/24/24 2014)   fentaNYL  (SUBLIMAZE ) 50 mcg/mL injection (50 mcg Intravenous Given 04/24/24 2122)   diphtheria, pertussis-acell, tetanus (BOOSTRIX) IM  injection (0.5 mL IntraMUSCULAR Given 04/24/24 2208)     Discussed with orthopedics  Impression:   Clinical Impression   Distal radius fracture, left (Primary)   Fall     Disposition:  Discharged    Lalla Pill, MD 04/24/2024, 19:25  Emergency Medicine Attending    Chart completed after conclusion of patient care due to time constraints of direct patient care during shift.

## 2024-04-24 NOTE — ED Provider Notes (Signed)
 [facility] - Emergency Department  ED Primary Provider Note  History of Present Illness   Chief Complaint   Patient presents with    Fall     Pt is a trip and fall, -loc - head strike. Pt c/o left wrist pain was sent here from urgent care.      Rachel [name] is a 83 y.o. female who had concerns including Fall.  Arrival: The patient arrived by Car        Fall        Patient presents after fall from standing which occurred at approximately 3:30 p.m. on 05/02.  Patient fell and caught most of her weight on her left wrist the glancing blow to her left hip.  She denies any head contact or loss of consciousness.  Patient describes the fall as mechanical in which she tripped over her own left foot.  She denies any sensation of syncope or presyncope.  She denies any dizziness or lightheadedness.  She is having isolated left wrist/forearm pain at this time.  Denies any headache, vision changes, chest pain, shortness of breath.  She was able to ambulate after the event.  She denies any neck pain.    History Reviewed This Encounter: Medical History  Surgical History  Family History  Social History    Physical Exam   ED Triage Vitals   BP (Non-Invasive) 04/24/24 1850 (!) 153/72   Heart Rate 04/24/24 1850 88   Respiratory Rate 04/24/24 1850 16   Temperature 04/24/24 1850 37 C (98.6 F)   SpO2 04/24/24 1850 99 %   Weight 04/24/24 1846 55.3 kg (121 lb 14.6 oz)   Height 04/24/24 1846 1.6 m (5\' 3" )     Physical Exam  Constitutional:       Appearance: Normal appearance. She is normal weight. She is not ill-appearing.   HENT:      Mouth/Throat:      Mouth: Mucous membranes are moist.   Cardiovascular:      Rate and Rhythm: Normal rate and regular rhythm.      Pulses: Normal pulses.      Heart sounds: Normal heart sounds.   Pulmonary:      Effort: Pulmonary effort is normal.      Breath sounds: Normal breath sounds.   Abdominal:      General: Abdomen is flat. Bowel sounds are normal.      Palpations: Abdomen  is soft.   Musculoskeletal:      Comments:   No palpable tenderness overlying the feet, ankles, calves, knees, thighs, and hips bilaterally.   No sensation deficits overlying the bilateral lower extremities.  Bilateral lower extremities strength 5/5 in the calves, quads, hamstrings Right arm with full passive and active range of motion.  RUE without tenderness overlying the hand, digits, wrist, elbow, shoulder.  RUE with 5/5 strength throughout hands, intrinsic hand muscles, biceps, triceps, and shoulder.  LUE with obvious wrist deformity.  No sensory changes.  Intrinsic muscles of the hand and thumb intact.  Patient has a appropriate sensation overlying the shoulder, upper arm, and forearm.  Patient is tender overlying the left wrist, proximal left forearm, and medial elbow.  Palpable pulses in bilateral upper extremities.   Neurological:      Mental Status: She is alert.       Patient Data   Labs Ordered/Reviewed - No data to display  XR KNEE RIGHT TRAUMA SERIES   Preliminary Result by Edi, Radresults In (05/02 2224)  No fracture or dislocation. No retained radiopaque foreign objects.         XR WRIST LEFT   Preliminary Result by Edi, Radresults In (05/02 2233)   Improved alignment of left distal radius fracture with cast in place.         XR HAND LEFT   Final Result by Edi, Radresults In (05/02 2042)   Partially imaged acute, mildly displaced, and comminuted fracture of the distal left radius. Otherwise no additional bony fractures identified.       XR FOREARM LEFT   Final Result by Edi, Radresults In (05/02 2042)   Unchanged distal radius fracture with dorsal angulation and questionable intra-articular extension. No additional acute osseous abnormalities. Bones are demineralized.        Medical Decision Making        Medical Decision Making    Patient presents status post fall from standing approximately 4 hours after the incident.  Fall appears mechanical in nature without any head strike or loss of  consciousness.  Patient ambulated without difficulty after the event. Patient exclusively has continued pain in the left wrist/forearm/elbow.  X-ray of the left hip/femur is negative.  X-ray of the left wrist shows comminuted and angulated distal radius fracture.  X-rays of the shoulder, elbow, forearm, and hand were negative for other associated fractures.   Patient is neurovascularly intact at the affected extremity.  Patient has no associated spinal, neck, or head tenderness. Orthopedic surgery consulted at this time. Orthopedic reduced and splinted wrist and will f/u outpatient. Pt provided return precautions and will be discharged at this time.     Amount and/or Complexity of Data Reviewed  Radiology: ordered.    Risk  Prescription drug management.  Parenteral controlled substances.                Medications Ordered/Administered in the ED   cholecalciferol  (VITAMIN D3) 50,000 unit capsule (50,000 Units Oral Given 04/24/24 2233)   fentaNYL  (SUBLIMAZE ) 50 mcg/mL injection (50 mcg Intravenous Given 04/24/24 2014)   fentaNYL  (SUBLIMAZE ) 50 mcg/mL injection (50 mcg Intravenous Given 04/24/24 2122)   diphtheria, pertussis-acell, tetanus (BOOSTRIX) IM injection (0.5 mL IntraMUSCULAR Given 04/24/24 2208)     Clinical Impression   Distal radius fracture, left (Primary)   Fall       Disposition: Discharged           Octaviano Belts, PGY-1, Emergency Medicine

## 2024-04-25 ENCOUNTER — Telehealth (HOSPITAL_COMMUNITY): Payer: Self-pay | Admitting: Student in an Organized Health Care Education/Training Program

## 2024-04-25 ENCOUNTER — Other Ambulatory Visit: Payer: Self-pay

## 2024-04-25 NOTE — Progress Notes (Signed)
 04/25/24-1211-   Post Ed Follow-Up    Post ED Follow-Up:   Document completed and/or attempted interactive contact(s) after transition to home after emergency department stay.:   Transition Facility and relevant Date:   Discharge Date: 04/24/24  Discharge from [facility] Emergency Department?: Yes  Discharge Facility: [facility]  Contacted by: Arlene Ben RN BSN  Contact method: Patient/Caregiver Telephone  Contact completed: 04/25/2024 12:08 PM  MyChart message sent?: No  Was the AVS reviewed with patient?: Yes  Did the patient attempt to reach their PCP prior to going to the ED?: N/A (Comment: As per EPIC- referred to the ED from Urgent care)  How is the patient recovering?: Improving  Medications prescribed: Yes  Were they obtained?: No  Barriers to obtaining meds: has not been to pharmacy yet (Comment: As per patient- husband going to the pharmacy today)  Interventions: Provided patient education  04/25/24-1209- Spoke with patient who appeared alert and appropriate during our conversation advising things are " going well. " Left wrist splint is intact and no current concerns noted. Reenforced rest. Reenforced fu with Ortho as per AVS- patient verbalized understanding and is good with plan. Arlene Ben RN BSN [employer] Nurse Navigator

## 2024-04-26 ENCOUNTER — Encounter (INDEPENDENT_AMBULATORY_CARE_PROVIDER_SITE_OTHER): Payer: Self-pay | Admitting: Rheumatology

## 2024-04-26 NOTE — Progress Notes (Signed)
 Messaged PCP to order a DEXA  I did not see in ED  Morse Ard, MD

## 2024-04-27 ENCOUNTER — Other Ambulatory Visit (HOSPITAL_BASED_OUTPATIENT_CLINIC_OR_DEPARTMENT_OTHER): Payer: Self-pay | Admitting: Student in an Organized Health Care Education/Training Program

## 2024-04-27 ENCOUNTER — Encounter (HOSPITAL_BASED_OUTPATIENT_CLINIC_OR_DEPARTMENT_OTHER): Payer: Self-pay | Admitting: Student in an Organized Health Care Education/Training Program

## 2024-04-27 DIAGNOSIS — T148XXA Other injury of unspecified body region, initial encounter: Secondary | ICD-10-CM

## 2024-04-28 ENCOUNTER — Encounter (INDEPENDENT_AMBULATORY_CARE_PROVIDER_SITE_OTHER): Payer: Self-pay

## 2024-05-04 ENCOUNTER — Other Ambulatory Visit (HOSPITAL_BASED_OUTPATIENT_CLINIC_OR_DEPARTMENT_OTHER): Payer: Self-pay | Admitting: Student in an Organized Health Care Education/Training Program

## 2024-05-04 DIAGNOSIS — M1990 Unspecified osteoarthritis, unspecified site: Secondary | ICD-10-CM

## 2024-05-04 MED ORDER — OXYCODONE 10 MG TABLET
10.0000 mg | ORAL_TABLET | Freq: Four times a day (QID) | ORAL | 0 refills | Status: DC | PRN
Start: 2024-05-04 — End: 2024-06-10

## 2024-05-04 NOTE — Telephone Encounter (Signed)
 Regarding: Refill Request  ----- Message from [facility] sent at 05/04/2024  9:43 AM EDT -----  Copied From CRM #[id number].Doctor Name:  Lesle Ras       Date of last appointment: 03/26/24    Next scheduled visit: 08/03/24    Medication Requested:     Oxycodone  (ROXICODONE ) 10 mg Oral Tablet    Preferred Pharmacy     [id number] - [place], [place] - [address] AT   [facility] [address]    [address] [facility] [place] [address]    Phone: [phone] Fax: [phone]    Hours: Not open 24 hours        Notes for Nurse or Physician:

## 2024-05-13 ENCOUNTER — Other Ambulatory Visit: Payer: Self-pay

## 2024-05-13 ENCOUNTER — Encounter (HOSPITAL_BASED_OUTPATIENT_CLINIC_OR_DEPARTMENT_OTHER): Payer: Self-pay | Admitting: Student in an Organized Health Care Education/Training Program

## 2024-05-13 ENCOUNTER — Ambulatory Visit (HOSPITAL_BASED_OUTPATIENT_CLINIC_OR_DEPARTMENT_OTHER)
Admission: RE | Admit: 2024-05-13 | Discharge: 2024-05-13 | Disposition: A | Discharge status: Home / Self Care | Source: Ambulatory Visit | Admitting: Radiology

## 2024-05-13 ENCOUNTER — Ambulatory Visit
Payer: Self-pay | Attending: Student in an Organized Health Care Education/Training Program | Admitting: Student in an Organized Health Care Education/Training Program

## 2024-05-13 VITALS — Temp 96.8°F | Ht 63.5 in | Wt 118.6 lb

## 2024-05-13 DIAGNOSIS — S52509A Unspecified fracture of the lower end of unspecified radius, initial encounter for closed fracture: Secondary | ICD-10-CM

## 2024-05-13 DIAGNOSIS — S52502A Unspecified fracture of the lower end of left radius, initial encounter for closed fracture: Secondary | ICD-10-CM

## 2024-05-13 NOTE — Nursing Note (Signed)
 [facility] HEALTH  CARE  [facility] [place]  [address]  [place] [address]  [phone]  [phone]    Patient Name: Rachel [name]  MRN: [id number]  Date: 05/13/2024    Orthopaedic Faculty: Denece Finger, MD  [facility] was applied to the left UE.  Instructions were given to patient and family.  Specific instructions were given on proper skin care.  Signs and symptoms of skin problems and/or infection/cast care were reviewed.  Cast care instructions sheet given to the patient.  The patient was instructed to call the Orthopaedic Department with any questions or problems.  Emergency numbers were also given.  The patient/caregiver verbalized understanding of the above.      Cast/device was applied by Thelda Finney, Ambulatory Care Assistant 05/13/2024, 15:55

## 2024-05-13 NOTE — H&P (Signed)
 [place] [place] [facility]  [address]  [place] [address]  Operated by [facility]  DEPARTMENT OF ORTHOPAEDICS    Clinic History and Physical    Patient: Rachel [name]  DOB: [birth year]  MRN: [id number]    Date: 05/13/2024    CHIEF COMPLAINT:  Chief Complaint   Patient presents with    Follow Up     L DR FX        HISTORY OF PRESENT ILLNESS:  Rachel [name] is an 83 yo female, new patient, with a chief complaint of left distal radius fracture.  Patient sustained a left distal radius fracture from tripping on her shoe while playing tennis Apr 24, 2024.  She presented to [facility] where radiographs and wrist reduction were performed.  Patient was placed in a splint and told to follow up with ortho hand.  Patient has been taking hydrocodone  for pain control.  Denies numbness and tingling.  States she is experiencing pain in her thumb at the basal joint.  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    Hand dominance: right  Work: retired  Tobacco use: denies  Alcohol use: denies  Patient ambulation status: no assistive device .   Antiplatelets/Anticoagulation includes: none.    PAST MEDICAL HISTORY:  Past Medical History:   Diagnosis Date    Chronic pain     Depression     GERD (gastroesophageal reflux disease)     Low back pain     Memory changes     Migraine     Neck problem     Osteoarthritis     Shortness of breath     Wears glasses             PAST SURGICAL HISTORY:  Past Surgical History:   Procedure Laterality Date    ANTERIOR FUSION CERVICAL SPINE  12/30/2020    BREAST IMPLANT REMOVAL Bilateral     2022    CYSTOURETHROSCOPY      FOOT SURGERY Bilateral     HX BREAST AUGMENTATION Bilateral     x3, 1974, 1994, 2014    HX CATARACT REMOVAL Bilateral 01/12/2021    HX CERVICAL SPINE SURGERY      HX ROTATOR CUFF REPAIR Right     SACROILIAC JOINT INJECTION      TOE AMPUTATION             FAMILY MEDICAL HISTORY:  Family Medical History:       Problem  Relation (Age of Onset)    Arthritis-osteo Sister    Food Allergy Mother, Sister    No Known Problems Father, Maternal Grandfather, Paternal Grandmother, Paternal Grandfather, Daughter    Pneumonia Mother, Maternal Grandmother    Psoriasis Daughter               MEDICATIONS:    Current Outpatient Medications:     azelastine  (ASTELIN ) 137 mcg (0.1 %) Nasal Spray, Non-Aerosol, Administer 1 Spray into each nostril Twice daily for 180 days Use in each nostril as directed Indications: seasonal runny nose, Disp: 30 mL, Rfl: 5    Dexlansoprazole  (DEXILANT ) 60 mg Oral Cap, Delayed Rel., Multiphasic, Take 1 Capsule (60 mg total) by mouth Daily Indications: gastroesophageal reflux disease, heartburn, Disp: 90 Capsule, Rfl: 3    DULoxetine  (CYMBALTA  DR) 60 mg Oral Capsule, Delayed Release(E.C.), Take 1 Capsule (60 mg total) by mouth Once a day, Disp: 90 Capsule, Rfl: 3    estradioL  (ESTRACE ) 0.5  mg Oral Tablet, Take 1 Tablet (0.5 mg total) by mouth Daily, Disp: , Rfl:     fexofenadine  (ALLEGRA  ALLERGY) 180 mg Oral Tablet, Take 1 Tablet (180 mg total) by mouth Daily for 180 days Indications: inflammation of the nose due to an allergy, Disp: 90 Tablet, Rfl: 1    fluticasone  propionate (FLONASE ) 50 mcg/actuation Nasal Spray, Suspension, Administer 1 Spray into each nostril Once a day, Disp: 48 g, Rfl: 3    fluticasone  propionate (FLONASE ) 50 mcg/actuation Nasal Spray, Suspension, SHAKE LIQUID AND USE 2 SPRAYS IN EACH NOSTRIL DAILY, Disp: 48 g, Rfl: 3    HYDROcodone -acetaminophen  (NORCO) 5-325 mg Oral Tablet, Take 1 Tablet by mouth Every 8 hours as needed for Pain, Disp: 9 Tablet, Rfl: 0    medroxyPROGESTERone (PROVERA) 2.5 mg Oral Tablet, Take 1 Tablet (2.5 mg total) by mouth Daily, Disp: , Rfl:     ondansetron  (ZOFRAN  ODT) 4 mg Oral Tablet, Rapid Dissolve, Take 1 Tablet (4 mg total) by mouth Every 8 hours as needed for Nausea/Vomiting, Disp: 30 Tablet, Rfl: 0    Oxycodone  (ROXICODONE ) 10 mg Oral Tablet, Take 1 Tablet (10 mg  total) by mouth Every 6 hours as needed for Pain for up to 30 days Indications: pain, osteoarthritis, Disp: 120 Tablet, Rfl: 0    solifenacin  (VESICARE ) 10 mg Oral Tablet, TAKE 1 TABLET(10 MG) BY MOUTH DAILY, Disp: 90 Tablet, Rfl: 3    sumatriptan  succinate (IMITREX ) 100 mg Oral Tablet, Take 1 Tablet (100 mg total) by mouth Once, as needed for Migraine for up to 12 doses May repeat in 2 hours in needed, Disp: 12 Tablet, Rfl: 0    Current Facility-Administered Medications:     lidocaine  4 % + phenylephrine  1 % (1:1) nasal solution, 5 Drop, INTRANASAL, Once, Makary, Chadi, MD     ALLERGIES:  Allergies   Allergen Reactions    Latex     Darvon [Propoxyphene] Mental Status Effect     Hallucinations    Poison Ivy Extract     Shellfish Derived         REVIEW OF SYSTEMS:  Other than ROS in the HPI, all other systems were negative    PHYSICAL EXAM:  VITAL SIGNS: Temp 36 C (96.8 F)   Ht 1.613 m (5' 3.5")   Wt 53.8 kg (118 lb 9.7 oz)   BMI 20.68 kg/m     GENERAL: The patient is well developed and well nourished, awake, alert and oriented and appropriate mood and affect. Normal gait and station.   EENT: EOMI, no icterus or rhinorrhea  CHEST: Neck supple, Symmetric nonlabored breathing, no acute respiratory distress  CARDIAC: 2+ peripheral pulses  GASTROINTESTINAL: Non-distended abdomen    MUSCULOSKELETAL:  LEFT UPPER EXTREMITY  SKIN: The skin over the base of the thumb shows mild skin breakdown from splint rubbing the area from her arthritis; there is bruising to the wrist  SWELLING: There is positive focal swelling to left wrist  TENDERNESS: There is positive tenderness to palpation over the distal radius.  ROM: Active ROM of the fingers demonstrates intact flexion and extension.  Wrist range of motion not tested today due to nature of injury  LIGAMENTS:  No ligamentous exam performed today  MUSCLE:  Intact FPL, EPL, and interosseous muscles  SENSATION: Sensation intact to light touch in axillary, median, radial and  ulnar nerve distributions.  VASCULAR: Palpable radial pulse     TESTS REVIEWED:  Radiographs of the left wrist pre and post reduction that performed  Apr 24, 2024 at [facility] were reviewed and analyzed by Dr. Gregary Lean and me.  Per our interpretation, improved alignment postreduction of distal radius fracture.    IMAGING:  Three-view radiographs of the left wrist were performed today in clinic and analyzed by Dr. Gregary Lean and me. Per our interpretation, loss of reduction of distal radius fracture with loss of radial height with translation.     ASSESSMENT:  Rachel [name] is an 83 yo female left distal radius fracture.    PLAN:  Discussed diagnosis and treatment plan moving forward with the patient and her husband.  Reviewed radiographs with them.  Discussed with the patient her fracture is in acceptable alignment to be treated nonoperatively which patient was happy to hear.  Discussed with her once her fracture is healed she may have prominence on the ulnar aspect of her wrist or the wrist may appear crooked but this should not limit her wrist function.  We transitioned her into a short-arm cast.  Encouraged her to avoid lifting more than a coffee cup with the left upper extremity.  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    The patient was given the opportunity to ask questions. All of their questions were answered to their satisfaction. The patient is in agreement with our current treatment plan.     The patient will follow-up in 4 weeks, repeat radiographs of the left wrist out of cast    Patient was seen and evaluated in clinic today with the cosigning physician.     Antonio Baumgarten, PA-C  Department of Orthopaedic Surgery   05/13/24 at 15:58.     ATTESTATION:    I saw and examined the patient as part of a shared service with an APP. I reviewed the midlevel provider's note.  I agree with the findings as documented in the above note. Any exceptions / additions are edited / noted. My substantive findings are:    Radiographs:  Images reviewed by me on the day of the encounter revealed distal radius fracture status post closed reduction.  Overall, the reduction is maintained with some slight increased volar tilt compared to her initial post reduction radiographs.    My findings are consistent with:  Assessment/Plan   1. Distal radius fracture        Plan:   Discussed with the patient my diagnosis and treatment recommendations moving forward.  Operative and non-operative treatment options were discussed.  I recommended beginning with non-operative management.  We transitioned her to a short-arm cast today.  Patient will follow up in 4 weeks with repeat three-view radiographs of the affected wrist out of the cast.    Denece Finger, MD  Assistant Professor  [facility] Department of Orthopaedic Surgery    Denece Finger, MD 05/13/2024 17:13

## 2024-05-15 DIAGNOSIS — S52502A Unspecified fracture of the lower end of left radius, initial encounter for closed fracture: Secondary | ICD-10-CM

## 2024-05-25 ENCOUNTER — Ambulatory Visit (HOSPITAL_BASED_OUTPATIENT_CLINIC_OR_DEPARTMENT_OTHER): Payer: Self-pay | Admitting: Student in an Organized Health Care Education/Training Program

## 2024-05-25 ENCOUNTER — Encounter (INDEPENDENT_AMBULATORY_CARE_PROVIDER_SITE_OTHER): Payer: Self-pay | Admitting: INTERNAL MEDICINE

## 2024-05-25 ENCOUNTER — Ambulatory Visit: Attending: Internal Medicine | Admitting: INTERNAL MEDICINE

## 2024-05-25 ENCOUNTER — Other Ambulatory Visit: Payer: Self-pay

## 2024-05-25 ENCOUNTER — Ambulatory Visit (HOSPITAL_BASED_OUTPATIENT_CLINIC_OR_DEPARTMENT_OTHER)
Admission: RE | Admit: 2024-05-25 | Discharge: 2024-05-25 | Disposition: A | Discharge status: Home / Self Care | Source: Ambulatory Visit

## 2024-05-25 VITALS — BP 157/73 | HR 99 | Temp 97.3°F | Ht 63.5 in | Wt 117.1 lb

## 2024-05-25 DIAGNOSIS — N390 Urinary tract infection, site not specified: Secondary | ICD-10-CM | POA: Insufficient documentation

## 2024-05-25 DIAGNOSIS — R300 Dysuria: Secondary | ICD-10-CM

## 2024-05-25 DIAGNOSIS — R296 Repeated falls: Secondary | ICD-10-CM | POA: Insufficient documentation

## 2024-05-25 DIAGNOSIS — M25552 Pain in left hip: Secondary | ICD-10-CM | POA: Insufficient documentation

## 2024-05-25 LAB — URINALYSIS, MACROSCOPIC
BLOOD: NEGATIVE mg/dL
GLUCOSE: NEGATIVE mg/dL
KETONES: NEGATIVE mg/dL
NITRITE: POSITIVE — AB
PH: 5.5 (ref 5.0–8.0)
PROTEIN: NEGATIVE mg/dL
SPECIFIC GRAVITY: 1.018 (ref 1.005–1.030)
UROBILINOGEN: 3 mg/dL — AB

## 2024-05-25 LAB — URINALYSIS, MICROSCOPIC
HYALINE CASTS: 42 /LPF — ABNORMAL HIGH (ref <4.0)
RBCS: 2 /HPF (ref <6.0)
WBCS: 91 /HPF — ABNORMAL HIGH (ref <11.0)

## 2024-05-25 MED ORDER — AMOXICILLIN 500 MG-POTASSIUM CLAVULANATE 125 MG TABLET
1.0000 | ORAL_TABLET | Freq: Two times a day (BID) | ORAL | 0 refills | Status: AC
Start: 2024-05-25 — End: 2024-06-01

## 2024-05-25 NOTE — Progress Notes (Signed)
 INTERNAL MEDICINE   [facility] at [facility]  Return Visit Progress Note     Name: Rachel [name] Date of Service: 05/25/2024   MRN:  [id number]  Age/DOB: 83 y.o., 1941-06-29 PCP:  Lesle Ras, MD  Reason for Visit: Fall     Subjective:      Rachel [name] is a 83 y.o. female with PMH of  interstitial cystitis, headaches, gastroesophageal reflux disease, osteoarthritis who presents for acute evaluation of hip pain after a fall 2 weeks ago.    Patient states that she had a fall approximately 2 weeks ago in which she became very lightheaded and fell on her left hip.  She notes that since this fall she has had severe left hip pain that has limited her ability to ambulate secondary to the pain.  She describes this pain as mostly a dull ache that can intermittently becomes sharp in nature.  Patient denies any numbness, tingling, weakness of her lower extremities or pain anywhere else.  She was alone at the time of her fall which was unwitnessed, but she made her daughter aware of her hip pain today, and was encouraged to come for evaluation.    Prior to her most recent fall, patient did have another fall on Apr 24, 2024.  During this fall she sustained a distal left radial fracture which was reduced.  She is following with Orthopedics and is having the fracture managed nonoperatively.  Patient states that this fall was mechanical in nature after she tripped on a sidewalk.    She denies any history of fractures prior to this fracture.  She is on chronic opioids for osteoarthritis.  Patient was taking Norco in addition to her normally prescribed oxycodone  after her wrist fracture.  She recently finished her course of Norco 3 days ago.    Additionally, patient complains of some burning with urination that has been occurring for 1 week.  She also has associated increased urinary urgency and frequency.  Patient denies any flank pain, but has felt intermittently fevered.           Medical History:      Past Medical  History, Allergies, Surgical History, Family, and Social History were reviewed  on   05/25/2024 and updated as needed.     MEDICATIONS:  Outpatient Medications Marked as Taking for the 05/25/24 encounter (Office Visit) with Lamaj Metoyer, DO   Medication Sig    amoxicillin -pot clavulanate (AUGMENTIN ) 500-125 mg Oral Tablet Take 1 Tablet by mouth Twice daily for 7 days    azelastine  (ASTELIN ) 137 mcg (0.1 %) Nasal Spray, Non-Aerosol Administer 1 Spray into each nostril Twice daily for 180 days Use in each nostril as directed Indications: seasonal runny nose    Dexlansoprazole  (DEXILANT ) 60 mg Oral Cap, Delayed Rel., Multiphasic Take 1 Capsule (60 mg total) by mouth Daily Indications: gastroesophageal reflux disease, heartburn    fexofenadine  (ALLEGRA  ALLERGY) 180 mg Oral Tablet Take 1 Tablet (180 mg total) by mouth Daily for 180 days Indications: inflammation of the nose due to an allergy    fluticasone  propionate (FLONASE ) 50 mcg/actuation Nasal Spray, Suspension Administer 1 Spray into each nostril Once a day    fluticasone  propionate (FLONASE ) 50 mcg/actuation Nasal Spray, Suspension SHAKE LIQUID AND USE 2 SPRAYS IN EACH NOSTRIL DAILY    ondansetron  (ZOFRAN  ODT) 4 mg Oral Tablet, Rapid Dissolve Take 1 Tablet (4 mg total) by mouth Every 8 hours as needed for Nausea/Vomiting    Oxycodone  (ROXICODONE ) 10 mg  Oral Tablet Take 1 Tablet (10 mg total) by mouth Every 6 hours as needed for Pain for up to 30 days Indications: pain, osteoarthritis    solifenacin  (VESICARE ) 10 mg Oral Tablet TAKE 1 TABLET(10 MG) BY MOUTH DAILY       Review of Systems (Bold is Positive):      Negative unless specified in the HPI      Physical Exam:     Vitals:   height is 1.613 m (5' 3.5") and weight is 53.1 kg (117 lb 1 oz). Her temperature is 36.3 C (97.3 F). Her blood pressure is 157/73 (abnormal) and her pulse is 99. Her oxygen saturation is 94%.      General:  Well appearing, no acute distress   Head:  Normocephalic, atraumatic  Eyes:   Sclareae non-icteric, conjunctivae clear  Cardiovascular:  Regular rate and rhythm, no murmurs, rubs, gallops  Respiratory:  Clear to auscultation bilaterally, no wheezes, rhonchi, crackles  Abdominal:  Bowel sounds normal; abdomen soft, non-tender to palpation  Extremities:  No edema, warm to touch  Neurological:  Awake, A&O x 3. CN II-XII intact, 5/5 strength and sensation in bilateral lower extremities   Musculoskeletal: pain with ambulation, no hip tenderness with palpation, no mid line spinal tenderness   Psychiatric:  Normal mood & affect, thought process linear, speech content normal       Data Reviewed and Interpretation:              Assessment:      Rachel [name] is a 83 y.o. female with PMH of interstitial cystitis, headaches, gastroesophageal reflux disease, osteoarthritis who presents for acute evaluation of hip pain after a fall 2 weeks ago.       Plan:     L Hip pain   -- Xray of bilateral hips ordered   -- can continue oxycodone  as prescribed by PCP, will avoid additional pain medication due to recurrent falls     Urinary Tract Infection   -- UA 05/25/24: positive for leukocytes, nitrites, bacteria   -- Cultures pending   -- Start Augmentin  500 mg BID x 7 days     Recurrent Falls   -- different mechanisms, as first fall seems to be mechanical w/ subsequent fall possibly due to multiple sedating medications.   -- recommend follow up with PCP ASAP for further evaluation of falls   -- would possibly benefit from PT         Follow Up:       Return to clinic ASAP with PCP    Labs and Imaging Needing Follow Up:  Xrays, Urine culture   Goals for Next Visit:  recurrent falls, hip pain     Patient had no questions regarding treatment plan, goals, risks or benefits and agrees to contact me or my clinic in the interim should any questions or problems arise.    Hulen Mandler, DO  Internal Medicine, PGY-1       ________________________________________________________________________    I discussed the patient's  care with the Resident prior to the patient leaving the clinic. Any significant discussion points are noted.      Azalea Lento Johnna Nakai, MD  Assistant Professor, Internal Medicine/Pediatrics  [place]  Alita [facility]win

## 2024-05-25 NOTE — Telephone Encounter (Signed)
 Medical Group Practice  [address]  [place], [place] [address]       PATIENT NAME:  Rachel [name]  MRN: [id number]  DOB: May 09, 1941        Date: 05/25/2024  Time: 10:39      Patient called into call center about left hip pain. Patient stated she fell 2 weeks ago and broke her arm and then fell again 1 week ago and this she may have broke her hip and has not been evaluated since the second fall. Stated she is still able to ambulate but it is very painful. Patient agreeable to appointment at the [facility], scheduled an appointment today 6/2 at 4 pm to get her hip evaluated.      Romie Coast, RN  Clinical Nurse Coordinator

## 2024-05-26 ENCOUNTER — Ambulatory Visit (INDEPENDENT_AMBULATORY_CARE_PROVIDER_SITE_OTHER): Payer: Self-pay | Admitting: INTERNAL MEDICINE

## 2024-05-26 NOTE — Addendum Note (Signed)
 Addended by: Harriett Limbo on: 05/26/2024 09:29 AM     Modules accepted: Level of Service

## 2024-05-27 DIAGNOSIS — M8588 Other specified disorders of bone density and structure, other site: Secondary | ICD-10-CM

## 2024-05-27 LAB — URINE CULTURE: URINE CULTURE: >100000 — AB

## 2024-05-29 ENCOUNTER — Ambulatory Visit (INDEPENDENT_AMBULATORY_CARE_PROVIDER_SITE_OTHER): Payer: Self-pay | Admitting: Student in an Organized Health Care Education/Training Program

## 2024-05-29 ENCOUNTER — Encounter (INDEPENDENT_AMBULATORY_CARE_PROVIDER_SITE_OTHER): Payer: Self-pay

## 2024-06-09 ENCOUNTER — Other Ambulatory Visit (HOSPITAL_BASED_OUTPATIENT_CLINIC_OR_DEPARTMENT_OTHER): Payer: Self-pay | Admitting: Student in an Organized Health Care Education/Training Program

## 2024-06-09 DIAGNOSIS — K219 Gastro-esophageal reflux disease without esophagitis: Secondary | ICD-10-CM

## 2024-06-09 DIAGNOSIS — R110 Nausea: Secondary | ICD-10-CM

## 2024-06-10 ENCOUNTER — Other Ambulatory Visit (HOSPITAL_BASED_OUTPATIENT_CLINIC_OR_DEPARTMENT_OTHER): Payer: Self-pay | Admitting: Student in an Organized Health Care Education/Training Program

## 2024-06-10 ENCOUNTER — Telehealth (HOSPITAL_BASED_OUTPATIENT_CLINIC_OR_DEPARTMENT_OTHER): Payer: Self-pay | Admitting: Student in an Organized Health Care Education/Training Program

## 2024-06-10 DIAGNOSIS — M1990 Unspecified osteoarthritis, unspecified site: Secondary | ICD-10-CM

## 2024-06-10 DIAGNOSIS — K219 Gastro-esophageal reflux disease without esophagitis: Secondary | ICD-10-CM

## 2024-06-10 DIAGNOSIS — R110 Nausea: Secondary | ICD-10-CM

## 2024-06-10 MED ORDER — DEXLANSOPRAZOLE 60 MG CAPSULE,BIPHASE DELAYED RELEASE
60.0000 mg | DELAYED_RELEASE_CAPSULE | Freq: Every day | ORAL | 3 refills | Status: DC
Start: 2024-06-10 — End: 2024-06-10

## 2024-06-10 MED ORDER — DEXLANSOPRAZOLE 60 MG CAPSULE,BIPHASE DELAYED RELEASE: 60.0000 mg | DELAYED_RELEASE_CAPSULE | Freq: Every day | ORAL | 3 refills | Status: DC

## 2024-06-10 NOTE — Telephone Encounter (Signed)
 Regarding: Refill Request  ----- Message from [place] Decree sent at 06/10/2024 11:48 AM EDT -----  Copied From CRM #[id number].Lesle Ras, MD    Sylvan Evener (Self) called to request a prescription refill.     Oxycodone  (ROXICODONE ) 10 mg Oral Tablet    Pt is out of this rx     [facility] #[id number] - [place], [place] - [address] AT [facility] OF [address]  [address] [facility] [address]  Phone: [phone] Fax: [phone]  Hours: Not open 24 hours

## 2024-06-10 NOTE — Telephone Encounter (Signed)
 Received fax requesting refill, Rx pended.  Raea Magallon Jodell Munda, Ambulatory Care Assistant

## 2024-06-11 ENCOUNTER — Ambulatory Visit (INDEPENDENT_AMBULATORY_CARE_PROVIDER_SITE_OTHER): Payer: Self-pay | Admitting: Student in an Organized Health Care Education/Training Program

## 2024-06-11 ENCOUNTER — Other Ambulatory Visit (HOSPITAL_BASED_OUTPATIENT_CLINIC_OR_DEPARTMENT_OTHER): Payer: Self-pay | Admitting: Student in an Organized Health Care Education/Training Program

## 2024-06-11 DIAGNOSIS — K219 Gastro-esophageal reflux disease without esophagitis: Secondary | ICD-10-CM

## 2024-06-11 DIAGNOSIS — R110 Nausea: Secondary | ICD-10-CM

## 2024-06-11 MED ORDER — OXYCODONE 10 MG TABLET
10.0000 mg | ORAL_TABLET | Freq: Four times a day (QID) | ORAL | 0 refills | Status: DC | PRN
Start: 2024-06-11 — End: 2024-07-10

## 2024-06-11 NOTE — Telephone Encounter (Signed)
 Medical Group Practice  [address]  [place], [place] [address]       PATIENT NAME: Rachel [name]  MRN: [id number]  DOB: 02/08/1941        Date: 06/11/2024  Time: 09:08      Attempted to return patients call. Left message with callback number.    Chandler Combs, MSN, RN  Clinical Nurse Coordinator

## 2024-06-11 NOTE — Telephone Encounter (Signed)
 Regarding: Clinical Question  ----- Message from Matthias Sor sent at 06/10/2024  5:01 PM EDT -----  Copied From CRM #[id number].Lesle Ras, MD    Roxie Cord A (Self) called with a clinical question. Pt states that she had a missed call from the clinic, asking for someone to please call her back at [phone]. Please advise. Thank you!

## 2024-06-11 NOTE — Telephone Encounter (Signed)
 Denied  Dexlansoprazole  (DEXILANT ) 60 mg Oral Cap, Delayed Rel., Multiphasic   Prior Authorization Portal   Note from payer: Medical Denial Medical Denial  Payer: [employer] [facility] - Medicare Case ID: [id number]    [id number]  Electronic appeal: Not supported

## 2024-06-11 NOTE — Telephone Encounter (Signed)
 Received Fax from Clinical Service Team Appeals that needs reviewed. Placing it in your MGP-UTC box.   Fremont Jest, RN 06/11/2024, 16:26

## 2024-06-11 NOTE — Telephone Encounter (Signed)
 Last scheduled appointment with you was 03/26/2024.    Currently scheduled future appointment is 08/03/2024.    Patient has been seen within the last year: Yes.    Confirmed preferred pharmacy for this refill encounter is   Preferred Pharmacy       [facility] #[id number] - [place], [place] - [address] AT [facility] OF [address]    [address] [facility] [address]    Phone: [phone] Fax: [phone]    Hours: Not open 24 hours    [facility]    [address] [facility] [place] [address]    Phone: [phone] Fax: [phone]    Hours: 24/7        .     Fremont Jest, RN  06/11/2024, 11:01

## 2024-06-16 ENCOUNTER — Encounter (HOSPITAL_BASED_OUTPATIENT_CLINIC_OR_DEPARTMENT_OTHER): Payer: Self-pay

## 2024-06-17 ENCOUNTER — Encounter (HOSPITAL_BASED_OUTPATIENT_CLINIC_OR_DEPARTMENT_OTHER): Payer: Self-pay | Admitting: Student in an Organized Health Care Education/Training Program

## 2024-06-17 ENCOUNTER — Other Ambulatory Visit: Payer: Self-pay

## 2024-06-17 ENCOUNTER — Ambulatory Visit (HOSPITAL_BASED_OUTPATIENT_CLINIC_OR_DEPARTMENT_OTHER)
Admission: RE | Admit: 2024-06-17 | Discharge: 2024-06-17 | Disposition: A | Discharge status: Home / Self Care | Source: Ambulatory Visit | Admitting: Radiology

## 2024-06-17 ENCOUNTER — Ambulatory Visit
Payer: Self-pay | Attending: Student in an Organized Health Care Education/Training Program | Admitting: Student in an Organized Health Care Education/Training Program

## 2024-06-17 VITALS — HR 96 | Temp 97.0°F

## 2024-06-17 DIAGNOSIS — S52509A Unspecified fracture of the lower end of unspecified radius, initial encounter for closed fracture: Secondary | ICD-10-CM | POA: Insufficient documentation

## 2024-06-17 DIAGNOSIS — S52502P Unspecified fracture of the lower end of left radius, subsequent encounter for closed fracture with malunion: Secondary | ICD-10-CM

## 2024-06-17 NOTE — Progress Notes (Signed)
 [place], [facility]  [address]  [place] [address]  Operated by [facility]  DEPARTMENT OF ORTHOPAEDICS     Clinic Follow-Up Note    Patient: Rachel [name]  DOB: [birth year]  MRN: [id number]    Date: 06/17/2024    DIAGNOSIS:  Assessment/Plan   1. Distal radius fracture       Date of Injury: 04/24/2024    HISTORY OF PRESENT ILLNESS:  Patient seen today for follow up for a left distal radius fracture that we have been treating nonoperatively in a cast.  She has done well since we last saw her in clinic.  She denies any residual pain over the distal radius.  She has been able to maintain good range of motion of the fingers.  Denies any numbness or tingling of the fingers.    Allergies[1]     REVIEW OF SYSTEMS:  Review of Systems     PHYSICAL EXAM:  Pulse 96   Temp 36.1 C (97 F) (Temporal)   SpO2 99%   Constitutional: Awake, alert, and in no acute distress  Respiratory:  Symmetric nonlabored breathing and in no acute respiratory distress   Cardiac:  2+ regular peripheral pulses and brisk capillary refill   Skin:  Exposed skin is dry and intact with no rashes or lesions.  Musculoskeletal:  No tenderness to palpation over the left distal radius.  Able to give a thumbs up, cross index and middle finger, make an O with the index finger, and oppose the thumb.  She is a little stiff with left wrist range of motion.  Sensation of the left hand is intact to light touch in median, radial and ulnar nerve distributions.    TESTS REVIEWED:  Previous notes and images were reviewed.    IMAGING:  Three-view radiographs of the left wrist were performed at today's visit and reveal progressive healing of a left distal radius fracture.  It has healed with slight volar tilt and slightly shortened.    ASSESSMENT:  An 83 year old female about 8 weeks out from a left distal radius fracture that we have treated nonoperatively in a cast    PLAN:  She has done well since we last saw her in  clinic.  At this point, she is okay to discontinue immobilization and start using the wrist and hand as tolerated.  We offered a brace, which she declined.    The patient was given the opportunity to ask questions. All of their questions were answered to their satisfaction. The patient is in agreement with our current treatment plan.     The patient will follow-up p.r.n.    Patient seen and examined by Vernell Settle, PA-C on 06/17/24 at 15:08.    ATTESTATION:    I saw and examined the patient as part of a shared service with an APP. I reviewed the midlevel provider's note. I agree with the findings as documented in the above note. Any exceptions / additions are edited / noted. My substantive findings are:    Radiographs:  Images reviewed by me on the day of the encounter revealed healed distal radius fracture with slight malunion.    My findings are consistent with:  Assessment/Plan   1. Distal radius fracture        Plan:   Discussed with the patient her treatment plan moving forward.  We discussed that her fracture appears to have healed.  However, as we discussed previously, it is healed in a position  of malunion.  Given her age, I do not think this will be much of a problem.  Counseled her that she can gradually return to using her wrist and hand without restrictions moving forward.  Counseled her that it may take some time before she came back to her normal function with this hand.  She can follow up with us  as needed.    Prentice Crafts, MD  Assistant Professor  [facility] Department of Orthopaedic Surgery    Prentice Crafts, MD 06/17/2024 17:09      Please note:  This note was transcribed using voice recognition software.  Because of this technology, there is often unintended grammatical, spelling and other transcription errors.  Please disregard these areas.         [1]   Allergies  Allergen Reactions    Latex     Darvon [Propoxyphene] Mental Status Effect     Hallucinations    Poison Ivy Extract     Shellfish  Derived

## 2024-06-18 ENCOUNTER — Ambulatory Visit (HOSPITAL_BASED_OUTPATIENT_CLINIC_OR_DEPARTMENT_OTHER): Payer: Self-pay | Admitting: Student in an Organized Health Care Education/Training Program

## 2024-06-18 ENCOUNTER — Ambulatory Visit
Admission: RE | Admit: 2024-06-18 | Discharge: 2024-06-18 | Disposition: A | Discharge status: Home / Self Care | Payer: Self-pay | Source: Ambulatory Visit | Attending: Student in an Organized Health Care Education/Training Program | Admitting: Student in an Organized Health Care Education/Training Program

## 2024-06-18 DIAGNOSIS — Z780 Asymptomatic menopausal state: Secondary | ICD-10-CM | POA: Insufficient documentation

## 2024-06-18 DIAGNOSIS — Z13820 Encounter for screening for osteoporosis: Secondary | ICD-10-CM

## 2024-06-18 DIAGNOSIS — T148XXA Other injury of unspecified body region, initial encounter: Secondary | ICD-10-CM | POA: Insufficient documentation

## 2024-06-19 DIAGNOSIS — S52502D Unspecified fracture of the lower end of left radius, subsequent encounter for closed fracture with routine healing: Secondary | ICD-10-CM

## 2024-06-19 NOTE — Telephone Encounter (Signed)
 Appeal faxed to [employer] expedited number [phone] for Dexilant  with chart notes.  Cecillia Devonshire, RN

## 2024-06-22 ENCOUNTER — Telehealth (HOSPITAL_BASED_OUTPATIENT_CLINIC_OR_DEPARTMENT_OTHER): Payer: Self-pay | Admitting: Student in an Organized Health Care Education/Training Program

## 2024-06-22 ENCOUNTER — Ambulatory Visit (HOSPITAL_BASED_OUTPATIENT_CLINIC_OR_DEPARTMENT_OTHER): Payer: Self-pay | Admitting: Student in an Organized Health Care Education/Training Program

## 2024-06-22 DIAGNOSIS — K219 Gastro-esophageal reflux disease without esophagitis: Secondary | ICD-10-CM

## 2024-06-22 DIAGNOSIS — R110 Nausea: Secondary | ICD-10-CM

## 2024-06-22 NOTE — Telephone Encounter (Signed)
 Regarding: Clinical Meds Auth  ----- Message from [place] B sent at 06/22/2024 10:49 AM EDT -----  Copied From CRM #[id number].  [employer]// [employer] () called about a medication authorization.     Benedict Marble, MD    Dexlansoprazole  (DEXILANT ) 60 mg Oral Cap, Delayed Rel., Multiphasic    [employer] received the appeal but there are other questions that they need answered by today.  Please call [place] at [phone] as soon as possible. Thank you!

## 2024-06-22 NOTE — Telephone Encounter (Signed)
 Dexlansoprazole  (DEXILANT ) 60 mg Oral Cap, Delayed Rel., Multiphasic      Received fax from [facility] stating that the above medication is not covered, preferred alternatives are: Pantoprazole, Omeprazole, or Rabeprazole Sodium.     Marijo Holt, RN 06/22/2024, 07:40

## 2024-06-22 NOTE — Telephone Encounter (Signed)
 Medical Group Practice  [address]  [place], [place] [address]       PATIENT NAME: Rachel [name]  MRN: [id number]  DOB: [birth year]        Date: 06/22/2024  Time: 15:00      Returned the call to Ellouise at [place], she wanted to inform us  that the medication is approved for Dexilant  60 mg. They will be sending this to the pharmacy.    Rico Merry, MSN, RN  Clinical Nurse Coordinator

## 2024-06-22 NOTE — Telephone Encounter (Signed)
 Regarding: Clinical Meds Auth  ----- Message from [place] B sent at 06/22/2024  2:53 PM EDT -----  Benedict Marble, MD    Calling to let office know the appeal for the following medication has been approved. The approved dates are for 06/22/24 to 06/22/25. Case # [id number]. Thank you!    ----- Message from [place] B sent at 06/22/2024 10:49 AM EDT -----  Copied From CRM #[id number].  [employer] () called about a medication authorization.     Benedict Marble, MD    Dexlansoprazole  (DEXILANT ) 60 mg Oral Cap, Delayed Rel., Multiphasic    [employer] received the appeal but there are other questions that they need answered by today.  Please call [place] at [phone] as soon as possible. Thank you!

## 2024-06-22 NOTE — Telephone Encounter (Signed)
 Medical Group Practice  [address]  [place], [place] [address]       PATIENT NAME: Rachel [name]  MRN: [id number]  DOB: 1941-12-21        Date: 06/22/2024  Time: 11:04      Returned call to Ellouise from [place], she wanted to know if the patient had failed any other treatment prior. From progress notes 03/26/2024, it stated that she had. Information was relayed. That was all the information needed and a decision will be made later today.      Rico Merry, MSN, RN  Clinical Nurse Coordinator

## 2024-06-23 ENCOUNTER — Ambulatory Visit (INDEPENDENT_AMBULATORY_CARE_PROVIDER_SITE_OTHER): Payer: Self-pay | Admitting: Otolaryngology

## 2024-06-30 ENCOUNTER — Telehealth (HOSPITAL_BASED_OUTPATIENT_CLINIC_OR_DEPARTMENT_OTHER): Payer: Self-pay | Admitting: Student in an Organized Health Care Education/Training Program

## 2024-06-30 ENCOUNTER — Other Ambulatory Visit (HOSPITAL_BASED_OUTPATIENT_CLINIC_OR_DEPARTMENT_OTHER): Payer: Self-pay | Admitting: Student in an Organized Health Care Education/Training Program

## 2024-06-30 NOTE — Telephone Encounter (Signed)
 Received fax from [facility] that needs Reviewed and faxed back. Placed fax in [facility] mailbox.  Dorothe Bitter, Ambulatory Care Assistant  06/30/2024, 11:01

## 2024-06-30 NOTE — Telephone Encounter (Signed)
 Last scheduled appointment with you was 03/26/2024.  Currently scheduled future appointment is 08/03/2024.      Dorothe Bitter, Ambulatory Care Assistant  06/30/2024, 13:01

## 2024-07-10 ENCOUNTER — Other Ambulatory Visit (HOSPITAL_BASED_OUTPATIENT_CLINIC_OR_DEPARTMENT_OTHER): Payer: Self-pay | Admitting: Student in an Organized Health Care Education/Training Program

## 2024-07-10 DIAGNOSIS — M1990 Unspecified osteoarthritis, unspecified site: Secondary | ICD-10-CM

## 2024-07-13 MED ORDER — OXYCODONE 10 MG TABLET
10.0000 mg | ORAL_TABLET | Freq: Four times a day (QID) | ORAL | 0 refills | Status: DC | PRN
Start: 2024-07-13 — End: 2024-08-06

## 2024-07-13 NOTE — Telephone Encounter (Signed)
-----   Message from Delon RAMAN, [place] COORDINATOR sent at 07/13/2024  9:04 AM EDT -----  Regarding: FW: Refill Request  ----- Message from Grayce HERO sent at 07/13/2024  9:03 AM EDT -----  Copied From CRM #[id number].  Posadas, Gulianna A (Self) called to request a prescription refill.     Dr. Edgardo    The pt states she called Friday for the Rx of Oxycodone  10 mg and she is ((out of medicine today)) I explained to the caller, that our office asks up to 24-48 business hours to respond to messages.   She wants a call back to advise.    [facility] #[id number] [place], [place] - [address] AT [facility] OF [address]  [address] [facility] [address]  Phone: [phone] Fax: [phone]  Hours: Not open 24 hours

## 2024-07-13 NOTE — Telephone Encounter (Signed)
 Last scheduled appointment with you was 03/26/2024.  Currently scheduled next future appointment with you is 08/03/2024  The next office visit in this department is 08/03/2024    Damien Chestnut, Ambulatory Care Assistant  07/13/2024, 07:05

## 2024-07-13 NOTE — Telephone Encounter (Signed)
 Medical Group Practice  [address]  [place], [place] [address]       PATIENT NAME: Rachel [name]  MRN: [id number]  DOB: March 01, 1941        Date: 07/13/2024  Time: 09:41        Medical Group Practice  [address]  [place], [place] [address]        Pain contract on file/ up to date? yes  Urine drug screen within the last year? No  Appointment with prescribing provider within the last three months? No      Last office visit: 03/26/2024  Next office visit: 08/03/2024    Attempted to call patient, left message with callback number.    Rico Merry, MSN, RN  Clinical Nurse Coordinator

## 2024-07-17 ENCOUNTER — Other Ambulatory Visit (INDEPENDENT_AMBULATORY_CARE_PROVIDER_SITE_OTHER): Payer: Self-pay | Admitting: Otolaryngology

## 2024-07-17 ENCOUNTER — Other Ambulatory Visit (HOSPITAL_BASED_OUTPATIENT_CLINIC_OR_DEPARTMENT_OTHER): Payer: Self-pay | Admitting: Student in an Organized Health Care Education/Training Program

## 2024-07-17 DIAGNOSIS — N3010 Interstitial cystitis (chronic) without hematuria: Secondary | ICD-10-CM

## 2024-07-17 DIAGNOSIS — T7840XA Allergy, unspecified, initial encounter: Secondary | ICD-10-CM

## 2024-07-17 MED ORDER — SOLIFENACIN 10 MG TABLET: 10.0000 mg | ORAL_TABLET | Freq: Every day | ORAL | 3 refills | Status: DC

## 2024-07-17 MED ORDER — FLUTICASONE PROPIONATE 50 MCG/ACTUATION NASAL SPRAY,SUSPENSION
1.0000 | Freq: Every day | NASAL | 3 refills | Status: DC
Start: 2024-07-17 — End: 2024-11-02

## 2024-07-17 MED ORDER — ONDANSETRON 4 MG DISINTEGRATING TABLET
4.0000 mg | ORAL_TABLET | Freq: Three times a day (TID) | ORAL | 1 refills | Status: AC | PRN
Start: 2024-07-17 — End: 2024-08-16

## 2024-07-17 MED ORDER — AZELASTINE 137 MCG (0.1 %) NASAL SPRAY
1.0000 | Freq: Two times a day (BID) | NASAL | 5 refills | Status: AC
Start: 2024-07-17 — End: 2025-01-13

## 2024-07-17 NOTE — Telephone Encounter (Signed)
 LOV 04/07/24, requesting 90 day supply

## 2024-07-17 NOTE — Telephone Encounter (Signed)
 Last scheduled appointment with you was 03/26/2024.  Currently scheduled next future appointment with you is 08/03/2024  The next office visit in this department is 08/03/2024    Damien Chestnut, Ambulatory Care Assistant  07/17/2024, 08:35

## 2024-07-20 ENCOUNTER — Other Ambulatory Visit: Payer: Self-pay

## 2024-07-20 ENCOUNTER — Telehealth (HOSPITAL_BASED_OUTPATIENT_CLINIC_OR_DEPARTMENT_OTHER): Payer: Self-pay | Admitting: Student in an Organized Health Care Education/Training Program

## 2024-07-20 ENCOUNTER — Ambulatory Visit
Attending: Student in an Organized Health Care Education/Training Program | Admitting: Student in an Organized Health Care Education/Training Program

## 2024-07-20 ENCOUNTER — Ambulatory Visit (HOSPITAL_BASED_OUTPATIENT_CLINIC_OR_DEPARTMENT_OTHER)
Admission: RE | Admit: 2024-07-20 | Discharge: 2024-07-20 | Disposition: A | Discharge status: Home / Self Care | Source: Ambulatory Visit | Admitting: Radiology

## 2024-07-20 DIAGNOSIS — S52502P Unspecified fracture of the lower end of left radius, subsequent encounter for closed fracture with malunion: Secondary | ICD-10-CM

## 2024-07-20 DIAGNOSIS — S52509A Unspecified fracture of the lower end of unspecified radius, initial encounter for closed fracture: Secondary | ICD-10-CM

## 2024-07-20 NOTE — Telephone Encounter (Signed)
 Medical Group Practice  [address]  [place], [place] [address]       PATIENT NAME: Rachel [name]  MRN: [id number]  DOB: [birth year]        Date: 07/20/2024  Time: 07:42       Burkett, Tammy J.  [facility] [place] Clinical Support5 minutes ago (7:33 AM)     Good Morning, pt has reached out thru MyChart for SOB.        Keyasha A Goetzinger  P [facility] (supporting Benedict Marble, MD)2 days ago       Appointment Request From: Rachel [name]     With Provider: Benedict Marble, MD [facility]]     Preferred Date Range: Any date 07/24/2024 or later     Preferred Times: Any     Reason for visit: Return Patient Visit     Health Maintenance Topic:      Comments:  Out of breathPainful legs     Attempted to call patient, left detailed message with call back number. Encouraged patient to be seen at an Urgent Care or the Emergency if she is till experiencing symptoms.       Rico Merry, MSN, RN  Clinical Nurse Coordinator

## 2024-07-21 NOTE — Progress Notes (Signed)
 PATIENT NAME: Rachel [name], Rachel [name]  HOSPITAL NUMBER:  [id number]  DATE OF SERVICE: 07/20/2024  DATE OF BIRTH:  [birth year]    PROGRESS NOTE    DATE OF INJURY:  Apr 24, 2024    CHIEF COMPLAINT:  Three month followup to left distal radius fracture.    SUBJECTIVE:  Rachel [name] is a very pleasant 83 year old female, established patient.  She was treated nonoperatively for her left distal radius fracture with casting.  She is now about 3 months out from her injury.  The patient indicates she has been working hard at regaining her wrist motion.  She has also maintained flexibility of her fingers as well.    PAST MEDICAL HISTORY: Per Epic.  Depression, GERD, memory changes, migraine, and osteoarthritis.    ALLERGIES:  1. LATEX.  2. DARVON.   3. POISON IVY EXTRACT.   4. SHELLFISH DERIVED.    MEDICATIONS:  1. Astelin  nasal spray.  2. Flonase  nasal spray.   3. VESIcare .  4. Roxicodone  p.r.n.  5. Allegra .  6. Zofran  p.r.n.  7. Dexilant .    OBJECTIVE:  General appearance:  Appears healthy, well developed, well nourished in no acute distress.   Vital signs:  None obtained.  Skin:  Exposed skin is warm, dry, and intact.  Cardiac:  Fingers are pink, warm, and well perfused.    Pulmonary:  Breathing is easy and nonlabored.    Neurological:  Alert and oriented x4.  Psychological:  Affect is appropriate to today's visit.    Musculoskeletal:  Exam is limited to her left upper extremity.  Motor function of the hand is intact.  Able to extend her thumb, make an O, cross 2 fingers and spread them out.  Sensation intact to light touch over the radial, ulnar, and median nerve distribution.  Her wrist motion is actually quite good.  She has within normal limits of flexion, extension, supination, and pronation which is near symmetric contralateral side.  She has ease of motion with flexion and extension of her fingers.  She does have some obvious arthritic deformities of her fingers as well as a deformity of her wrist based on the nature of her  fracture.    IMAGING:  An x-ray was obtained today of her left distal radius and reviewed by Dr. Peggie.  Per interpretation, distal radius fracture shows evidence of healing.    ASSESSMENT:  An 83 year old female, established patient, who is now about 3 months out from a left distal radius fracture treated nonoperatively.    PLAN:  1. Discussed with the patient she can pretty much increase use of her left wrist as tolerated.  While she may have some pain still, it should gradually resolve over time.    2. Follow-up will be p.r.n.  Rachel [name] was given the opportunity to ask questions and appeared satisfied with the treatment plan.    A shared visit with the cosigning physician was performed.    Colleen JINNY Jone Rake, APRN, NP-C  [facility]    ATTESTATION:    I saw and examined the patient as part of a shared service with an APP. I reviewed the midlevel provider's note. I agree with the findings as documented in the above note. Any exceptions / additions are edited / noted. My substantive findings are:    Radiographs:  Images reviewed by me on the day of the encounter revealed distal radius fracture malunion.    My findings are consistent with:  Assessment/Plan   1. Distal  radius fracture        Plan:   Discussed with the patient her treatment plan moving forward.  We discussed that she does have a malunion of her distal radius fracture which is the reason for her slightly prominent ulna.  However, functionally, this does not appear to be a problem for her.  She can use her wrist without restriction moving forward.  She can follow up with us  as needed.    Prentice Crafts, MD  Assistant Professor  [facility] Department of Orthopaedic Surgery    Prentice Crafts, MD 07/21/2024 10:48                  DD:  07/20/2024 15:58:16  DT:  07/21/2024 09:52:56 LL  D#:  [id number]

## 2024-07-23 DIAGNOSIS — S52502D Unspecified fracture of the lower end of left radius, subsequent encounter for closed fracture with routine healing: Secondary | ICD-10-CM

## 2024-07-24 ENCOUNTER — Ambulatory Visit (HOSPITAL_BASED_OUTPATIENT_CLINIC_OR_DEPARTMENT_OTHER)
Admission: RE | Admit: 2024-07-24 | Discharge: 2024-07-24 | Disposition: A | Discharge status: Home / Self Care | Source: Ambulatory Visit

## 2024-07-24 ENCOUNTER — Ambulatory Visit: Attending: PHYSICIAN ASSISTANT | Admitting: PHYSICIAN ASSISTANT

## 2024-07-24 ENCOUNTER — Other Ambulatory Visit: Payer: Self-pay

## 2024-07-24 ENCOUNTER — Ambulatory Visit (HOSPITAL_BASED_OUTPATIENT_CLINIC_OR_DEPARTMENT_OTHER): Payer: Self-pay | Admitting: Student in an Organized Health Care Education/Training Program

## 2024-07-24 VITALS — BP 134/62 | HR 96 | Temp 97.5°F | Ht 63.39 in | Wt 114.6 lb

## 2024-07-24 DIAGNOSIS — R110 Nausea: Secondary | ICD-10-CM | POA: Insufficient documentation

## 2024-07-24 DIAGNOSIS — R208 Other disturbances of skin sensation: Secondary | ICD-10-CM | POA: Insufficient documentation

## 2024-07-24 DIAGNOSIS — K219 Gastro-esophageal reflux disease without esophagitis: Secondary | ICD-10-CM | POA: Insufficient documentation

## 2024-07-24 DIAGNOSIS — R300 Dysuria: Secondary | ICD-10-CM | POA: Insufficient documentation

## 2024-07-24 DIAGNOSIS — R630 Anorexia: Secondary | ICD-10-CM | POA: Insufficient documentation

## 2024-07-24 MED ORDER — FAMOTIDINE 20 MG TABLET
20.0000 mg | ORAL_TABLET | Freq: Two times a day (BID) | ORAL | 1 refills | Status: DC
Start: 2024-07-24 — End: 2024-10-13

## 2024-07-24 NOTE — Nursing Note (Signed)
 07/24/24 1400   Urine Test   Performed Status: Manual   Time collected 1423   Manufacturer Siemens   Urine Test - Siemens   Color (Ref Range: Yellow) Yellow   Clarity (Ref Range: Clear) Clear   Glucose (Ref Range: Negative mg/dL) Negative   Bilirubin (Ref Range: Negative mg/dL) (!) 3+   Ketones (Ref Range: Negative mg/dL) Negative   Urine Specific Gravity (Ref Range: 1.005 - 1.030) 1.020   Blood (urine) (Ref Range: Negative mg/dL) Negative   pH (Ref Range: 5.0 - 8.0) 5.0   Protein (Ref Range: Negative mg/dL) (!) 1+ (30mg /dL)   Urobilinogen (Ref Range: Normal) Normal    Nitrite (Ref Range: Negative) Negative   Leukocytes (Ref Range: Negative WBC's/uL) Trace   Bottle Number (Siemens Multistix 10 SG) 17   Lot # [id number]   Expiration Date 05/23/25   Initials BW

## 2024-07-24 NOTE — Patient Instructions (Addendum)
 Pepcid  20 mg twice daily in addition to dexilant     Get abdominal xray today    Gastric emptying study for gastroparesis ordered-you will get a call to schedule

## 2024-07-24 NOTE — Telephone Encounter (Signed)
 [facility]  [address]  [place], [place] [address]       PATIENT NAME: Rachel [name]  MRN: [id number]  DOB: 05/26/1941        Date: 07/24/2024  Time: 08:49      Patient called through the triage line, stated that she is having burning below her waist line. Felt that she was constipated so she took senokot (last evening) with results. States that she has had a bowel movement and it was normal. Patient states that the burning has been for about 3 weeks and feels that it is worsening. Denies odor with urination or pain. Patient denies flank pain or fever. States that she drinking lots of water  and urinating every 2 hours. Patient is requesting to be seen. Appointment has been made for today 07/24/2024 @ 130 PM with a 115 PM arrival time. Patient is agreeable to time and date. Patient encouraged if she feel that she is worsening to go to the Urgent Care or the Emergency room.    Rico Merry, MSN, RN  Clinical Nurse Coordinator

## 2024-07-24 NOTE — Progress Notes (Signed)
 INTERNAL MEDICINE   [facility]  Acute Visit Progress Note     Name: Rachel [name] Date of Service: 07/24/2024   MRN:  [id number]  Age/DOB: 83 y.o., 06-Jan-1941 PCP:  Benedict Marble, MD  Reason for Visit: Abdominal Pain (Burning across lower abd. )       This note was created with assistance from [employer] via capture of conversational audio.  Consent was obtained from the patient prior to recording.        CHIEF COMPLAINT:   Abdominal Pain (Burning across lower abd. )      SUBJECTIVE:  History of Present Illness  Rachel [name] is an 83 year old female who presents with urinary symptoms and abdominal discomfort.    She has been experiencing urinary symptoms for a prolonged period, initially treated with medication, but symptoms have persisted. She has increased urinary frequency, particularly nocturia, needing to urinate every two hours at night, which disrupts her sleep. She is uncertain about complete bladder emptying and experiences dysuria. No hematuria is noted.    She has a history of GERD and has been experiencing worsening symptoms over the past several weeks. Despite taking Dexilant  daily, she experiences acid reflux, especially when lying down, and reports a burning sensation in her lower abdomen. She has difficulty eating due to the reflux and experiences nausea. She has tried various over-the-counter medications without relief. She reports one episode of vomiting and describes her stool as normal in consistency but yellow in color. She has a history of gastroparesis diagnosed over three years ago in [place] .  Her diet is very bland, influenced by her father's history of ulcers, and she avoids spicy foods. Her husband prefers spicy foods, which she cannot tolerate. She has been consuming Mylanta for symptom relief.    OBJECTIVE:  Current Outpatient Medications   Medication Sig    azelastine  (ASTELIN ) 137 mcg (0.1 %) Nasal Spray, Non-Aerosol Administer 1 Spray into each nostril Twice daily  for 180 days Use in each nostril as directed Indications: seasonal runny nose    Dexlansoprazole  (DEXILANT ) 60 mg Oral Cap, Delayed Rel., Multiphasic Take 1 Capsule (60 mg total) by mouth Daily Indications: gastroesophageal reflux disease, heartburn    famotidine  (PEPCID ) 20 mg Oral Tablet Take 1 Tablet (20 mg total) by mouth Twice daily    fexofenadine  (ALLEGRA  ALLERGY) 180 mg Oral Tablet Take 1 Tablet (180 mg total) by mouth Daily for 180 days Indications: inflammation of the nose due to an allergy    fluticasone  propionate (FLONASE ) 50 mcg/actuation Nasal Spray, Suspension Administer 1 Spray into each nostril Once a day    fluticasone  propionate (FLONASE ) 50 mcg/actuation Nasal Spray, Suspension Administer 1 Spray into each nostril Daily    ondansetron  (ZOFRAN  ODT) 4 mg Oral Tablet, Rapid Dissolve Take 1 Tablet (4 mg total) by mouth Every 8 hours as needed for Nausea/Vomiting for up to 30 days    Oxycodone  (ROXICODONE ) 10 mg Oral Tablet Take 1 Tablet (10 mg total) by mouth Every 6 hours as needed for Pain for up to 30 days Indications: pain, osteoarthritis    solifenacin  (VESICARE ) 10 mg Oral Tablet Take 1 Tablet (10 mg total) by mouth Daily     BP 134/62 (Site: Left Arm, Patient Position: Sitting)   Pulse 96   Temp 36.4 C (97.5 F) (Temporal)   Ht 1.61 m (5' 3.39)   Wt 52 kg (114 lb 10.2 oz)   SpO2 98%   BMI 20.06 kg/m  Physical Exam  GENERAL: Alert, cooperative, well developed, no acute distress.  HEENT: Normocephalic, normal oropharynx, moist mucous membranes.  CHEST: Clear to auscultation bilaterally, no wheezes, rhonchi, or crackles.  CARDIOVASCULAR: Normal heart rate and rhythm, S1 and S2 normal without murmurs.  ABDOMEN: Soft, mild tenderness in epigastric region, non-distended, without organomegaly, normal bowel sounds.  EXTREMITIES: No cyanosis or edema.  NEUROLOGICAL: Cranial nerves grossly intact, moves all extremities without gross motor or sensory deficit.    RESULTS:  Results        07/24/24 1400   Urine Test   Performed Status: Manual   Time collected 1423   Manufacturer Siemens   Urine Test - Siemens   Color (Ref Range: Yellow) Yellow   Clarity (Ref Range: Clear) Clear   Glucose (Ref Range: Negative mg/dL) Negative   Bilirubin (Ref Range: Negative mg/dL) (!) 3+   Ketones (Ref Range: Negative mg/dL) Negative   Urine Specific Gravity (Ref Range: 1.005 - 1.030) 1.020   Blood (urine) (Ref Range: Negative mg/dL) Negative   pH (Ref Range: 5.0 - 8.0) 5.0   Protein (Ref Range: Negative mg/dL) (!) 1+ (30mg /dL)   Urobilinogen (Ref Range: Normal) Normal    Nitrite (Ref Range: Negative) Negative   Leukocytes (Ref Range: Negative WBC's/uL) Trace   Bottle Number (Siemens Multistix 10 SG) 17   Lot # [id number]   Expiration Date 05/23/25   Initials BW           ASSESSMENT/PLAN:  Assessment & Plan  (R11.0) Nausea  (primary encounter diagnosis)  Plan: NUC GASTRIC EMPTYING STUDY, famotidine  (PEPCID )        20 mg Oral Tablet        Chronic GERD with recent exacerbation, severe epigastric pain, and nausea. On maximum Dexilant . Symptoms suggest acid reflux and possible gastroparesis. OTC remedies ineffective.  - Add Pepcid  20 mg twice daily to Dexilant .  - Continue Mylanta as needed.  - Order gastric emptying study.  - Consider GI specialist referral based on study results.  - Order abdominal x-ray to rule out other causes of abdominal pain.    (R63.0) Decreased appetite  Plan: NUC GASTRIC EMPTYING STUDY        Chronic GERD with recent exacerbation, severe epigastric pain, and nausea. On maximum Dexilant . Symptoms suggest acid reflux and possible gastroparesis. OTC remedies ineffective.  - Add Pepcid  20 mg twice daily to Dexilant .  - Continue Mylanta as needed.  - Order gastric emptying study.  - Consider GI specialist referral based on study results.  - Order abdominal x-ray to rule out other causes of abdominal pain.    (K21.9) Gastroesophageal reflux disease, unspecified whether esophagitis present  Plan: NUC  GASTRIC EMPTYING STUDY, famotidine  (PEPCID )        20 mg Oral Tablet        Chronic GERD with recent exacerbation, severe epigastric pain, and nausea. On maximum Dexilant . Symptoms suggest acid reflux and possible gastroparesis. OTC remedies ineffective.  - Add Pepcid  20 mg twice daily to Dexilant .  - Continue Mylanta as needed.  - Order gastric emptying study.  - Consider GI specialist referral based on study results.  - Order abdominal x-ray to rule out other causes of abdominal pain.    (R20.8) Suprapubic abdominal burning sensation  Plan: KUB        - Order abdominal x-ray to rule out other causes of abdominal pain.  -Urine dip shows no signs of infection. Patient left after providing urine sample so will be contacted to let  her know there is no sign of persistent UTI. She would like to discuss medications for OAB with her PCP in the future.    (R30.0) Dysuria  Plan: POCT Urine dipstick        -Urine dip shows no signs of infection. Patient left after providing urine sample so will be contacted to let her know there is no sign of persistent UTI. She would like to discuss medications for OAB with her PCP in the future.          Follow Up:       Return to clinic in 2 weeks for PCP follow up.    Patient had no questions regarding treatment plan, goals, risks or benefits and agrees to contact me or my clinic in the interim should any questions or problems arise.    Richerd Bullion, PA-C  Internal Medicine, [facility]  [address]   [place], [place], [address]  Phone: [phone]  Fax: [phone]

## 2024-07-26 NOTE — Patient Instructions (Incomplete)
 Abdominal Pain  Abdominal pain means pain in the stomach or belly area. Everyone has this kind of pain from time to time. In many cases, it goes away on its own. Some types of abdominal pain can be from a serious problem. One example is appendicitis. So it's important to know when to get help.     Causes of abdominal pain  There are many causes of abdominal pain. Common causes in adults include:  Constipation, diarrhea, or gas  Stomach and intestine inflammation from a virus or bacteria (gastroenteritis)  Stomach acid flowing back up into the esophagus (acid reflux)  Severe acid reflux, called gastroesophageal reflux disease (GERD)  A sore in the lining of the stomach or small intestine (peptic ulcer)  Inflammation of the gallbladder, liver, or pancreas  Gallstones or kidney stones  Appendicitis   Intestinal blockage   An internal organ pushing through a muscle or other tissue (hernia)  Urinary tract infections  Menstrual cramps  Fibroids in the uterus  Ovarian cysts  Pelvic inflammatory disease in women  Endometriosis  Crohn's disease  Ulcerative colitis  Irritable bowel syndrome  Diagnosing the cause of abdominal pain  Your healthcare provider will give you a physical exam. This is to help find the cause of your pain. If needed, you'll have tests. Belly pain has many possible causes. So it may take a little time to find the reason for your pain. Give details about the type of pain you feel. Tell your healthcare provider if it's sharp or dull. Tell them where and when you feel the pain. Tell them what makes it better or worse. And tell them if you have other symptoms such as:   Fever  Tiredness  Upset stomach (nausea)  Vomiting  Changes in bathroom habits  Blood in the stool or black, tarry stool  Unexpected weight loss  Tell your healthcare provider:  If you have a family history of stomach or intestinal problems or cancer  About your alcohol use and any illegal drug use  All medicines you take, both prescription  and over-the-counter  What vitamins, herbs, and other supplements you take  Treating abdominal pain   Some causes of pain need emergency medical care right away. These include appendicitis or a bowel blockage. Other problems can be treated with rest, fluids, or medicines. Your healthcare provider can give you instructions. You may need treatment or self-care based on what's causing your pain.   If you have vomiting or diarrhea,  sip water  or other clear fluids. When you're ready to eat solid foods again, start lightly. Eat small amounts of easy-to-digest, low-fat foods. These include applesauce, toast, or crackers.   Call 911  Call 911 right away if you:   Can't pass stool and are vomiting  Are vomiting blood  Have bloody diarrhea or black, tarry diarrhea  Have chest, neck, or shoulder pain  Feel like you might pass out (faint)  Have pain in your shoulder blades and nausea  Have sudden, severe belly pain  Have new, severe pain unlike any you've felt before  Have a belly that is rigid, hard, and hurts to touch  When to call the healthcare provider  Call your healthcare provider or seek medical care right away if you have any of these:   Pain that's worse or not getting better  Bloating that's worse or not getting better  Diarrhea that's worse or not getting better  Fever of 100.63F (38C) or higher, or as advised  Weight  loss for no reason  Continued lack of appetite  Blood in your stool  How to prevent abdominal pain   Here are some tips to help prevent abdominal pain:   Eat smaller amounts of food at each meal.  >Don't eat greasy, fried, or other high-fat foods.  Don't eat foods that give you gas.  Exercise regularly.  Drink plenty of fluids.  To help prevent GERD symptoms:   Quit smoking.  Reduce alcohol and foods that increase stomach acid.  Don't use aspirin or nonsteroidal anti-inflammatory drugs (NSAIDs).  Lose excess weight.  Finish eating at least 2 hours before you go to bed or lie down.  Raise the head of  your bed.  [employer] last reviewed this educational content on 02/22/2023      What is constipation?   This is when it is hard to have bowel movements. Constipation is common. Your bowel movements might be:  ?Too hard  ?Too small  ?Hard to get out  ?Happening fewer than 3 times a week  Can medicines cause constipation?   Constipation can be a side effect of some medicines. Many medicines slow down how fast food moves through your stomach and bowels. This makes your stool harder and more difficult to pass.  You might have problems with constipation if you take:  ?Opioid medicines for pain  ?Iron to treat anemia  ?Medicines to treat conditions like Parkinson disease, bladder problems, or breathing problems  Do not stop taking your medicine just because you get constipation. Ask your doctor if there is a medicine you could switch to that causes less constipation.  What can I do on my own to treat constipation?   You can try these steps:  ?Eat foods that have a lot of fiber. Good choices are fruits, vegetables, prune juice, and cereal (figure 1).  ?Drink plenty of water  and other fluids.  ?Go to the bathroom when you need to. Don't hold it.  ?Take laxatives. These are medicines that help make bowel movements easier to get out. They come in different forms. Some are pills, powders, capsules, or liquids you swallow. Others go into your rectum. These are called suppositories or enemas.  There are a few kinds of laxatives. Examples include:  Bulk adding - This helps your stool hold more water  and makes it bigger.  Stool softener - This makes your stool wetter and softer. It does not stimulate your bowel in any way.  Lubricant - This coats your bowel and stool to make stools easier to pass. It also helps prevent your intestines from absorbing water  from your stool.  Osmotic laxatives - These draw more water  into your stool to make it wetter and softer.  Stimulant - This increases movement in your bowels.  Talk with your  doctor or nurse about the best kind of laxative for you.  When should I call the doctor?   Call for advice if you:  ?Do not have a bowel movement for a few days  ?Are in a lot of pain  ?Have white or chalk-colored bowel movements  ?Are vomiting along with having constipation  ?Have other symptoms that also worry you (for example, bleeding, weakness, weight loss, or fever)      CONSTIPATION OVERVIEW   Constipation refers to a change in bowel habits, but it has varied meanings. Stools may be too hard or too small, difficult to pass, or infrequent (less than three times per week). People with constipation may also notice a frequent  need to strain and a sense that the bowels are not empty.  Constipation is a very common problem. Each year more than 2.5 million Americans visit their health care provider for relief from this problem. Many factors can contribute to or cause constipation, although in most people, no single cause can be found. In general, constipation occurs more frequently as you get older. (See Etiology and evaluation of chronic constipation in adults.)  CONSTIPATION DIAGNOSIS   Constipation can usually be diagnosed based on your symptoms and a physical examination. You should also mention any medications you take regularly since some medications can cause constipation.  You may need a rectal examination as part of a physical examination. A rectal examination involves inserting a gloved finger inside the rectum to feel for any abnormalities. This test can also check for blood in the stool.  Further testing may be ordered in some situations, for example, if you have had a recent change in bowel habits, blood in the stool, weight loss, or a family history of colon cancer. Testing may include blood tests, x-rays, sigmoidoscopy, colonoscopy, or more specialized testing if needed. (See Patient education: Flexible sigmoidoscopy (Beyond the Basics) and Patient education: Colonoscopy (Beyond the  Basics).)  When to seek help -- Most people can treat constipation at home, without seeing a health care provider. However, you should speak with a health care provider if the problem:  ?Is new (ie, represents a change in your normal pattern)  ?Lasts longer than three weeks  ?Is severe  ?Is associated with any other concerning features such as blood on the toilet paper, weight loss, fevers, or weakness  CONSTIPATION TREATMENT   Treatment for constipation includes changing some behaviors, eating foods high in fiber, and using laxatives or enemas if needed.  You can try these treatments at home, before seeing a health care provider. However, if you do not have a bowel movement within a few days, you should call your health care provider for further assistance. (See Management of chronic constipation in adults.)  Behavior changes -- There are a few different behavior changes you can make to try to improve constipation:  ?Try having a bowel movement after a meal. Bowels are most active following meals, and this is often the time when stools will pass most readily. By paying close attention to these signals, you may have an easier time moving your bowels.  ?Don't ignore the urge to have a bowel movement. If you ignore your body's signals to have a bowel movement, the signals become weaker and weaker over time.  ?It can be helpful to use a small stool beneath your feet when you are having a bowel movement. This changes the angle of the rectum and can help stool pass more easily.  ?Make sure you are getting plenty of physical activity and exercise. Some people find this helpful to relieve constipation.  ?Make sure you are drinking water  and staying hydrated, especially as you increase fiber intake. Mineral water  containing magnesium  might be more helpful than regular water  to relieve constipation. In addition, drinking a caffeine -containing beverage in the morning may also be helpful.  Increase fiber -- Increasing fiber  in your diet may reduce or eliminate constipation. The recommended amount of dietary fiber is 20 to 35 grams of fiber per day. By reading the product information panel on the side of the package, you can determine the number of grams of fiber per serving (figure 1).  Many fruits and vegetables can be particularly helpful  in preventing and treating constipation (table 1). This is especially true of citrus fruits, prunes, and prune juice. Some breakfast cereals are also an excellent source of dietary fiber. (See Patient education: High-fiber diet (Beyond the Basics).)  Fiber side effects -- Consuming large amounts of fiber can cause abdominal bloating or gas; this can be minimized by starting with a small amount and slowly increasing until stools become softer and more frequent.  LAXATIVES   If behavior changes and increasing fiber does not relieve your constipation, you may try taking a laxative. A variety of laxatives are available for treating constipation. The choice between them is based on how they work, how safe the treatment is, and your health care provider's preferences.  In general, laxatives can be categorized into the following groups:  Bulk-forming laxatives -- These include natural fiber and commercial fiber preparations such as:  ?Psyllium (Konsyl; Metamucil; Perdiem)  ?Methylcellulose (Citrucel)  ?Calcium polycarbophil (FiberCon; Fiber-Lax; Mitrolan)  ?Wheat dextrin (Benefiber)  You should increase the dose of fiber supplements slowly to prevent gas and cramping, and you should always take the supplement with plenty of fluid.  Hyperosmolar laxatives -- Hyperosmolar laxatives include:  ?Polyethylene glycol (MiraLAX , GlycoLax )  ?Lactulose  ?Sorbitol  Polyethylene glycol is generally preferred since it does not cause gas or bloating and is available in the [place]  without a prescription. Lactulose and sorbitol can produce gas and bloating. Sorbitol works as well as lactulose and is much less  expensive. Natural sorbitol is found in prunes and raisins if one likes these foods.  Magnesium -based laxatives -- Magnesium -based laxatives such as magnesium  hydroxide (Milk of Magnesia) and magnesium  citrate (Evac-Q-Mag) act similarly to the hyperosmolar laxatives.  Stimulant laxatives -- Stimulant laxatives include senna (eg, [address], Ex-Lax, Fletcher's, [place], Senokot) and bisacodyl (eg, Correctol, Doxidan, Dulcolax).  Some people overuse stimulant laxatives. Taking stimulant laxatives regularly or in large amounts can cause side effects, including low potassium levels. Thus, you should take these drugs carefully if you must use them regularly.  However, there is no convincing evidence that using stimulant laxatives regularly damages the colon, and they do not increase the risk for colorectal cancer or other tumors.  Newer prescription treatments -- Lubiprostone (Amitiza), linaclotide (Linzess), plecanatide (Trulance), prucalopride (Motegrity), and tenapanor Teryl) are prescription medications that treat severe constipation. They are expensive but may be recommended if you do not respond to other treatments.  Pills, suppositories, or enemas? -- Laxatives are available as pills that you take by mouth or as suppositories or enemas that you insert into the rectum. In general, suppositories and enemas work more quickly compared with pills, but many people do not like using them.  Healthcare providers occasionally recommend prepackaged enema kits containing sodium phosphate /biphosphate [facility]) if you have not responded to other treatments. These are not recommended if you have problems with your heart or kidneys and should not be used more than once unless directed by your health care provider.  Constipation treatments to avoid  ?Emollients - Emollient laxatives, principally mineral oil, soften stools by moisturizing them. However, other treatments have fewer risks and equal benefit.  ?Natural products -  A wide variety of natural products are advertised for constipation. Some of them contain the active ingredients found in commercially available laxatives. However, their dose and purity may not be carefully controlled. Thus, these products are not generally recommended.  A variety of home-made enema preparations have been used throughout the years, such as soapsuds, hydrogen peroxide, and household detergents. These can be  extremely irritating to the lining of the intestine and should be avoided.  BIOFEEDBACK FOR CONSTIPATION   Biofeedback is a behavioral approach that may help some people with severe chronic constipation who involuntarily squeeze (rather than relax) their muscles while having a bowel movement.  WHERE TO GET MORE INFORMATION   Your health care provider is the best source of information for questions and concerns related to your medical problem.  This article will be updated as needed on our website [email or web address]). Related topics for patients, as well as selected articles written for healthcare professionals, are also available. Some of the most relevant are listed below.  What is fiber?   This is a substance found in fruits, vegetables, nuts, and grains.  Most fiber passes through the body without being digested. But it can affect how you digest other foods. Fiber helps make bowel movements softer and more regular.  Why do I need a low-fiber diet?   A low-fiber diet contains foods that are easy to digest. These foods have less residue, meaning less undigested material passes through your system. They also put less pressure on your intestines. When you are on a low-fiber diet, your body makes fewer bowel movements.  Your doctor might recommend you eat a low-fiber diet for a short time if you have:  ?A flare-up of diverticulitis or inflammatory bowel disease  ?Gastroparesis (when the stomach empties more slowly than normal)  ?Short periods of bowel cramping or diarrhea  ?Narrowing of  your intestine  ?Had certain kinds of surgery (for example, hemorrhoidectomy, colostomy, ileostomy, or surgery in the belly area)  You should only eat a low-fiber diet for a short time until your symptoms improve. Then, once you can increase your fiber, slowly add foods back into your diet 1 at a time.  When you eat a low-fiber diet, you might also need extra nutrients. Talk with your doctor or nurse about adding vitamin supplements to your diet.  What can I eat and drink on a low-fiber diet?   Choose foods with low amounts of fiber. Some guidelines and examples:  ?Grains - Eat breads and grains with less than 2 grams of fiber per serving. These include foods like white bread without seeds, white flour or corn tortillas, well-cooked white rice or white pasta, refined white flour crackers, cream of wheat and rice, fine-ground grits, and cereal made from white flour like puffed rice and corn flakes.  ?Dairy products - Eat foods like milk, cheese, kefir, ice cream, and sherbet. Plain or flavored yogurt is good to eat as long as there is no fruit, nuts, seeds, or granola added to it. You can also have non-dairy and lactose-free milk products.  ?Meats and proteins - Eat tender, well-cooked meats such as beef, pork, lamb, [place], chicken, and fish. Eat smooth nut and seed butters like almond, peanut, and sunflower. You can also eat eggs and tofu.  ?Fruits and vegetables - Fruits and vegetables are good to eat as long as you do not eat the seeds, skins, or membranes. Fruits should be soft, canned, or well cooked. Examples include canned peaches and pears, and applesauce. You can also drink fruit and vegetable juices without pulp. Vegetables should also be canned or well cooked. Examples include cooked carrots, green beans, and potatoes with skin.  What foods and drinks should I avoid on a low-fiber diet?   It is important to avoid or limit certain foods. Some guidelines and examples:  ?Grains to avoid - These include  whole-wheat and whole-grain breads, whole-grain tortillas, whole-wheat pasta, brown rice, quinoa, kasha, barley, and popcorn. This also includes high-fiber cereals such as oatmeal, bran flakes, and shredded wheat.  ?Dairy products to avoid - Avoid yogurts and ice cream with added fruit, nuts, or granola.  ?Meats and proteins to avoid - Avoid meat, poultry, and fish that is fried or fatty or has gristle. Avoid processed meats like sausage, bacon, and hotdogs, as well as luncheon meats like bologna and salami. Avoid chunky nut butters, hummus, sushi, and seafood with a tough or rubbery texture like shrimp and scallops. Avoid dried peas, beans, and lentils.  ?Fruits and vegetables to avoid - Avoid raw, dried, or undercooked fruits and vegetables. Avoid canned pineapple, papaya, and mandarin oranges. Avoid alfalfa, bean sprouts, and vegetable juices with pulp.  What else should I know?   When you add fiber back into your diet:  ?Check with your doctor first.  ?Add fiber back into your diet slowly. Start by adding fiber to 1 of your meals each day.  ?Let your body adjust before you add more fiber to your diet.  ?Drink plenty of water .      Buy magnesium  citrate - (comes in clear bottle); drink the entire bottle and if you do not experience significant results, repeat again the next day  Will need to stay close to the bathroom  Drink plenty of water , avoid caffeine     Start Miralax  one capful daily (EVERYDAY), take everyday forever - you must drink plenty of water  on a daily basis - work on drinking 6-8 (8 ounce glasses daily)         Referral to GI today, they will call you to schedule

## 2024-07-26 NOTE — Progress Notes (Unsigned)
 INTERNAL MEDICINE   Medical Group Practice at [facility]  Acute Visit Progress Note     Name: Rachel [name] Date of Service: 07/27/2024   MRN:  [id number]  Age/DOB: 83 y.o., 02/23/1941 PCP:  Benedict Marble, MD  Reason for Visit: No chief complaint on file.***     Subjective:      Rachel [name] is a 83 y.o. female with PMH of  chronic pain, depression, GERD, low back pain, memory changes, OA, migraines, and SOB   who presents to the clinic for   complaints of LLQ abdominal pain.     Rachel [name] was last seen in clinic on 07/24/2024 for similar complaints.  A KUB was ordered, completed, but results are not yet resulted.   She also complained of urinary symptoms - specifically frequency that was disrupting her sleep.  No s/s of dysuria or hematuria.   Also, complained of worsening GERD symptoms despite taking dexilant  daily.  Reported feeling worsening reflux symptoms when lying down.  Endorses PMH of gastroparesis that was diagnosed in [place]   greater than three years ago. Consumes a bland diet, and cannot tolerate spicy foods.  Was also taking Mylanta  for symptoms relief.      Medical History:      Past Medical History, Allergies, Surgical History, Family, and Social History were reviewed  on   07/26/2024 and updated as needed.     MEDICATIONS:  No outpatient medications have been marked as taking for the 07/27/24 encounter (Appointment) with Almarie Landry Caldron, APRN,NP-C.       Review of Systems (Bold is Positive):      Constitutional - fevers, chills, weight loss, malaise, fatigue, sweats  HEENT - visual disturbance, eye redness, hearing loss, earaches, nasal congestion, sore throat, dysphagia  Respiratory -  cough, sputum, hemoptysis, shortness of breath  Cardiovascular - chest pain, orthopnea, lower extremity edema, palpitations  Gastrointestinal -  nausea, vomiting, diarrhea, constipation, blood in stool, abdominal pain  Genitourinary - dysuria, polyuria, urgency, hematuria  Integument- rashes, lesions, pruritis  Musculoskeletal  - myalgias, arthralgias  Neurological - headaches, lightheadedness, weakness  Behavioral/Psych - anxiety, depressed mood  Endocrine - polydipsia, temperature intolerance     Physical Exam:     Vitals:   vitals were not taken for this visit.      General:  Well-nourished, well-developed, in no apparent distress  Head:  Normocephalic, atraumatic  Ears:  Tympanic membranes pearly without erythema or edema bilaterally  Eyes:  Pupils equally round and reactive to light, extraocular muscles intact, sclareae non-icteric, conjunctivae clear  Nose:  Turbinates without inflammation or erythema  Throat:  Oropharynx clear, mucous membranes moist  Neck:  Trachea midline, no thyromegaly or lymphadenopathy  Cardiovascular:  Regular rate and rhythm, no murmurs, rubs, gallops  Respiratory:  Clear to auscultation bilaterally, no wheezes, rhonchi, crackles  Abdominal:  Bowel sounds normal; abdomen soft, non-tender to palpation  Extremities:  No edema, dorsalis pedis pulses 2+ bilaterally  Skin:  Warm and dry  Neurological:  Awake, A&O x 3. CNII-XII, strength and sensation grossly intact  Psychiatric:  Normal mood & affect, thought process linear, speech content normal       Data Reviewed and Interpretation:     KUB completed 07/24/2024 for similar complaints - results pending      07/24/24 1400    Urine Test   Performed Status: Manual   Time collected 1423   Manufacturer Siemens   Urine Test - Siemens   Color (Ref Range: Yellow) Yellow  Clarity (Ref Range: Clear) Clear   Glucose (Ref Range: Negative mg/dL) Negative   Bilirubin (Ref Range: Negative mg/dL) (!) 3+   Ketones (Ref Range: Negative mg/dL) Negative   Urine Specific Gravity (Ref Range: 1.005 - 1.030) 1.020   Blood (urine) (Ref Range: Negative mg/dL) Negative   pH (Ref Range: 5.0 - 8.0) 5.0   Protein (Ref Range: Negative mg/dL) (!) 1+ (30mg /dL)   Urobilinogen (Ref Range: Normal) Normal    Nitrite (Ref Range: Negative) Negative   Leukocytes (Ref Range: Negative WBC's/uL) Trace    Bottle Number (Siemens Multistix 10 SG) 17   Lot # [id number]   Expiration Date 05/23/25   Initials BW            Assessment:      Rachel [name] is a 83 y.o. female with PMH of  of  chronic pain, depression, GERD, low back pain, memory changes, OA, migraines, and SOB   who presents to the clinic for   complaints of LLQ abdominal pain.        Plan:              Follow Up:       Return to clinic in ***  Labs and Imaging Needing Follow Up:  ***  Goals for Next Visit:  ***    Patient had no questions regarding treatment plan, goals, risks or benefits and agrees to contact me or my clinic in the interim should any questions or problems arise.    Bess Saltzman Jenkins Mon, DNP, FNP-C  Medicare Wellness Provider  INTERNAL MEDICINE, [facility]  Operated by [facility]  [address]  [place] [address]  Dept: [phone]  Dept Fax: [phone]         On the day of the encounter, a total of  *** minutes was spent on this patient encounter including review of historical information, examination, documentation and post-visit activities.

## 2024-07-27 ENCOUNTER — Encounter (HOSPITAL_BASED_OUTPATIENT_CLINIC_OR_DEPARTMENT_OTHER): Payer: Self-pay | Admitting: Family

## 2024-07-27 ENCOUNTER — Ambulatory Visit (HOSPITAL_BASED_OUTPATIENT_CLINIC_OR_DEPARTMENT_OTHER): Payer: Self-pay | Admitting: Student in an Organized Health Care Education/Training Program

## 2024-07-27 ENCOUNTER — Ambulatory Visit (HOSPITAL_BASED_OUTPATIENT_CLINIC_OR_DEPARTMENT_OTHER): Payer: Self-pay | Admitting: PHYSICIAN ASSISTANT

## 2024-07-27 ENCOUNTER — Other Ambulatory Visit: Payer: Self-pay

## 2024-07-27 ENCOUNTER — Ambulatory Visit: Attending: Family | Admitting: Family

## 2024-07-27 VITALS — BP 122/70 | HR 92 | Temp 96.8°F | Ht 63.0 in | Wt 115.3 lb

## 2024-07-27 DIAGNOSIS — R1032 Left lower quadrant pain: Secondary | ICD-10-CM

## 2024-07-27 DIAGNOSIS — R109 Unspecified abdominal pain: Secondary | ICD-10-CM | POA: Insufficient documentation

## 2024-07-27 DIAGNOSIS — R140 Abdominal distension (gaseous): Secondary | ICD-10-CM

## 2024-07-27 DIAGNOSIS — K5909 Other constipation: Secondary | ICD-10-CM | POA: Insufficient documentation

## 2024-07-27 NOTE — Telephone Encounter (Signed)
 Medical Group Practice  [address]  [place], [place] [address]       PATIENT NAME: Rachel [name]  MRN: [id number]  DOB: [birth year]        Date: 07/27/2024  Time: 09:08      Attempted to call patient. Left message with call back number.      Rico Merry, MSN, RN  Clinical Nurse Coordinator

## 2024-07-27 NOTE — Telephone Encounter (Signed)
 Regarding: Returning Call  ----- Message from Haivana Nakya B sent at 07/24/2024  4:03 PM EDT -----  Copied From CRM #[id number].[name], Rachel    [name], Rachel A (Self) is returning a call they received from the staff pt calling stating she missed a call from rachel Please call pt to advise.Thank you!

## 2024-07-27 NOTE — Telephone Encounter (Signed)
 Medical Group Practice  [address]  [place], [place] [address]       PATIENT NAME: Rachel [name]  MRN: [id number]  DOB: August 10, 1941        Date: 07/27/2024  Time: 14:57      Patient called into the triage line stating that she has missed calls from Rachael. She said they did not leave a message, it is on her caller ID and she thought it was the doctors office. While on the phone she wanted to make sure she still has an appointment today, this was confirmed for day and time. No other questions or concerns.     Rico Merry, MSN, RN  Clinical Nurse Coordinator

## 2024-07-28 ENCOUNTER — Encounter (INDEPENDENT_AMBULATORY_CARE_PROVIDER_SITE_OTHER): Payer: Self-pay

## 2024-07-29 ENCOUNTER — Ambulatory Visit (HOSPITAL_BASED_OUTPATIENT_CLINIC_OR_DEPARTMENT_OTHER): Payer: Self-pay | Admitting: Student in an Organized Health Care Education/Training Program

## 2024-07-31 ENCOUNTER — Encounter (INDEPENDENT_AMBULATORY_CARE_PROVIDER_SITE_OTHER): Payer: Self-pay

## 2024-08-03 ENCOUNTER — Encounter (HOSPITAL_BASED_OUTPATIENT_CLINIC_OR_DEPARTMENT_OTHER): Payer: Self-pay | Admitting: Student in an Organized Health Care Education/Training Program

## 2024-08-03 ENCOUNTER — Ambulatory Visit (HOSPITAL_BASED_OUTPATIENT_CLINIC_OR_DEPARTMENT_OTHER)

## 2024-08-03 ENCOUNTER — Ambulatory Visit
Payer: Self-pay | Attending: Student in an Organized Health Care Education/Training Program | Admitting: Student in an Organized Health Care Education/Training Program

## 2024-08-03 ENCOUNTER — Other Ambulatory Visit: Payer: Self-pay

## 2024-08-03 VITALS — BP 114/68 | HR 81 | Temp 96.4°F | Ht 63.7 in | Wt 112.0 lb

## 2024-08-03 DIAGNOSIS — R42 Dizziness and giddiness: Secondary | ICD-10-CM | POA: Insufficient documentation

## 2024-08-03 DIAGNOSIS — Z9181 History of falling: Secondary | ICD-10-CM | POA: Insufficient documentation

## 2024-08-03 DIAGNOSIS — K3184 Gastroparesis: Secondary | ICD-10-CM | POA: Insufficient documentation

## 2024-08-03 DIAGNOSIS — R634 Abnormal weight loss: Secondary | ICD-10-CM

## 2024-08-03 DIAGNOSIS — T148XXA Other injury of unspecified body region, initial encounter: Secondary | ICD-10-CM | POA: Insufficient documentation

## 2024-08-03 DIAGNOSIS — K219 Gastro-esophageal reflux disease without esophagitis: Secondary | ICD-10-CM | POA: Insufficient documentation

## 2024-08-03 DIAGNOSIS — F39 Unspecified mood [affective] disorder: Secondary | ICD-10-CM | POA: Insufficient documentation

## 2024-08-03 LAB — COMPREHENSIVE METABOLIC PANEL, NON-FASTING
ALBUMIN: 4.3 g/dL (ref 3.4–4.8)
ALKALINE PHOSPHATASE: 48 U/L — ABNORMAL LOW (ref 55–145)
ALT (SGPT): 13 U/L (ref <31)
ANION GAP: 8 mmol/L (ref 4–13)
AST (SGOT): 22 U/L (ref 11–34)
BILIRUBIN TOTAL: 0.5 mg/dL (ref 0.3–1.3)
BUN/CREA RATIO: 17 (ref 6–22)
BUN: 15 mg/dL (ref 8–25)
CALCIUM: 9 mg/dL (ref 8.6–10.3)
CHLORIDE: 108 mmol/L (ref 96–111)
CO2 TOTAL: 26 mmol/L (ref 23–31)
CREATININE: 0.86 mg/dL (ref 0.60–1.05)
ESTIMATED GFR - FEMALE: 67 mL/min/BSA (ref >=60)
GLUCOSE: 94 mg/dL (ref 65–125)
POTASSIUM: 4.8 mmol/L (ref 3.7–5.3)
PROTEIN TOTAL: 6.9 g/dL (ref 5.6–7.6)
SODIUM: 142 mmol/L (ref 136–145)

## 2024-08-03 LAB — CBC WITH DIFF
BASOPHIL #: <0.1 x10ˆ3/uL (ref <=0.20)
BASOPHIL %: 0.7 %
EOSINOPHIL #: 0.16 x10ˆ3/uL (ref <=0.50)
EOSINOPHIL %: 3.7 %
HCT: 34.5 % — ABNORMAL LOW (ref 34.8–46.0)
HGB: 11 g/dL — ABNORMAL LOW (ref 11.5–16.0)
IMMATURE GRANULOCYTE #: <0.1 x10ˆ3/uL (ref <0.10)
IMMATURE GRANULOCYTE %: 0.2 % (ref 0.0–1.0)
LYMPHOCYTE #: 0.88 x10ˆ3/uL — ABNORMAL LOW (ref 1.00–4.80)
LYMPHOCYTE %: 20.5 %
MCH: 30.2 pg (ref 26.0–32.0)
MCHC: 31.9 g/dL (ref 31.0–35.5)
MCV: 94.8 fL (ref 78.0–100.0)
MONOCYTE #: 0.38 x10ˆ3/uL (ref 0.20–1.10)
MONOCYTE %: 8.9 %
MPV: 9.9 fL (ref 8.7–12.5)
NEUTROPHIL #: 2.83 x10ˆ3/uL (ref 1.50–7.70)
NEUTROPHIL %: 66 %
PLATELETS: 204 x10ˆ3/uL (ref 150–400)
RBC: 3.64 x10ˆ6/uL — ABNORMAL LOW (ref 3.85–5.22)
RDW-CV: 13.6 % (ref 11.5–15.5)
WBC: 4.3 x10ˆ3/uL (ref 3.7–11.0)

## 2024-08-03 NOTE — Progress Notes (Signed)
 INTERNAL MEDICINE   Medical Group Practice at [facility]  Return Visit Progress Note     Name: Rachel [name] Date of Service: 08/03/2024   MRN:  [id number]  Age/DOB: 83 y.o.1941-09-19   PCP:  Benedict Marble, MD       This note was created with assistance from [employer] via capture of conversational audio.  Consent was obtained from the patient prior to recording.     CHIEF COMPLAINT:   Annual Wellness Exam      SUBJECTIVE:  History of Present Illness  Rachel [name] is an 83 year old female who presents with ongoing gastrointestinal symptoms and bruising. She was driven to the appointment by her husband.    She experiences ongoing nausea and difficulty eating, leading to a weight loss of approximately nine pounds over the past six to eight months. She finds that 'nothing sounds good' to eat, which contributes to her weight loss. She uses Pepcidexilant and occasionally Mylanta, which she feels helps when she anticipates a long day. A gastric emptying study was ordered, and she was referred to a gastroenterologist, but she has not scheduled these appointments due to communication issues.    She experiences significant bruising on her legs without any clear history of trauma. She also notes a vein on her leg that is 'really starting to pop out' and is sore.    She describes an episode of severe dizziness and disorientation while walking her dog, which she attributes to dehydration as she had not eaten or drunk anything that morning. She was assisted by a neighbor during this episode.    Her diet is significantly limited due to nausea, and she struggles to find foods she can tolerate. She can manage saltine crackers, soup (excluding tomato), and vanilla yogurt with blueberries. She drinks Ensure daily and sometimes twice a day to supplement her nutrition.    She expresses significant stress related to her living situation and family dynamics. Her children want her to move to [facility] to be closer to them, but her husband is  unwilling to move. She feels 'stuck' and is experiencing tension with her children over this issue. She also describes a restrictive lifestyle imposed by her husband, including limitations on grocery shopping and financial independence.    She has a history of constipation, exacerbated by long-term oxycodone  use. She uses stool softeners and Miralax  to manage this, though she notes her Miralax  supply is low.    OBJECTIVE:  Current Outpatient Medications   Medication Sig    azelastine  (ASTELIN ) 137 mcg (0.1 %) Nasal Spray, Non-Aerosol Administer 1 Spray into each nostril Twice daily for 180 days Use in each nostril as directed Indications: seasonal runny nose    Dexlansoprazole  (DEXILANT ) 60 mg Oral Cap, Delayed Rel., Multiphasic Take 1 Capsule (60 mg total) by mouth Daily Indications: gastroesophageal reflux disease, heartburn    famotidine  (PEPCID ) 20 mg Oral Tablet Take 1 Tablet (20 mg total) by mouth Twice daily    fexofenadine  (ALLEGRA  ALLERGY) 180 mg Oral Tablet Take 1 Tablet (180 mg total) by mouth Daily for 180 days Indications: inflammation of the nose due to an allergy    fluticasone  propionate (FLONASE ) 50 mcg/actuation Nasal Spray, Suspension Administer 1 Spray into each nostril Once a day    fluticasone  propionate (FLONASE ) 50 mcg/actuation Nasal Spray, Suspension Administer 1 Spray into each nostril Daily    ondansetron  (ZOFRAN  ODT) 4 mg Oral Tablet, Rapid Dissolve Take 1 Tablet (4 mg total) by mouth Every 8 hours as needed  for Nausea/Vomiting for up to 30 days    Oxycodone  (ROXICODONE ) 10 mg Oral Tablet Take 1 Tablet (10 mg total) by mouth Every 6 hours as needed for Pain for up to 30 days Indications: pain, osteoarthritis    solifenacin  (VESICARE ) 10 mg Oral Tablet Take 1 Tablet (10 mg total) by mouth Daily     Past Medical History:   Diagnosis Date    Chronic pain     Depression     GERD (gastroesophageal reflux disease)     Low back pain     Memory changes     Migraine     Neck problem      Osteoarthritis     Shortness of breath     Wears glasses          Past Surgical History:   Procedure Laterality Date    ANTERIOR FUSION CERVICAL SPINE  12/30/2020    BREAST IMPLANT REMOVAL Bilateral     2022    CYSTOURETHROSCOPY      FOOT SURGERY Bilateral     HX BREAST AUGMENTATION Bilateral     x3, 1974, 1994, 2014    HX CATARACT REMOVAL Bilateral 01/12/2021    HX CERVICAL SPINE SURGERY      HX ROTATOR CUFF REPAIR Right     SACROILIAC JOINT INJECTION      TOE AMPUTATION           BP 114/68 (Site: Left Arm, Patient Position: Sitting, Cuff Size: Adult)   Pulse 81   Temp (!) 35.8 C (96.4 F) (Temporal)   Ht 1.618 m (5' 3.7)   Wt 50.8 kg (111 lb 15.9 oz)   SpO2 98%   BMI 19.40 kg/m       Physical Exam  MEASUREMENTS: Weight- 111 lbs.  EXTREMITIES: Bruising on left shin more than right shin; right leg with bruises on backside. Prominent, sore vein.  Appearance: slow gait, small Yust, elderly  RESULTS:  Results  Endoscopy: Normal esophagus, normal stomach, normal duodenum, small hiatal hernia (12/2021)  Last CBC  (Last result in the past 2 years)        WBC   HGB   HCT   MCV   Platelets      12/31/22 1205 3.2   10.1   30.3   91.3   206            Last BMP  (Last result in the past 2 years)        Na   K   Cl   CO2   BUN   Cr   Calcium   Glucose   Glucose-Fasting        04/24/24 2225             8.9  Comment: Gadolinium-containing contrast can interfere with calcium measurement.                   Lab Results   Component Value Date    TSH 0.642 04/24/2024     Lab Results   Component Value Date    HA1C 5.4 03/03/2024     Lab Results   Component Value Date    TRIG 167 (H) 09/13/2021    HDLCHOL 72 09/13/2021    LDLCHOL 125 (H) 09/13/2021    CHOLESTEROL 230 (H) 09/13/2021       ASSESSMENT/PLAN:  Assessment & Plan  Nausea with unintentional weight loss  Chronic nausea with significant weight loss. Differential includes gastrointestinal issues or stress-related causes.  Previous endoscopy normal except for small hiatal  hernia. Further evaluation needed.  - Refer to gastroenterology for further evaluation.  - Schedule gastric emptying study.  - Encourage increased caloric intake with small frequent meals, including saltine crackers, soup, and yogurt.  - Increase Ensure intake to two per day.    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 Large stool burden noted on imaging.  - Continue stool softeners (docusate).  - Increase Miralax  to one tablespoon daily.  - Ensure adequate hydration.    Gastroesophageal reflux disease  GERD managed with Dexlansoprazole . Mylanta provides relief as needed.  - Continue Dexlansoprazole  as prescribed and Pepcid  daily.  - Use Mylanta as needed.  -Call to schedule nuc gastric emptying study and GI referral  -Reviewed last endoscopy from 2023    Easy bruising and varicose veins, bilateral lower extremities  Easy bruising and prominent varicose veins noted. Possible underlying hematological issue.  - Order blood counts to assess for anemia or other hematological abnormalities.  - Evaluate liver function tests.    Dizziness and near-syncope, likely due to dehydration  Dizziness and near-syncope likely due to dehydration.  - Encourage adequate hydration with at least four small bottles of water  daily.  - Advise regular small snacks to maintain blood sugar levels.    Insomnia  Insomnia possibly related to stress and anxiety.    Depression and anxiety symptoms  Symptoms related to personal life stressors.  Continue to monitor    Benedict Marble, MD  Assistant Professor of Internal Medicine  [facility]  [facility]

## 2024-08-03 NOTE — Patient Instructions (Addendum)
 VISIT SUMMARY:  During today's visit, we discussed your ongoing gastrointestinal symptoms, bruising, and other health concerns. We reviewed your current medications and lifestyle, and I provided recommendations to help manage your symptoms and improve your overall well-being.    YOUR PLAN:  -NAUSEA WITH UNINTENTIONAL WEIGHT LOSS: You have been experiencing chronic nausea and significant weight loss, which may be due to gastrointestinal issues or stress. We need further evaluation to determine the cause. Please schedule an appointment with a gastroenterologist by aclling [phone]  and a gastric emptying study by calling [phone]. In the meantime, try to increase your caloric intake with small, frequent meals, including saltine crackers, soup, and yogurt. Also, increase your Ensure intake to two per day.    -CONSTIPATION DUE TO CHRONIC OPIOID USE: Your constipation is likely due to long-term use of oxycodone . Continue taking stool softeners and increase your Miralax  to one tablespoon daily. Make sure to stay well-hydrated.    -GASTROESOPHAGEAL REFLUX DISEASE (GERD): GERD is a condition where stomach acid frequently flows back into the tube connecting your mouth and stomach. Continue taking Dexlansoprazole  as prescribed and use Mylanta as needed for relief.    -EASY BRUISING AND VARICOSE VEINS, BILATERAL LOWER EXTREMITIES: You have been experiencing easy bruising and prominent varicose veins. We need to check your blood counts and liver function to rule out any underlying issues. I have ordered these tests for you.    -DIZZINESS AND NEAR-SYNCOPE, LIKELY DUE TO DEHYDRATION: Your dizziness and near-fainting episode were likely due to dehydration. Make sure to drink at least four small bottles of water  daily and have regular small snacks to maintain your blood sugar levels.    -INSOMNIA: Your difficulty sleeping may be related to stress and anxiety. We will monitor this and discuss further if  needed.    -DEPRESSION AND ANXIETY SYMPTOMS: Your feelings of depression and anxiety are likely related to personal life stressors. Consider speaking with a Child psychotherapist for support and think about the possibility of moving to [facility] for better social support.    INSTRUCTIONS:  Please schedule an appointment with a gastroenterologist and a gastric emptying study as soon as possible. Follow the dietary and hydration recommendations provided. We will also need to perform blood counts and liver function tests to investigate your bruising and varicose veins. Consider speaking with a Child psychotherapist for additional support regarding your stress and anxiety. Follow up with me after completing these steps or if your symptoms worsen.

## 2024-08-05 ENCOUNTER — Ambulatory Visit (HOSPITAL_BASED_OUTPATIENT_CLINIC_OR_DEPARTMENT_OTHER): Payer: Self-pay | Admitting: Student in an Organized Health Care Education/Training Program

## 2024-08-06 ENCOUNTER — Other Ambulatory Visit (HOSPITAL_BASED_OUTPATIENT_CLINIC_OR_DEPARTMENT_OTHER): Payer: Self-pay | Admitting: PHYSICIAN ASSISTANT

## 2024-08-06 ENCOUNTER — Encounter (INDEPENDENT_AMBULATORY_CARE_PROVIDER_SITE_OTHER): Payer: Self-pay

## 2024-08-06 ENCOUNTER — Other Ambulatory Visit (HOSPITAL_BASED_OUTPATIENT_CLINIC_OR_DEPARTMENT_OTHER): Payer: Self-pay | Admitting: Student in an Organized Health Care Education/Training Program

## 2024-08-06 DIAGNOSIS — K219 Gastro-esophageal reflux disease without esophagitis: Secondary | ICD-10-CM

## 2024-08-06 DIAGNOSIS — M1990 Unspecified osteoarthritis, unspecified site: Secondary | ICD-10-CM

## 2024-08-06 DIAGNOSIS — R110 Nausea: Secondary | ICD-10-CM

## 2024-08-07 MED ORDER — OXYCODONE 10 MG TABLET
10.0000 mg | ORAL_TABLET | Freq: Four times a day (QID) | ORAL | 0 refills | Status: DC | PRN
Start: 2024-08-07 — End: 2024-09-15

## 2024-08-10 ENCOUNTER — Other Ambulatory Visit (HOSPITAL_BASED_OUTPATIENT_CLINIC_OR_DEPARTMENT_OTHER): Payer: Self-pay | Admitting: Student in an Organized Health Care Education/Training Program

## 2024-08-10 DIAGNOSIS — M1990 Unspecified osteoarthritis, unspecified site: Secondary | ICD-10-CM

## 2024-08-10 NOTE — Telephone Encounter (Signed)
 Last scheduled appointment with you was 08/03/2024.  Currently scheduled next future appointment with you is 11/04/2024  The next office visit in this department is 08/18/2024    Damien Chestnut, Ambulatory Care Assistant  08/10/2024, 11:52    Possible duplication refuse if agree

## 2024-08-11 NOTE — Telephone Encounter (Deleted)
 Regarding: question on medciation dose  ----- Message from Arland HERO sent at 08/11/2024  2:12 PM EDT -----  Copied From CRM #[id number].[facility] () called with a clinical question.   Oxycodone  (ROXICODONE ) 10 mg Oral Tablet 120 Tablet 0 08/07/2024 09/06/2024   Sig - Route: Take 1 Tablet (10 mg total) by mouth Every 6 hours as needed for Pain for up to 30 days Indications: pain, osteoarthritis - Oral   Sent to pharmacy as: oxyCODONE  10 mg tablet (ROXICODONE )   Class: E-Rx      Calling in over dose being over the limit for safety , will be faxing paperwork over

## 2024-08-11 NOTE — Telephone Encounter (Signed)
 Medical Group Practice  [address]  [place], [place] [address]       PATIENT NAME: Rachel [name]  MRN: [id number]  DOB: Nov 06, 1941        Date: 08/11/2024  Time: 14:18    Opened in error      Rico Merry, MSN, RN  Clinical Nurse Coordinator

## 2024-08-11 NOTE — Telephone Encounter (Signed)
 Medical Group Practice  [address]  [place], [place] [address]       PATIENT NAME: Rachel [name]  MRN: [id number]  DOB: [birth year]        Date: 08/11/2024  Time: 14:21      Attempted to call patient. Left detailed message that the prescription has been sent to pharmacy. Left call back number.      Rico Merry, MSN, RN  Clinical Nurse Coordinator

## 2024-08-14 ENCOUNTER — Other Ambulatory Visit (HOSPITAL_BASED_OUTPATIENT_CLINIC_OR_DEPARTMENT_OTHER): Payer: Self-pay | Admitting: Student in an Organized Health Care Education/Training Program

## 2024-08-14 ENCOUNTER — Encounter (HOSPITAL_BASED_OUTPATIENT_CLINIC_OR_DEPARTMENT_OTHER): Payer: Self-pay

## 2024-08-14 MED ORDER — LACTOBACILLUS ACIDOPHILUS 75 MILLION CELL-PECTIN 100 MG CAPSULE
1.0000 | ORAL_CAPSULE | Freq: Every day | ORAL | 1 refills | Status: AC
Start: 2024-08-14 — End: ?

## 2024-08-14 NOTE — Telephone Encounter (Signed)
 Medical Group Practice  [address]  [place], [place] [address]       PATIENT NAME: Rachel [name]  MRN: [id number]  DOB: 1941/07/26        Date: 08/14/2024  Time: 12:42      Last scheduled appointment with you was 08/03/2024.  Currently scheduled next future appointment with you is 11/04/2024  The next office visit in this department is 08/18/2024    Order pended      Rico Merry, MSN, RN  Clinical Nurse Coordinator

## 2024-08-14 NOTE — Progress Notes (Deleted)
 INTERNAL MEDICINE, [facility]  [address]  [place] [address]  Operated by [facility]  Medicare Annual Wellness Visit    Name: Rachel [name] MRN:  [id number]   Date: 08/18/2024 Age: 83 y.o.       SUBJECTIVE:   Rachel [name] is a 83 y.o. female for presenting for Medicare Wellness exam.   I have reviewed and reconciled the medication list with the patient today.    Comprehensive Health Assessment:  Paper document COMPREHENSIVE HEALTH ASSESSMENT reviewed and scanned into medical record    I have reviewed and updated as appropriate the past medical, family and social history. 08/18/2024 as summarized below:  Past Medical History:   Diagnosis Date    Chronic pain     Depression     GERD (gastroesophageal reflux disease)     Low back pain     Memory changes     Migraine     Neck problem     Osteoarthritis     Shortness of breath     Wears glasses      Past Surgical History:   Procedure Laterality Date    Anterior fusion cervical spine  12/30/2020    Breast implant removal Bilateral     Cystourethroscopy      Foot surgery Bilateral     Hx breast augmentation Bilateral     Hx cataract removal Bilateral 01/12/2021    Hx cervical spine surgery      Hx rotator cuff repair Right     Sacroiliac joint injection      Toe amputation       Current Outpatient Medications   Medication Sig    azelastine  (ASTELIN ) 137 mcg (0.1 %) Nasal Spray, Non-Aerosol Administer 1 Spray into each nostril Twice daily for 180 days Use in each nostril as directed Indications: seasonal runny nose    Dexlansoprazole  (DEXILANT ) 60 mg Oral Cap, Delayed Rel., Multiphasic Take 1 Capsule (60 mg total) by mouth Daily Indications: gastroesophageal reflux disease, heartburn    famotidine  (PEPCID ) 20 mg Oral Tablet Take 1 Tablet (20 mg total) by mouth Twice daily    fexofenadine  (ALLEGRA  ALLERGY) 180 mg Oral Tablet Take 1 Tablet (180 mg total) by mouth Daily for 180 days Indications: inflammation of the nose due to  an allergy    fluticasone  propionate (FLONASE ) 50 mcg/actuation Nasal Spray, Suspension Administer 1 Spray into each nostril Once a day    fluticasone  propionate (FLONASE ) 50 mcg/actuation Nasal Spray, Suspension Administer 1 Spray into each nostril Daily    ondansetron  (ZOFRAN  ODT) 4 mg Oral Tablet, Rapid Dissolve Take 1 Tablet (4 mg total) by mouth Every 8 hours as needed for Nausea/Vomiting for up to 30 days    Oxycodone  (ROXICODONE ) 10 mg Oral Tablet Take 1 Tablet (10 mg total) by mouth Every 6 hours as needed for Pain for up to 30 days Indications: pain, osteoarthritis    solifenacin  (VESICARE ) 10 mg Oral Tablet Take 1 Tablet (10 mg total) by mouth Daily     Family Medical History:       Problem Relation (Age of Onset)    Arthritis-osteo Sister    Food Allergy Mother, Sister    No Known Problems Father, Maternal Grandfather, Paternal Grandmother, Paternal Grandfather, Daughter    Pneumonia Mother, Maternal Grandmother    Psoriasis Daughter            Social History     Socioeconomic History    Marital status:  Married     Spouse name: Radio producer    Number of children: 2   Occupational History    Occupation: Retired   Tobacco Use    Smoking status: Never    Smokeless tobacco: Never   Vaping Use    Vaping status: Never Used   Substance and Sexual Activity    Alcohol use: Not Currently    Drug use: Not Currently     Frequency: 5.0 times per week     Types: Marijuana     Comment: medical marijuana gel    Sexual activity: Not Currently     Partners: Male   Social History Narrative    Right handed; married for 47 years; two daughters (fraternal twins).  Two grandson's and one adopted grandson.      Originally from [place], [place].  Previously lived in [place] years ([place]) prior to coming back to [place] in late 2022.     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         Twins are to her first husband (was married for 3 years).         Enjoyed  needlepoint, knitting, golf, tennis, gardening, cooking (does not do any of them now)        Daughter's both live in [place], [place].          Social Determinants of Health     Health Literacy: Medium Risk (09/11/2023)    Health Literacy     SDOH Health Literacy: Sometimes         List of Current Health Care Providers   Care Team       PCP       Name Type Specialty Phone Number    Edgardo Pointer, MD Physician INTERNAL MEDICINE [phone]              Care Team       Name Type Specialty Phone Number    Almarie Landry Caldron, [facility] Nurse Practitioner INTERNAL MEDICINE [phone]    Marian Kaska, [place] Physician PODIATRIST-GENERAL PRACTICE [phone]    Constantine Collar, [place] Physician PODIATRIST-GENERAL PRACTICE [phone]    Rod Norleen LABOR, MD FACS Physician VASCULAR SURGERY [phone]    Zachary Becker, MD Physician OPHTHALMOLOGY [phone]    Leonore Hoy LABOR, RDLD Dietitian DIETICIAN-REGISTERED [phone]    Issac [place], MD Physician NEUROLOGICAL SURGERY [phone]    Donel Nian, PsyD Psychologist CLINICAL NEUROPSYCHOLOGIST [phone]    Victory Setter, PT Physical Therapist PHYSICAL THERAPY [phone]    [employer] Not available OPTOMETRIST Not available                      Health Maintenance   Topic Date Due    RSV Adult 60+ or Pregnancy (1 - 1-dose 75+ series) Never done    Medicare Annual Wellness Visit - Calendar Year Insurers  12/25/2023    Covid-19 Vaccine (7 - 2024-25 season) 05/07/2024    Influenza Vaccine (1) 08/24/2024    Depression Screening  03/05/2025    Adult Tdap-Td (3 - Td or Tdap) 04/24/2034    Shingles Vaccine  Completed    Pneumococcal Vaccination, Age 22+  Completed    Osteoporosis screening  Discontinued     Medicare Wellness Assessment      Advance Directives  Activities of Daily Living                   Cognitive Function Screen (1=Yes, 0=No)                                                   Fall Risk Screen       Depression Screen            Pain Score        Substance Use-Abuse Screening     Tobacco Use           Alcohol use           Prescription Drug Use                 Illicit Drug Use                     OBJECTIVE:   There were no vitals taken for this visit.     General: appears in stable health, stated age, no apparent distress  Mental Status: appears alert, awake, aware of self and environment, cooperative. Speech fluent; words clear. Oriented to person, place, time.  HEENT: skull appears normocephalic.   Thorax and Lungs: chest appears symmetric with good expansion; respirations regular, even, unlabored at rest.   Gait with normal base.   Psych: congruent mood and affect, communicative, thought process coherent and insight is good.   Skin: appears warm, dry.     Other appropriate exam:  Wt Readings from Last 10 Encounters:   08/03/24 50.8 kg (111 lb 15.9 oz)   07/27/24 52.3 kg (115 lb 4.8 oz)   07/24/24 52 kg (114 lb 10.2 oz)   05/25/24 53.1 kg (117 lb 1 oz)   05/13/24 53.8 kg (118 lb 9.7 oz)   04/24/24 55.3 kg (121 lb 14.6 oz)   04/24/24 55.3 kg (122 lb)   04/07/24 55.3 kg (122 lb)   03/26/24 55.5 kg (122 lb 5.7 oz)   03/05/24 55 kg (121 lb 4.1 oz)     There is no height or weight on file to calculate BMI.    Health Maintenance Due   Topic Date Due    RSV Adult 60+ or Pregnancy (1 - 1-dose 75+ series) Never done    [facility] Annual Wellness Visit - Calendar Year Insurers  12/25/2023    Covid-19 Vaccine (7 - 2024-25 season) 05/07/2024      ASSESSMENT & PLAN:  Problem List Items Addressed This Visit    None  Visit Diagnoses         Medicare annual wellness visit, subsequent    -  Primary             Identified Risk Factors/ Recommended Actions     (Z00.00) Medicare annual wellness visit, subsequent  (primary encounter diagnosis)  Plan: ***    Fall Risk Assessment     Fall Risk Follow up plan of care: {Fall Plan of Rjmz:7895285}       Urinary Incontinence Plan of Care: {urinary incontinence plan:2102662}         Opioid use plan of  care:         Opioids use: Plan: Assessment of pain is completed. Advised to follow up with opioid prescribing provider.          No orders of  the defined types were placed in this encounter.         The patient has been educated about risk factors and recommended preventive care. Written Prevention Plan completed/ updated and given to patient (see After Visit Summary).    No follow-ups on file.      Rheannon Cerney Jenkins Mon, DNP, FNP-C  Medicare Wellness Provider  INTERNAL MEDICINE, [place]  Operated by [facility]  [address]  [place] [address]  Dept: [phone]  Dept Fax: [phone]

## 2024-08-14 NOTE — Telephone Encounter (Signed)
-----   Message from Delon RAMAN, [place] COORDINATOR sent at 08/14/2024 11:47 AM EDT -----  Regarding: FW: Clinical Question  ----- Message from Bascom NOVAK sent at 08/14/2024 11:46 AM EDT -----  Copied From CRM #[id number].  Slivka, Amere A (Self) called with a clinical question.     Benedict Marble, MD    Pt is requesting a script be ordered for probiotics.  Pt says it's been helping with stomach pain and appetite. Please call pt to discuss.

## 2024-08-14 NOTE — Patient Instructions (Incomplete)
 Medicare Preventive Services  Medicare coverage information Recommendation for YOU   Heart Disease and Diabetes   Lipid profile Every 5 years or more often if at risk for cardiovascular disease     Lab Results   Component Value Date    CHOLESTEROL 230 (H) 09/13/2021    HDLCHOL 72 09/13/2021    LDLCHOL 125 (H) 09/13/2021    TRIG 167 (H) 09/13/2021         Diabetes Screening    Yearly for those at risk for diabetes, 2 tests per year for those with prediabetes Last Glucose:      Lab Results   Component Value Date    HA1C 5.4 03/03/2024        Diabetes Self Management Training or Medical Nutrition Therapy  For those with diabetes, up to 10 hrs initial training within a year, subsequent years up to 2 hrs of follow up training Optional for those with diabetes     Medical Nutrition Therapy  Three hours of one-on-one counseling in first year, two hours in subsequent years Optional for those with diabetes, kidney disease   Intensive Behavioral Therapy for Obesity  Face-to-face counseling, first month every week, month 2-6 every other week, month 7-12 every month if continued progress is documented Optional for those with Body Mass Index 30 or higher  Your There is no height or weight on file to calculate BMI.   Tobacco Cessation (Quitting) Counseling   Covers up to 8 smoking and tobacco-use cessation counseling sessions in a 47-month period.    Optional for those that use tobacco  N/A   Cancer Screening Last Completion Date   Colorectal screening   For anyone age 3 to 90 or any age if high risk:  Screening Colonoscopy every 10 yrs if low risk,  more frequent if higher risk  OR  Cologuard Stool DNA test once every 3 years OR  Fecal Occult Blood Testing yearly OR  Flexible  Sigmoidoscopy  every 5 yr OR  CT Colonography every 5 yrs  N/A    See below for due date if applicable.   Screening Pap Test   Recommended every 3 years for all women age 63 to 77, or every five years if combined with HPV test (routine screening not needed  after total hysterectomy).  Medicare covers every 2 years or yearly if high risk.  Screening Pelvic Exam   Medicare covers every 2 years, yearly if high risk or childbearing age with abnormal Pap in last 3 yrs. N/A    See below for due date if applicable.   Screening Mammogram   Recommended every 2 years for women age 49 to 53, or more frequent if you have a higher risk. Selectively recommended for women between 40-49 based on shared decisions about risk. Covered by Medicare up to every year for women age 98 or older /19/2025: benign    Scheduled for repeat 02/16/2025    See below for due date if applicable.         Lung Cancer Screening  Annual low dose computed tomography (LDCT scan) is recommended for those age 20-80 who smoked 20 pack-years and are current smokers or quit smoking within past 15 years, after counseling by your doctor or nurse clinician about the possible benefits or harms. N/A    See below for due date if applicable.   Vaccinations   Respiratory syncytial virus (RSV)  Age 34 years or older: Based on shared clinical decision-making with your provider.  Pneumococcal Vaccine  Recommended routinely age 33+ with one or two separate vaccines based on your risk. Recommended before age 46 if medical conditions with increased risk  Seasonal Influenza Vaccine  Once every flu season   Hepatitis B Vaccine  3 doses if risk (including anyone with diabetes or liver disease)  Shingles Vaccine  Two doses at age 57 or older  Diphtheria Tetanus Pertussis Vaccine  ONCE as adult, booster every 10 years     Immunization History   Administered Date(s) Administered    Covid-19 Vaccine,Moderna Bivalent 09/13/2021    Covid-19 Vaccine,Moderna,12 Years+ 02/14/2020, 03/12/2020, 11/14/2020    Covid-19 Vaccine,Pfizer(Comirnaty ),61mcg/0.3ml,12 yr+,Seasonal 10/15/2022, 11/08/2023    High-Dose Influenza Vaccine, 65+ 10/25/2015, 11/04/2015, 09/23/2016, 09/23/2017, 10/01/2018, 11/02/2019    INFLUENZA VIRUS VACCINE (ADMIN) 09/15/2016     Influenza Vaccine, 6 month-adult 09/24/2007, 10/05/2010, 12/20/2012, 11/24/2013, 09/30/2018, 09/23/2020, 11/08/2023    Influenza Vaccine, 65+ 11/12/2020, 10/15/2022    PREVNAR 13 11/04/2015    Pneumovax 10/03/2021    Shingrix  - Zoster Vaccine 06/19/2017, 10/03/2021    Tetanus Toxoid/Diphtheria Toxoid/Acellular Pertussis Vaccine, Adsorbed 10/04/2011, 04/24/2024    ZOSTAVAX (VARICELLA ZOSTER VACCINE) 06/04/2007   Discuss covid vaccine  Discuss RSV vaccine    Shingles vaccine and Diphtheria Tetanus Pertussis vaccines are available at pharmacies or local health department without a prescription.   Other Preventative Screening  Last Completion Date   Bone Densitometry   Screening: All females ages 55 and older every 10 years if initial screening normal. Postmenopausal women ages 74-64 need screening with one or more risk factor: previous fracture, parental hip fracture, current smoker, low body weight, excessive alcohol use, Rheumatoid Arthritis   For women with diagnosed Osteoporosis, follow up is recommended every 2 years or a frequency recommended by your provider.     --06/18/2024  Normal    See below for due date if applicable.     Glaucoma Screening   Yearly if in high risk group such as diabetes, family history, African American age 52+ or Hispanic American age 33+   See your eye care provider for screening.  04/21/2024: [facility] [place]   Hepatitis C Screening   Recommended  for those born between ages 18-79 years.     See below for due date if applicable.     HIV Testing  Recommended routinely at least ONCE, covered every year for age 52 to 31 regardless of risk, and every year for age over 38 who ask for the test or higher risk. Yearly or up to 3 times in pregnancy         See below for due date if applicable.   Abdominal Aortic Aneurysm Screening Ultrasound   Once with a family history of abdominal aortic aneurysms OR a female between 79-75 and have smoked at least 100 cigarettes in your lifetime.          See below for due date if applicable.       Your Personalized Schedule for Preventive Tests     Health Maintenance: Pending and Last Completed         Date Due Completion Date    RSV Adult 60+ or Pregnancy (1 - 1-dose 75+ series) Never done ---    Medicare Annual Wellness Visit - Calendar Year Insurers 12/25/2023 03/22/2023    Covid-19 Vaccine (7 - 2024-25 season) 05/07/2024 11/08/2023    Influenza Vaccine (1) 08/24/2024 11/08/2023    Depression Screening 03/05/2025 03/05/2024    Adult Tdap-Td (3 - Td or Tdap) 04/24/2034 04/24/2024  Non-Opioid Treatment for Chronic Pains     Treatment for chronic pain can be managed without opioids. Below are non-opioid options that may be considered and discussed with your provider to determine which options would be best for your health.    Over the counter or presciptions medications:  Acetaminophen  (Tylenol ) or Non-steroidal anti-inflammatories such as: Ibuprofen (Motrin, Advil), naproxen (Aleve), aspirin  Antidepressants such as amitriptyline , nortriptyline (Pamelor),  Doxepin (Silenor), Imipramine (Tofranil) and others.  Anticonvulsant Nerve pain medications: Gabapentin (Neurontin), pregabalin (Lyrica)  Externally applied medications such as NSAID'S, lidocaine , capsaisin, and others  Injections: pain specialists can sometimes inject medications at the site of pain.    Alternative therapies such as  Acupuncture  Osteopathic manipulation  Chiropractic  Massage therapy       For Information on Advanced Directives for Health Care:  [place]:  [email or web address]  [place], [place], [place], [place] General Information: [email or web address]      The Personalized Prevention Plan from today's visit was reviewed, explained and discussed in detail with [name]. Medications were also reviewed today and they were reconciled with [name]. All questions were answered and no further  concerns were expressed. Megan A Shockley is in agreement with the current prevention plan as detailed above.    Thank you for completing your Annual Medicare Wellness visit today. See you next year!     Audrielle Vankuren Jenkins Mon DNP, FNP-C  Medicare Wellness Provider  [facility] MEDICINE  INTERNAL MEDICINE  [facility]  [address]  [employer] [place] [address]  [phone]  [email or web address]    BMI Categories:   Underweight = <18.5  Normal weight = 18.5-24.9   Overweight = 25-29.9   Obesity = BMI of 30 or greater   Wt Readings from Last 10 Encounters:   08/03/24 50.8 kg (111 lb 15.9 oz)   07/27/24 52.3 kg (115 lb 4.8 oz)   07/24/24 52 kg (114 lb 10.2 oz)   05/25/24 53.1 kg (117 lb 1 oz)   05/13/24 53.8 kg (118 lb 9.7 oz)   04/24/24 55.3 kg (121 lb 14.6 oz)   04/24/24 55.3 kg (122 lb)   04/07/24 55.3 kg (122 lb)   03/26/24 55.5 kg (122 lb 5.7 oz)   03/05/24 55 kg (121 lb 4.1 oz)     There is no height or weight on file to calculate BMI.      HOW TO CHOOSE A HEALTHY LIFESTYLE  1. NUTRITION and WEIGHT:   -keep track and maintain a healthy weight by eating right and being physically active  -eat a variety of foods, including vegetables (dark-green leafy and deep-yellow, and legumes), fruits, dried beans, whole grains  -limit saturated fat (dairy, meats, and oils)  -watch portion sizes  2. PHYSICAL ACTIVITY  -aerobic: 30 minutes of moderate intensity exercise on 5 days each week (brisk walking, raking leaves, house cleaning, dancing, swimming, or biking)  -10 minutes of stretching of major muscle groups to maintain range of motion  -balance training  -the more active you are, the healthier you will become   3. PREVENTION  -stay up to date on your immunizations and screenings for cancer and bone health  -stop smoking  -avoid alcohol intake  -don't use illegal (street) drugs of any kind  -avoid falls  -stay up to date with dental checkups   -stay up to date with blood pressure, cholesterol, and blood sugar  checks  4. MEDICATION USE  -maintain an up-to-date medication list, including over-the-counters and herbals  5. HOME SAFETY  -assess your home regularly for falling, poisoning, fire, and suffocation hazards  6. FINANCIAL AND SOCIAL SUPPORT  -most communities have assistance available through [employer] on Aging or other municipal organizations: information can be gathered online at MassVoice.co.uk.  7. ELDER MISTREATMENT  -report to your provider any type of physical abuse (physical, sexual, psychological, financial, neglect)  8. DRIVING  -stay up to date on your visual acuity, hearing, psychomotor skills, and cognitive screenings  9. ADVANCE DIRECTIVES AND HEALTH CARE PROXY  - Powers of attorney, living wills, advanced directives, and guardianship documents become important if you become unable to participate in financial or health care decisions  10. MEDICARE AND PREVENTIVE VISITS  -stay up to date with your annual Medicare preventive visits, which are focused on identifying and mitigating health risks and educating you about available preventive services  11.  RESOURCES  -The Pocket Guide to Staying Healthy at 50+": https://assets.thebuttonzone.com.pdf  -https://aguirre-king.net/.  -BloodyScene.be  -www.CardRetirement.cz.    -https://www.norton-jordan.com/    -https://growyoungfitness.com/exercises-for-seniors    The [employer] Program:    The [employer] program is a Games developer offered by several insurance agencies for their retirees: https://www.silversneakers.com.   SilverSneakers is the nation's leading fitness program for older adults. It offers programs and classes, which are designed specifically for all fitness levels, age levels within the Medicare population, those new to fitness, and much more.   Millions of people are eligible for the SilverSneakers benefit  through leading Medicare Advantage health plans, Medicare Supplement carriers and group retiree plans. For the majority of these members, the program is available at no extra cost.   [employer] Client Relations:   -Phone: [phone]  -Email: [email or web address]    HYPERTENSION MANAGEMENT:  Eat a healthy diet that is low in sodium and fat.  Lose weight if you are overweight.  Get regular physical activity.  Manage stress.  Stop using tobacco.  Cut down on alcohol and caffeine .  Take your blood pressure medicines daily as directed.  Talk to your health care provider about all the prescribed and over-the-counter medicines you are taking.  Monitor your blood pressure three times/week and as needed daily, record, and bring to next PCP appointment.  Notify your case manager with consistently high or consistently low readings.  Remember that high blood pressure usually has no symptoms, but if symptoms do occur, the most common are:  Nosebleeds, headaches, dizziness, spots before the eyes, nausea and fatigue        CHOLESTEROL MANAGEMENT:  Eat a low fat, low cholesterol diet.  Utilize garlic, onions, herbs, and spices for flavor enhancement.  Limit eating out at fast food type restaurants.  Choose grilled, broiled, steamed or baked foods versus deep fried or pan fried foods.        CAROTID ARTERY DISEASE:     The carotid arteries are the main blood vessels that send blood and oxygen to the brain.  When these vessels become narrowed, it's called carotid artery disease or carotid artery stenosis.  Carotid artery disease reduces the flow of oxygen to the brain which needs a constant supply of oxygen to work.  If the narrowing of the carotid arteries is severe enough, it can cause a stroke.     RISK FACTORS     Older age  Female  Family History  Race  Genetic Factors  High Cholesterol  High blood pressure  Smoking  Diabetes  Overweight  Diet high in saturated fat  Lack of exercise  High  blood  pressure  Smoking  Diabetes  Overweight  Diet high in saturated fat  Lack of exercise     SYMPTOMS     The disease may have no symptoms.  In some cases, the first sign of the disease is a transient ischemic attack (TIA) or stroke.  Sudden weakness or clumsiness of an arm or leg on 1 side of the body  Sudden paralysis of an arm or leg on 1 side of the body  Loss of coordination or movement  Confusion, loss of ability to concentrate  Dizziness, fainting, or headache  Numbness or loss of feeling in the face or in an arm or leg  Temporary loss of vision or blurred vision  Inability to speak clearly or slurred speech     IF YOU HAVE ANY OF THESE SYMPTOMS, CALL FOR MEDICAL HELP RIGHT AWAY.  TREATMENT  Dependent on your symptoms, age, and general health  Dependent on how severe the condition is.  If a carotid artery is less han half narrowed, it is often treated with medicine and lifestyle changes.  If the artery is between 50% and 70% narrowed, medicine or surgery may be done.  If you smoke, quit as it constricts the blood vessels.  Lower cholesterol.  Eat a low-fat, low-cholesterol diet.  Eat plenty of vegetables, lean meats (no read meats), fruits, and high-fiber grains.  Do not eat processed foods or foods high in saturated and trans-fats.  Lower blood sugar.  Control glucose levels through a low-sugar diet and regular exercise.  Exercise - It can help you keep a healthy weight and reduce risks for carotid artery disease  Lower blood pressure.  High blood pressure causes wear and tear and inflammation in blood vessels.  This raises the risk for artery narrowing.  Blood pressure should be below 140/90.     SEEK IMMEDIATE MEDICAL ATTENTION IF YOU ARE EXPERIENCING SIGNS OR SYMPTOMS OF A STROKE OR TIA AS DISCUSSED ABOVE.            DIABETES MANAGEMENT     Please check your sugars at least twice a day.  Check a fasting sugar in the morning before you eat or drink anything.  Check two hours after one meal during the day.   Fasting sugars should range between 70 - 110.  Two hours after a meal should be less than 180.  Please notify your case manager if these readings are consistently above these goals.  Record in a log book and bring to office appointments.   If your blood sugar goes below 70 or if you are having symptoms of a low blood sugar ( sweating, confusion, dizziness, headache, irritability, blurry vision or feeling hungry ), please treat with 15 grams of carbohydrates ( 2-3 glucose tabs, 1/2 glass of juice, 1 cup of milk ). Wait 15 minutes, if sugar still feels low, repeat 15 grams of carbohydrates.  Please let your case manager know if this happens often.   Carry a source of carbohydrate with you at all times and at your bedside table.   Please limit carbohydrates ( bread, fruits, sweets, pasta, rice, potatoes, corn )  to 1/4 of the plate during mealtime.   Please increase amounts of protein and vegetables in your diet  Limit snacks in between meals to 15 grams of carbohydrates or less.   Low carb snack options are pork rinds, nuts, vegetables, eggs, string cheese, or yogurt   Please avoid sugary drinks such as sweet tea, kool-aid,  etc.  Use a sugar substitute in place of real sugar.  Please drink plenty of water  throughout the day.  Stay well hydrated!  Increase physical activity as tolerated.  Please check your feet daily.  Report any signs of redness or open areas to your case manager.  Please wear rubber soled shoes or slippers at all times.  Use a good moisturizer on your feet daily.  Please see your ophthalmologist at least once a year for a diabetic eye exa    Management of Sleep Apnea:  Signs of sleep apnea include loud snoring, noisy breathing, and gasping sounds during sleep. Daytime symptoms include waking up tired after a full night's sleep, waking up with headaches, feeling very sleepy or falling asleep during the day, and having problems with memory or concentration.  Risk factors for sleep apnea include:  Being  overweight  Being a man, or a woman in menopause  Smoking  Using alcohol or sedating medications or herbs  Having enlarged structures in the nose or throat  Home care  Lifestyle changes that can help treat snoring and sleep apnea include the following:  If you are overweight, lose weight. Talk to your healthcare provider about a weight-loss plan for you.  Avoid alcohol for 3 to 4 hours before bedtime. Avoid sedating medications. Ask your healthcare provider about the medications you take.  If you smoke, talk to your healthcare provider about ways to quit.  Sleep on your side. This can help prevent gravity from pulling relaxed throat tissues into your breathing passages.  If you have allergies or sinus problems that block your nose, ask your healthcare provider for help.       DEPRESSION MANAGEMENT:  HOME CARE:  Ongoing care and support help people manage this disease. Find a healthcare provider and therapist who meet your needs. Seek help when you feel like you may be getting ill.  Be kind to yourself. Make it a point to do things that you enjoy (gardening, walking in nature, going to a movie). Reward yourself for small successes.  Take care of your physical body. Eat a balanced diet (low in saturated fat and high in fruits and vegetables). Exercise at least 3 times a week for 30 minutes. Even mild-moderate exercise (like brisk walking) can make you feel better.  Don't drink alcohol, which can make depression worse.  Take medicine as prescribed.  Tell each of your healthcare providers about all of the prescription and over-the-counter medicines, vitamins, and supplements you take. Certain supplements interact with medicines and can result in dangerous side effects. Ask your pharmacist when you have questions about medicine interactions.  Talk with your family and trusted friends about your feelings and thoughts. Ask them to help you recognize behavior changes early so you can get help and, if needed, medicine can be  adjusted.  WHEN TO SEEK MEDICAL ADVICE:  Extreme depression, fear, anxiety, or anger toward yourself or others  Feeling out of control  Feeling that you may try to harm yourself or another  Hearing voices that others do not hear  Seeing things that others do not see  Can't sleep or eat for 3 days in a row  Friends or family express concern over your behavior and ask you to seek help  CALL 911 WITH ANY OF THE FOLLOWING:  Suicidal thoughts, a suicide plan, and the means to carry out the plan  Serious thoughts of hurting someone else  Trouble breathing  Very confused  Feeling very drowsy  or having trouble awakening  Fainting or loss of consciousness  New chest pain that becomes more severe, lasts longer, or spreads into your shoulder, arm, neck, jaw, or back  Continue to follow with your healthcare provider as advised and as needed.    Patient information: Pelvic muscle (Kegel) exercises (The Basics)   What are pelvic muscle exercises? -- Pelvic muscle exercises are exercises that strengthen the muscles that control the flow of urine and bowel movements. These exercises are also known as "Kegel" exercises. They can help keep you from leaking urine, gas, or bowel movements, if leaks are a problem for you. They can also help with a condition that affects women called "pelvic organ prolapse." In women who have pelvic organ prolapse, the organs in the lower belly drop down and press against or bulge into the vagina.   How do I learn how to do Kegel exercises? -- First ask your doctor or nurse how to do them right. He or she can help you get started.  You will need to learn which muscles to tighten. It is sometimes hard to figure out the right muscles.   A woman might learn to do Kegel exercises by:   ?Putting a finger inside her vagina and squeezing the muscles around her finger; or   ?Pretending that she is sitting on a marble and has to pick up the marble using her vagina  A man might learn to do Kegels by tightening his  butt muscles as if he were holding back gas.   Both men and women can also learn to do Kegel exercises by stopping and starting the flow of urine. If you do this, make sure to do this only once or twice to figure out the correct muscles. Some doctors think you should not do this at all, because if you get in the habit of doing it, it could cause a bladder infection.   No matter how you learn to do Kegel exercises, the important thing to know is that the muscles involved are not in your belly or your thighs.   After you learn which muscles to tighten, you can do the exercises in any position (sitting in a chair or lying down). You do not need to do them while you are in the bathroom.   How often should I do the exercises? -- Do the exercises 3 times a day, on 3 or 4 days a week. Each time, flex your muscles 8 to 12 times, and hold them tight for 6 to 8 seconds each time you tighten. Keep up this routine for at least 3 to 4 months. You will probably notice results, but it might take a little time.  How do Kegel exercises help? -- Kegel exercises can help:  ?Reduce urine leaks in people who have "stress incontinence," which means they leak urine when they cough, laugh, sneeze, or strain  ?Control sudden urges to urinate that happen to people with "urgency incontinence." (Urgency incontinence is also known as urge incontinence.)  ?Control the release of gas or bowel movements  ?Reduce pressure or bulging in the vagina caused by pelvic organ prolapse. (If you have a bulge in the vagina, see your doctor or nurse to find out the cause.)  Kegel exercises might also reduce urine leaks in men who have had surgery to treat prostate cancer or an enlarged prostate.     Patient information: Preventing falls (The Basics)   Am I at risk of falling? -- Your  risk of falling increases as you grow older. That's because getting older can make it harder to walk steadily and keep your balance. Also, the effects of falls are more serious  in older people.  Overall, 3 to 4 out of every 10 people over the age of 20 fall each year. Up to 75 percent of people who fracture a hip never recover to the point they were before they had their fracture. If you have fallen in the past, you are at higher risk of falling again.  Several things can increase your risk of a fall, including:  ?Illness  ?A change in the medicines you take  ?An unsafe or unfamiliar setting (for example, a room with rugs or furniture that might trip you, or an area you don't know well)  How can my doctor help me to avoid falling? -- Your doctor can talk to you about the following things:  ?Past falls - It is important to tell your doctor about any times you have fallen or almost fallen. He or she can then suggest ways to prevent another fall.  ?Your health conditions - Some health problems can put you at risk of falling. These include conditions that affect eyesight, hearing, muscle strength, or balance.  ?The medicines you take - Certain medicines can increase the risk of falling. These include some medicines that are used for sleeping problems, anxiety, or depression. Adding new medicines, or changing doses of some medicines, can also affect your risk of falling.  The more your doctor knows about your situation, the better he or she will be able to help you. For example, if you fell because you have a condition that causes pain, your doctor might suggest treatments to deal with the pain. Or if one of your medicines is making you dizzy and more likely to fall, your doctor might switch you to a different medicine.  Is there anything I can do on my own? -- Yes. To help keep from falling, you can:  ?Make your home safer - To avoid falling at home, get rid of things that might make you trip or slip. This might include furniture, electrical cords, clutter, and loose rugs. Keep your home well lit so that you can easily see where you are going. Avoid storing things in high places so you don't  have to reach or climb.  ?Wear sturdy shoes that fit well - Wearing shoes with high heels or slippery soles, or shoes that are too loose, can lead to falls. Walking around in bare feet, or only socks, can also increase your risk of falling.  ?Take vitamin D  pills - Taking vitamin D  might lower the risk of falls in older people. This is because vitamin D  helps make bones and muscles stronger. Your doctor can help you decide how much vitamin D  to take.  ?Stay active - Exercising on a regular basis can help lower your risk of falling. It might also help prevent you from getting hurt if you do fall. It is best to do a few different activities that help with both strength and balance. There are many kinds of exercise that can be safe for older people. These include walking, swimming, and Tai Chi (a [place] martial art that involves slow, gentle movements).  ?Use a cane, walker, and other safety devices - If your doctor recommends that you use a cane or walker, be sure that it's the right size and you know how to use it. There are other devices  that might help you avoid falling, too. These include grab bars or a sturdy seat for the shower, non-slip bath mats, and hand rails or treads for the stairs (to prevent slipping).  If you worry that you could fall, there are also alarm buttons that let you call for help if you fall and can't get up.  What should I do if I fall? -- If you fall, see your doctor right away, even if you aren't hurt. Your doctor can try to figure out what caused you to fall, and how likely you are to fall again. He or she will do an exam and talk to you about your health problems, medicines, and activities. Then he or she can suggest things you can do to avoid falling again.  Many older people have a hard time recovering after a fall. Doing things to prevent falling can help you to protect your health and independence.

## 2024-08-15 ENCOUNTER — Other Ambulatory Visit (HOSPITAL_BASED_OUTPATIENT_CLINIC_OR_DEPARTMENT_OTHER): Payer: Self-pay | Admitting: PHYSICIAN ASSISTANT

## 2024-08-15 ENCOUNTER — Other Ambulatory Visit (HOSPITAL_BASED_OUTPATIENT_CLINIC_OR_DEPARTMENT_OTHER): Payer: Self-pay | Admitting: Student in an Organized Health Care Education/Training Program

## 2024-08-15 DIAGNOSIS — K219 Gastro-esophageal reflux disease without esophagitis: Secondary | ICD-10-CM

## 2024-08-15 DIAGNOSIS — R110 Nausea: Secondary | ICD-10-CM

## 2024-08-18 ENCOUNTER — Encounter (HOSPITAL_BASED_OUTPATIENT_CLINIC_OR_DEPARTMENT_OTHER): Payer: Self-pay | Admitting: Family

## 2024-08-18 ENCOUNTER — Ambulatory Visit
Admission: RE | Admit: 2024-08-18 | Discharge: 2024-08-18 | Disposition: A | Discharge status: Home / Self Care | Payer: Self-pay | Source: Ambulatory Visit | Attending: PHYSICIAN ASSISTANT | Admitting: PHYSICIAN ASSISTANT

## 2024-08-18 ENCOUNTER — Ambulatory Visit (HOSPITAL_BASED_OUTPATIENT_CLINIC_OR_DEPARTMENT_OTHER): Payer: Self-pay | Admitting: Family

## 2024-08-18 ENCOUNTER — Other Ambulatory Visit: Payer: Self-pay

## 2024-08-18 ENCOUNTER — Ambulatory Visit (HOSPITAL_COMMUNITY)
Admission: RE | Admit: 2024-08-18 | Discharge: 2024-08-18 | Disposition: A | Discharge status: Home / Self Care | Payer: Self-pay | Source: Ambulatory Visit

## 2024-08-18 DIAGNOSIS — Z0000 Encounter for general adult medical examination without abnormal findings: Secondary | ICD-10-CM

## 2024-08-18 DIAGNOSIS — Z8719 Personal history of other diseases of the digestive system: Secondary | ICD-10-CM

## 2024-08-18 DIAGNOSIS — K219 Gastro-esophageal reflux disease without esophagitis: Secondary | ICD-10-CM | POA: Insufficient documentation

## 2024-08-18 DIAGNOSIS — R110 Nausea: Secondary | ICD-10-CM | POA: Insufficient documentation

## 2024-08-18 DIAGNOSIS — R630 Anorexia: Secondary | ICD-10-CM | POA: Insufficient documentation

## 2024-09-09 ENCOUNTER — Ambulatory Visit: Payer: Self-pay | Attending: Family | Admitting: Family

## 2024-09-09 ENCOUNTER — Encounter (HOSPITAL_BASED_OUTPATIENT_CLINIC_OR_DEPARTMENT_OTHER): Payer: Self-pay | Admitting: Family

## 2024-09-09 ENCOUNTER — Other Ambulatory Visit: Payer: Self-pay

## 2024-09-09 VITALS — BP 142/54 | HR 80 | Temp 97.4°F | Ht 63.0 in | Wt 109.6 lb

## 2024-09-09 DIAGNOSIS — L821 Other seborrheic keratosis: Secondary | ICD-10-CM

## 2024-09-09 DIAGNOSIS — D229 Melanocytic nevi, unspecified: Secondary | ICD-10-CM

## 2024-09-09 DIAGNOSIS — L570 Actinic keratosis: Secondary | ICD-10-CM | POA: Insufficient documentation

## 2024-09-09 DIAGNOSIS — D1801 Hemangioma of skin and subcutaneous tissue: Secondary | ICD-10-CM

## 2024-09-09 DIAGNOSIS — D692 Other nonthrombocytopenic purpura: Secondary | ICD-10-CM

## 2024-09-09 DIAGNOSIS — Z1283 Encounter for screening for malignant neoplasm of skin: Secondary | ICD-10-CM | POA: Insufficient documentation

## 2024-09-09 DIAGNOSIS — L989 Disorder of the skin and subcutaneous tissue, unspecified: Secondary | ICD-10-CM

## 2024-09-09 NOTE — Progress Notes (Signed)
 Dermatology Clinic, [facility]  [address]  [place] [address]  [phone]    Date:   09/09/2024  Name: Rachel [name]  Age: 83 y.o.  Last visit: 09/11/2023    Chief complaint:   Chief Complaint   Patient presents with    Follow Up      HPI  Rachel [name] is a 83 y.o. female with no personal/family history of skin cancer, presenting for a total body skin exam with concern for a dry lesion on the nose, as well as bruising on the bilateral arms and legs. Regarding the dry lesion on the nasal tip, patient declines cryotherapy at today's visit. No other new, changing, bleeding, or symptomatic lesions or moles. No other skin-related complaints.      Review of Systems   Constitutional:  Negative for chills and fever.   Skin:  Negative for itching and rash.      Current Medications  Outpatient Encounter Medications as of 09/09/2024   Medication Sig Dispense Refill    azelastine  (ASTELIN ) 137 mcg (0.1 %) Nasal Spray, Non-Aerosol Administer 1 Spray into each nostril Twice daily for 180 days Use in each nostril as directed Indications: seasonal runny nose 30 mL 5    Dexlansoprazole  (DEXILANT ) 60 mg Oral Cap, Delayed Rel., Multiphasic Take 1 Capsule (60 mg total) by mouth Daily Indications: gastroesophageal reflux disease, heartburn 90 Capsule 3    famotidine  (PEPCID ) 20 mg Oral Tablet Take 1 Tablet (20 mg total) by mouth Twice daily 60 Tablet 1    fexofenadine  (ALLEGRA  ALLERGY) 180 mg Oral Tablet Take 1 Tablet (180 mg total) by mouth Daily for 180 days Indications: inflammation of the nose due to an allergy 90 Tablet 1    fluticasone  propionate (FLONASE ) 50 mcg/actuation Nasal Spray, Suspension Administer 1 Spray into each nostril Once a day 48 g 3    fluticasone  propionate (FLONASE ) 50 mcg/actuation Nasal Spray, Suspension Administer 1 Spray into each nostril Daily 48 g 3    lactobacillus acidoph-pectin (ACIDOPHILUS-PECTIN) 75 million cell -100 mg Oral Capsule Take 1 Capsule by  mouth Daily 90 Capsule 1    [EXPIRED] ondansetron  (ZOFRAN  ODT) 4 mg Oral Tablet, Rapid Dissolve Take 1 Tablet (4 mg total) by mouth Every 8 hours as needed for Nausea/Vomiting for up to 30 days (Patient not taking: Reported on 09/09/2024) 30 Tablet 1    [EXPIRED] Oxycodone  (ROXICODONE ) 10 mg Oral Tablet Take 1 Tablet (10 mg total) by mouth Every 6 hours as needed for Pain for up to 30 days Indications: pain, osteoarthritis (Patient not taking: Reported on 09/09/2024) 120 Tablet 0    solifenacin  (VESICARE ) 10 mg Oral Tablet Take 1 Tablet (10 mg total) by mouth Daily 90 Tablet 3     Facility-Administered Encounter Medications as of 09/09/2024   Medication Dose Route Frequency Provider Last Rate Last Admin    lidocaine  4 % + phenylephrine  1 % (1:1) nasal solution  5 Drop INTRANASAL Once Makary, Chadi, MD            Allergies   Allergen Reactions    Latex     Darvon [Propoxyphene] Mental Status Effect     Hallucinations    Poison Ivy Extract     Shellfish Derived         Past Medical History:   Diagnosis Date    Chronic pain     Depression     GERD (gastroesophageal reflux disease)     Low back pain  Memory changes     Migraine     Neck problem     Osteoarthritis     Shortness of breath     Wears glasses        Physical Exam  Vitals:    Temp:  [36.3 C (97.4 F)] 36.3 C (97.4 F) (09/17 1312)  HR:  [80] 80 (09/17 1312)  BP: (142)/(54) 142/54 (09/17 1312)   Vitals:    09/09/24 1312   BP: (!) 142/54   Pulse: 80   Temp: 36.3 C (97.4 F)   TempSrc: Thermal Scan   Weight: 49.7 kg (109 lb 9.1 oz)   Height: 1.6 m (5' 3)        Physical Exam  Constitutional:       General: She is not in acute distress.     Appearance: Normal appearance.   HENT:      Head:     Eyes:      General: Lids are normal.   Skin:     General: Skin is warm and dry.      Coloration: Skin is not pale.        Neurological:      Mental Status: She is alert and oriented to person, place, and time. Mental status is at baseline.      Coordination:  Coordination normal.   Psychiatric:         Mood and Affect: Mood normal.       General skin exam was performed including head, neck, anterior/posterior trunk, bilateral upper and lower extremities and revealed no areas of concern other than those documented.     Assessment and Plan    #Actinic Keratoses (see #2 on head diagram) - new  - Discussed that this is considered a premalignant lesion with potential for development of squamous cell carcinoma  - Discussed treatment options including observation, cryotherapy with liquid nitrogen, topical therapy with 5-fluorouracil or imiquimod, and photodynamic therapy  - Offered cryotherapy at today's visit. Patient declines at this time.    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 Will continue to follow.     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   - Reviewed SPF 30 sunscreen and protective clothing when out.     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 The condition is usually asymptomatic, and unless disturbed, most seborrheic keratoses persist and grow slowly. No intervention is indicated at this time.    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  - Will follow.    #Sun Exposure  Skin Cancer Screening   #Multiple nevi  - Benign in appearance both grossly and on dermoscopy.  - Will follow.  - Recommend protection from ultraviolet radiation from the sun and avoidance of sunburns and tanning beds.  Recommend broad spectrum sunscreen daily with at least SPF 30, especially one that contains zinc and titanium. Reapply sunscreen every  two hours. It is important to wear protective clothing and avoid the mid-day sun (approx 10AM till 2PM) when possible   - Recommend self-assessment using the ABCDE's of melanoma to locate areas of concern: Asymmetry, Border irregularity, Color variation, Diameter (>89mm), Evolution of lesion.       Return in about 1 year (around 09/09/2025).    This visit was rendered independently with a supervising physician on site.     I am scribing for, and in the presence of,  Chritopher Coster APRN, FNP-BC, for services provided on 09/09/2024.  Arsenio Scarce, SCRIBE     I personally performed the services described in this documentation, as scribed  in my presence, and it is both accurate  and complete.    [employer], APRN,FNP-BC

## 2024-09-10 ENCOUNTER — Encounter (HOSPITAL_COMMUNITY): Payer: Self-pay

## 2024-09-10 DIAGNOSIS — M1990 Unspecified osteoarthritis, unspecified site: Secondary | ICD-10-CM

## 2024-09-15 NOTE — Telephone Encounter (Signed)
 [facility]  [address]  [place], [place] [address]       PATIENT NAME: Rachel [name]  MRN: [id number]  DOB: 1941/04/16        Date: 09/15/2024  Time: 09:58        [facility]  [address]  [place], [place] [address]        Pain contract on file/ up to date? yes  Urine drug screen within the last year? no  Appointment with prescribing provider within the last three months? tes  Last office visit: 08/03/2024  Next office visit: 11/04/2024    Order pended      Rico Merry, MSN, RN  Clinical Nurse Coordinator

## 2024-09-15 NOTE — Telephone Encounter (Signed)
-----   Message from Delon RAMAN, [place] COORDINATOR sent at 09/15/2024  9:53 AM EDT -----  Regarding: FW: Refill Request  ----- Message from Tawni FALCON sent at 09/15/2024  9:44 AM EDT -----  Copied From CRM #[id number].DBAIBOU, JANA, MD    Rachel [name], Rachel [name] (Self) called to request a prescription refill.     Oxycodone  (ROXICODONE ) 10 mg Oral Tablet 120 Tablet 0 08/07/2024 09/06/2024   Sig - Route: Take 1 Tablet (10 mg total) by mouth Every 6 hours as needed for Pain for up to 30 days Indications: pain, osteoarthritis - Oral     Preferred Pharmacy     [facility] [phone] [place], [place] - [address] AT   [facility] [address]    [address] [facility] [address]    Phone: [phone] Fax: [phone]    Hours: Not open 24 hours

## 2024-09-16 ENCOUNTER — Telehealth (HOSPITAL_BASED_OUTPATIENT_CLINIC_OR_DEPARTMENT_OTHER): Payer: Self-pay | Admitting: Student in an Organized Health Care Education/Training Program

## 2024-09-16 MED ORDER — OXYCODONE 10 MG TABLET
10.0000 mg | ORAL_TABLET | Freq: Four times a day (QID) | ORAL | 0 refills | Status: DC | PRN
Start: 2024-09-16 — End: 2024-09-17

## 2024-09-16 NOTE — Telephone Encounter (Signed)
 [facility]  [address]  [place], [place] [address]       PATIENT NAME: Rachel [name]  MRN: [id number]  DOB: [birth year]        Date: 09/16/2024  Time: 10:07      Pended E-Rx    Rico Merry, MSN, RN  Clinical Nurse Coordinator

## 2024-09-16 NOTE — Addendum Note (Signed)
 Addended by: NETTY PINA A. on: 09/16/2024 10:33 AM     Modules accepted: Orders

## 2024-09-16 NOTE — Telephone Encounter (Addendum)
 [facility]  [address]  [place], [place] [address]       PATIENT NAME: Rachel [name]  MRN: [id number]  DOB: [birth year]        Date: 09/16/2024  Time: 12:38      Dr. Edgardo contacted me regarding that she was is unable to send the Oxycodone  10 mg via E-Rx. She keeps getting a message that is stating that she needs a department address. It does not allow her to add this or give options. Dr. Edgardo has printed out the script. [facility] pharmacy and they are unable to accept the script via fax. They can accept it if the patient brings it in or via E-Rx.     Dr. Edgardo will attempt to send a new script to see if that helps.    Rico Merry, MSN, RN  Clinical Nurse Coordinator

## 2024-09-17 ENCOUNTER — Ambulatory Visit (HOSPITAL_BASED_OUTPATIENT_CLINIC_OR_DEPARTMENT_OTHER): Payer: Self-pay | Admitting: Student in an Organized Health Care Education/Training Program

## 2024-09-17 ENCOUNTER — Telehealth (HOSPITAL_BASED_OUTPATIENT_CLINIC_OR_DEPARTMENT_OTHER): Payer: Self-pay | Admitting: Student in an Organized Health Care Education/Training Program

## 2024-09-17 MED ORDER — OXYCODONE 10 MG TABLET
10.0000 mg | ORAL_TABLET | Freq: Four times a day (QID) | ORAL | 0 refills | Status: DC | PRN
Start: 2024-09-17 — End: 2024-10-13

## 2024-09-17 NOTE — Telephone Encounter (Signed)
 [facility]  [address]  [place], [place] [address]       PATIENT NAME: Rachel [name]  MRN: [id number]  DOB: 01-Sep-1941        Date: 09/17/2024  Time: 14:22      Attempted to call patient. Left message with call back number.      Rico Merry, MSN, RN  Clinical Nurse Coordinator

## 2024-09-17 NOTE — Telephone Encounter (Signed)
 [facility]  [address]  [place] [facility] [place], [place] [address]       PATIENT NAME: Rachel [name]  MRN: [id number]  DOB: July 21, 1941        Date: 09/17/2024  Time: 13:29      Placed prescription for Oxycodone  at the [facility] third floor front desk for pick-up.      Sotero Balling, BSN RN  Clinical Nurse Coordinator

## 2024-09-17 NOTE — Telephone Encounter (Signed)
-----   Message from Delon RAMAN, [place] COORDINATOR sent at 09/17/2024  2:00 PM EDT -----  Regarding: FW: Clinical Question  ----- Message from Tawni FALCON sent at 09/17/2024  1:52 PM EDT -----  Copied From CRM #[id number].EDGARDO POINTER, MD    Gino, Suann A (Self) called to ask if that she developed sinus issue and states you know this and prescribe her meds.   Pt declined to make an appt unless it is needed. Please call pt to advise.        Preferred Pharmacy     [facility] STORE [phone] - [place], [place] - [address] AT   [facility] [address]    [address] [facility] [address]    Phone: [phone] Fax: [phone]    Hours: Not open 24 hours

## 2024-09-17 NOTE — Addendum Note (Signed)
 Addended by: EDGARDO POINTER on: 09/17/2024 12:30 PM     Modules accepted: Orders

## 2024-09-17 NOTE — Addendum Note (Signed)
 Addended by: Antwaine Boomhower on: 09/17/2024 10:41 AM     Modules accepted: Orders

## 2024-09-17 NOTE — Telephone Encounter (Signed)
 [facility]  [address]  [place], [place] [address]       PATIENT NAME: Rachel [name]  MRN: [id number]  DOB: 01-27-1941        Date: 09/17/2024  Time: 12:27      Patient called in to the call center inquiring of her prescription for Oxycodone . Dr. Edgardo is going to print off the prescription and sign it for the patient to be able to pick up.  Patient called and will be in at 2 pm to pick up the prescription.    Rico Merry, MSN, RN  Clinical Nurse Coordinator

## 2024-09-30 ENCOUNTER — Other Ambulatory Visit (HOSPITAL_BASED_OUTPATIENT_CLINIC_OR_DEPARTMENT_OTHER): Payer: Self-pay | Admitting: Student in an Organized Health Care Education/Training Program

## 2024-09-30 DIAGNOSIS — F39 Unspecified mood [affective] disorder: Secondary | ICD-10-CM

## 2024-09-30 NOTE — Telephone Encounter (Addendum)
 [facility]  [address]  [place], [place] [address]       PATIENT NAME: Rachel [name]  MRN: [id number]  DOB: [birth year]        Date: 09/30/2024  Time: 11:02      Received message from call center through schedule request that patient is requesting a new prescription for Cymbalta . She previously took it for years and would like to restart it if Dr. Edgardo is willing to prescribe. Pended Rx from medication history.    Last office visit: 08/03/2024  Next office visit: 11/04/2024      Sotero Balling, BSN RN  Clinical Nurse Coordinator

## 2024-10-05 MED ORDER — DULOXETINE 30 MG CAPSULE,DELAYED RELEASE: 30.0000 mg | DELAYED_RELEASE_CAPSULE | Freq: Every day | ORAL | 1 refills | Status: DC

## 2024-10-05 NOTE — Addendum Note (Signed)
 Addended by: Tikia Skilton on: 10/05/2024 04:01 PM     Modules accepted: Orders

## 2024-10-07 ENCOUNTER — Encounter (INDEPENDENT_AMBULATORY_CARE_PROVIDER_SITE_OTHER): Payer: Self-pay | Admitting: PODIATRY

## 2024-10-07 ENCOUNTER — Ambulatory Visit (INDEPENDENT_AMBULATORY_CARE_PROVIDER_SITE_OTHER): Payer: Self-pay | Admitting: PODIATRY

## 2024-10-07 ENCOUNTER — Other Ambulatory Visit: Payer: Self-pay

## 2024-10-07 DIAGNOSIS — L859 Epidermal thickening, unspecified: Secondary | ICD-10-CM

## 2024-10-07 DIAGNOSIS — M2012 Hallux valgus (acquired), left foot: Secondary | ICD-10-CM

## 2024-10-07 DIAGNOSIS — M79676 Pain in unspecified toe(s): Secondary | ICD-10-CM

## 2024-10-07 DIAGNOSIS — M2528 Flail joint, other site: Secondary | ICD-10-CM

## 2024-10-07 NOTE — Progress Notes (Signed)
 Subjective:  This 83 year old female presents for evaluation of her bilateral foot pain.  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  She also reports that she has a floppy 2nd 3rd toe as well as pain along the bottom of the foot and she has a painful thickened area along the lateral border of the 4th toe of the left foot.  She also reports painful toenails which she has difficulty cutting.  They are thick and discolored.  She has no other complaints    Objective:  Pleasant cooperative 82 year old female presents for foot exam    Vascular:  Palpable pulses with signs of venous disease noted for the bilateral feet no significant edema present.  ABI/PVRs from previous exam were reviewed    Dermatologic examination:  Dystrophic elongated toenails 1 through 5 bilaterally that are painful to direct palpation.  There is a painful thickness along the lateral border of the 4th toe which was a thickened callus with nail in it this was debrided.  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    Orthopedic examination:  Patient continues to have a hallux that deviates laterally on the left foot and abuts the 3rd toe the 3rd toe has a prominent middle phalanx plantarly with no signs of wounds or skin breakdown at this point.  There is also prominent 5th metatarsal head present and the 2nd and 3rd toes are "floppy" at the DIPJ for 2 and 3.    Assessment  Toe pain  Toe deformity  History of hammertoe surgery  History of bunions  Recurrent deformity  Abducted hallux  Foot pain    Plan  Dispensed tube foam for the painful toes  Recommended gel toe sleeve  Recommended gel bunion splint with spacer  Debridement of toenails and calluses  Follow-up p.r.n.      On the  day of the encounter, a total of  35 minutes was spent on this patient encounter including review of historical information, examination, documentation and post-visit activities. The time documented excludes procedural time.    This note may have been partially generated using MModal Fluency Direct system and there may be some incorrect words, spellings, and punctuation that were not noted in checking the note before saving.    Yosmar Ryker Kelly-Danhires, DPM  Section of [employer] Department of Orthopedics    [employer], DPM  10/07/2024, 16:32

## 2024-10-12 ENCOUNTER — Other Ambulatory Visit (HOSPITAL_BASED_OUTPATIENT_CLINIC_OR_DEPARTMENT_OTHER): Payer: Self-pay | Admitting: PHYSICIAN ASSISTANT

## 2024-10-12 DIAGNOSIS — R110 Nausea: Secondary | ICD-10-CM

## 2024-10-12 DIAGNOSIS — K219 Gastro-esophageal reflux disease without esophagitis: Secondary | ICD-10-CM

## 2024-10-13 ENCOUNTER — Other Ambulatory Visit (HOSPITAL_BASED_OUTPATIENT_CLINIC_OR_DEPARTMENT_OTHER): Payer: Self-pay | Admitting: Student in an Organized Health Care Education/Training Program

## 2024-10-13 DIAGNOSIS — M1990 Unspecified osteoarthritis, unspecified site: Secondary | ICD-10-CM

## 2024-10-13 MED ORDER — OXYCODONE 10 MG TABLET
10.0000 mg | ORAL_TABLET | Freq: Four times a day (QID) | ORAL | 0 refills | Status: DC | PRN
Start: 2024-10-13 — End: 2024-11-11

## 2024-10-13 NOTE — Telephone Encounter (Signed)
 [facility]  [address]  [place], [place] [address]        Pain contract on file/ up to date? Yes    Urine drug screen within the last year? no  Appointment with prescribing provider within the last three months? yes  Last office visit: 08/03/2024  Next office visit: 11/04/2024    Delon List, NURSE COORDINATOR

## 2024-10-13 NOTE — Telephone Encounter (Signed)
 Regarding: Refill Request  ----- Message from Grayce HERO sent at 10/13/2024  1:06 PM EDT -----  Copied From CRM #[id number].Grivas, Tiya A (Gearldene Fiorenza) called to request a prescription refill.       Dr. Edgardo  The pt states she's leaving to go out of town on the 24th, and she's asking if you would send a Rx refill  of the Oxycodone  10 mg taking four times daily and tell the pharmacy to allow her to fill this on the 23rd since she's leaving early on the 24th?  Please call patient to advise.     Preferred Pharmacy     [facility] STORE [phone] [place], [place] - [address] AT   [facility] [address]    [address] [facility] [place] [address]    Phone: [phone] Fax: [phone]    Hours: Not open 24 hours     Last apt 08-03-24  Next apt  11-04-24

## 2024-10-13 NOTE — Telephone Encounter (Signed)
 Last scheduled appointment with you was 08/03/24.  Currently scheduled next future appointment with you is 11/04/24  The next office visit in this department is 11/04/2024    Damien Chestnut, Ambulatory Care Assistant  10/13/2024, 08:26

## 2024-11-02 ENCOUNTER — Other Ambulatory Visit (INDEPENDENT_AMBULATORY_CARE_PROVIDER_SITE_OTHER): Payer: Self-pay | Admitting: Otolaryngology

## 2024-11-02 MED ORDER — FLUTICASONE PROPIONATE 50 MCG/ACTUATION NASAL SPRAY,SUSPENSION: 1.0000 | Freq: Every day | NASAL | 3 refills | Status: DC

## 2024-11-02 NOTE — Telephone Encounter (Signed)
 Lov 04/07/24

## 2024-11-04 ENCOUNTER — Other Ambulatory Visit: Payer: Self-pay

## 2024-11-04 ENCOUNTER — Encounter (HOSPITAL_BASED_OUTPATIENT_CLINIC_OR_DEPARTMENT_OTHER): Payer: Self-pay | Admitting: Student in an Organized Health Care Education/Training Program

## 2024-11-04 ENCOUNTER — Ambulatory Visit
Payer: Self-pay | Attending: Student in an Organized Health Care Education/Training Program | Admitting: Student in an Organized Health Care Education/Training Program

## 2024-11-04 VITALS — BP 146/60 | HR 85 | Temp 97.9°F | Ht 63.0 in | Wt 108.2 lb

## 2024-11-04 DIAGNOSIS — F32A Depression, unspecified: Secondary | ICD-10-CM

## 2024-11-04 DIAGNOSIS — R32 Unspecified urinary incontinence: Secondary | ICD-10-CM

## 2024-11-04 DIAGNOSIS — J329 Chronic sinusitis, unspecified: Secondary | ICD-10-CM | POA: Insufficient documentation

## 2024-11-04 DIAGNOSIS — K5909 Other constipation: Secondary | ICD-10-CM | POA: Insufficient documentation

## 2024-11-04 DIAGNOSIS — F419 Anxiety disorder, unspecified: Secondary | ICD-10-CM

## 2024-11-04 DIAGNOSIS — R350 Frequency of micturition: Secondary | ICD-10-CM | POA: Insufficient documentation

## 2024-11-04 MED ORDER — BISACODYL 5 MG TABLET,DELAYED RELEASE: 5.0000 mg | DELAYED_RELEASE_TABLET | ORAL | 5 refills | Status: DC | PRN

## 2024-11-04 MED ORDER — GEMTESA 75 MG TABLET: 1.0000 | ORAL_TABLET | Freq: Every evening | ORAL | 3 refills | Status: DC

## 2024-11-04 MED ORDER — BISACODYL 10 MG RECTAL SUPPOSITORY
10.0000 mg | Freq: Once | RECTAL | 5 refills | Status: AC
Start: 2024-11-04 — End: 2024-11-04

## 2024-11-04 NOTE — Progress Notes (Addendum)
 INTERNAL MEDICINE   [facility]  Return Visit Progress Note     Name: Rachel [name] Date of Service: 11/04/2024   MRN:  [id number]  Age/DOB: 83 y.o.1941/10/01   PCP:  Benedict Marble, MD         CHIEF COMPLAINT:   Constipation and Frequent Urination      SUBJECTIVE:  History of Present Illness  Rachel [name] is an 83 year old female who presents for ongoing management of urinary incontinence and constipation.    She experiences persistent urinary incontinence despite taking Vesicare  (solifenacin ) daily. She reports increased frequency of urination and feels the medication is not effective. Her sister, who has a similar condition, finds relief with a combination of solifenacin  and gemcitabine, and she is interested in trying this combination.    She suffers from chronic constipation, currently managed with Miralax , which she finds insufficient as it takes three days to have an effect. She is aware of Dulcolax (bisacodyl) as a potential treatment option and is open to trying it.    She mentions a recent sinus infection that is ongoing but does not provide further details about the symptoms or treatment for this condition.    She discusses significant social stressors, including marital issues with her husband, [name], and a lack of family support during holidays. She wants to see a counselor but faces financial constraints due to her current insurance, Medicare, not covering the costs. She is considering alternative insurance options like [employer]. She recently attended a family wedding in [place] , where she observed concerning behavior from her husband, contributing to her stress. She also describes issues with household help, noting that a cleaning service she hired did not perform as expected, adding to her stress at home.    OBJECTIVE:  Current Outpatient Medications   Medication Sig    azelastine  (ASTELIN ) 137 mcg (0.1 %) Nasal Spray, Non-Aerosol Administer 1 Spray into each nostril Twice daily for  180 days Use in each nostril as directed Indications: seasonal runny nose (Patient not taking: Reported on 11/04/2024)    bisacodyL (DULCOLAX) 10 mg Rectal Suppository Insert 1 Suppository (10 mg total) into the rectum One time for 1 dose    bisacodyL (DULCOLAX) 5 mg Oral Tablet, Delayed Release (E.C.) Take 1 Tablet (5 mg total) by mouth Every 24 hours as needed for Constipation for up to 180 days Indications: constipation    Dexlansoprazole  (DEXILANT ) 60 mg Oral Cap, Delayed Rel., Multiphasic Take 1 Capsule (60 mg total) by mouth Daily Indications: gastroesophageal reflux disease, heartburn    DULoxetine  (CYMBALTA  DR) 30 mg Oral Capsule, Delayed Release(E.C.) Take 1 Capsule (30 mg total) by mouth Daily    fluticasone  propionate (FLONASE ) 50 mcg/actuation Nasal Spray, Suspension Administer 1 Spray into each nostril Once a day    fluticasone  propionate (FLONASE ) 50 mcg/actuation Nasal Spray, Suspension Administer 1 Spray into each nostril Daily    lactobacillus acidoph-pectin (ACIDOPHILUS-PECTIN) 75 million cell -100 mg Oral Capsule Take 1 Capsule by mouth Daily    Oxycodone  (ROXICODONE ) 10 mg Oral Tablet Take 1 Tablet (10 mg total) by mouth Every 6 hours as needed for Pain for up to 30 days Indications: pain, osteoarthritis    solifenacin  (VESICARE ) 10 mg Oral Tablet Take 1 Tablet (10 mg total) by mouth Daily    vibegron (GEMTESA) 75 mg Oral Tablet Take 1 Tablet (75 mg total) by mouth Every night     Past Medical History:   Diagnosis Date    Chronic pain  Depression     GERD (gastroesophageal reflux disease)     Low back pain     Memory changes     Migraine     Neck problem     Osteoarthritis     Shortness of breath     Wears glasses          Past Surgical History:   Procedure Laterality Date    ANTERIOR FUSION CERVICAL SPINE  12/30/2020    BREAST IMPLANT REMOVAL Bilateral     2022    CYSTOURETHROSCOPY      FOOT SURGERY Bilateral     HX BREAST AUGMENTATION Bilateral     x3, 1974, 1994, 2014    HX CATARACT REMOVAL  Bilateral 01/12/2021    HX CERVICAL SPINE SURGERY      HX ROTATOR CUFF REPAIR Right     SACROILIAC JOINT INJECTION      TOE AMPUTATION           BP (!) 146/60 (Patient Position: Sitting)   Pulse 85   Temp 36.6 C (97.9 F) (Thermal Scan)   Ht 1.6 m (5' 3)   Wt 49.1 kg (108 lb 3.9 oz)   SpO2 99%   BMI 19.17 kg/m       Physical Exam    Aox3  No acute distress  Well groomed  No abdominal distention  Unlabored breathing  No notable rashes  RESULTS:  Results      Last CBC  (Last result in the past 2 years)        WBC   HGB   HCT   MCV   Platelets      08/03/24 1152 4.3   11.0   34.5   94.8   204            Last BMP  (Last result in the past 2 years)        Na   K   Cl   CO2   BUN   Cr   Calcium   Glucose   Glucose-Fasting        08/03/24 1152 142   4.8   108   26   15   0.86   9.0  Comment: Gadolinium-containing contrast can interfere with calcium measurement.     94               Lab Results   Component Value Date    TSH 0.642 04/24/2024     Lab Results   Component Value Date    HA1C 5.4 03/03/2024     Lab Results   Component Value Date    TRIG 167 (H) 09/13/2021    HDLCHOL 72 09/13/2021    LDLCHOL 125 (H) 09/13/2021    CHOLESTEROL 230 (H) 09/13/2021       ASSESSMENT/PLAN:  Assessment & Plan  Urinary incontinence  Persistent incontinence despite solifenacin . Increased urination frequency suggests medication ineffectiveness.  - Continue solifenacin  10 mg daily.  - Added gemcitabine at night.  - Discussed gemcitabine cost and GoodRx coupon.    Chronic constipation  Inadequate relief from Miralax .  - Continue Miralax  as prescribed.  - Added bisacodyl suppository for immediate relief.  - Prescribed bisacodyl tablets for daily use if needed.  - Advised morning suppository use and response monitoring.    Depression and anxiety symptoms  Ongoing symptoms exacerbated by stressors. Financial barriers to counseling due to insurance limitations.  - Provided names of three female counselors accepting [employer].  - Encouraged  contacting counselors for sessions.  - Discussed paying out of pocket for initial sessions.  - Suggested considering [employer] for better coverage.    Benedict Marble, MD  Assistant Professor of Internal Medicine  [facility]  [facility]

## 2024-11-04 NOTE — Patient Instructions (Addendum)
 Ambulance Person, MA, TEACHERS INSURANCE AND ANNUITY ASSOCIATION, ALPS  [employer]  [address]  [place], [place] [address]  [phone]  [facility]  [address]  [place], [place] [address]    Bridgette Albee  Counselor, [place], [place], [place], EMDR-C, ALPS (she, her)  [facility]  [address]  [place], [place] [address]  [phone]       [facility]  [address]  [place], [place] [address]  [phone]

## 2024-11-04 NOTE — Nursing Note (Signed)
 11/04/24 1015   Recent Weight Change   Have you had a recent unexplained weight loss or gain? N   Health Education and Literacy   How often do you have a problem understanding what is told to you about your medical condition?  Never   Domestic Violence   Because we are aware of abuse and domestic violence today, we ask all patients: Are you being hurt, hit, or frightened by anyone at your home or in your life?  N   Basic Needs   Do you have any basic needs within your home that are not being met? (such as Food, Shelter, [employer], Tranportation, paying for bills and/or medications) N   Advanced Directives   Do you have any advanced directives? Living Will & MPOA   Do you have the Advanced Directive(s) so we can scan them to your chart? N   Would you like an advanced directive packet? Refused Packet

## 2024-11-05 ENCOUNTER — Ambulatory Visit (HOSPITAL_BASED_OUTPATIENT_CLINIC_OR_DEPARTMENT_OTHER): Payer: Self-pay | Admitting: Student in an Organized Health Care Education/Training Program

## 2024-11-05 ENCOUNTER — Encounter (HOSPITAL_BASED_OUTPATIENT_CLINIC_OR_DEPARTMENT_OTHER): Payer: Self-pay | Admitting: Student in an Organized Health Care Education/Training Program

## 2024-11-05 DIAGNOSIS — R300 Dysuria: Secondary | ICD-10-CM

## 2024-11-05 NOTE — Telephone Encounter (Signed)
 Medical Group [facility]  [address]  [place], [place] [address]       PATIENT NAME: Rachel [name]  MRN: [id number]  DOB: 10/05/1941        Date: 11/05/2024  Time: 09:52      Pended UA order.      Sotero Balling, BSN RN  Clinical Nurse Coordinator

## 2024-11-05 NOTE — Telephone Encounter (Signed)
 Regarding: Clinical Question  ----- Message from [facility] B sent at 11/05/2024  9:47 AM EST -----  Copied From CRM #[id number].DBAIBOU, JANA    [name], Rachel A (Self) called with a clinical question. Pt calling stating she saw provider yesterday and at the time provider asked her if she had a bladder infection. At the time, she did not think so but this morning she has woken up with a painful bladder infection. She states provider offered to prescribe something to treat it. Asking for that to be sent in. Thank you!    [facility] DRUG [facility]ORE #[id number] [place], [place] - [address] AT [facility] OF [address]  [address] [facility] [address]  Phone: [phone] Fax: [phone]  Hours: Not open 24 hours

## 2024-11-11 ENCOUNTER — Other Ambulatory Visit (HOSPITAL_BASED_OUTPATIENT_CLINIC_OR_DEPARTMENT_OTHER): Payer: Self-pay | Admitting: Student in an Organized Health Care Education/Training Program

## 2024-11-11 ENCOUNTER — Ambulatory Visit: Attending: Student in an Organized Health Care Education/Training Program

## 2024-11-11 DIAGNOSIS — R300 Dysuria: Secondary | ICD-10-CM | POA: Insufficient documentation

## 2024-11-11 DIAGNOSIS — M1990 Unspecified osteoarthritis, unspecified site: Secondary | ICD-10-CM

## 2024-11-11 LAB — URINALYSIS, MACROSCOPIC
BILIRUBIN: NEGATIVE mg/dL
BLOOD: NEGATIVE mg/dL
COLOR: NORMAL
GLUCOSE: NEGATIVE mg/dL
KETONES: NEGATIVE mg/dL
LEUKOCYTES: NEGATIVE WBCs/uL
NITRITE: NEGATIVE
PH: 6 (ref 5.0–8.0)
PROTEIN: NEGATIVE mg/dL
SPECIFIC GRAVITY: 1.022 (ref 1.005–1.030)
UROBILINOGEN: 2 mg/dL — AB

## 2024-11-11 LAB — URINALYSIS, MICROSCOPIC
RBCS: 1 /HPF (ref <6.0)
WBCS: 6 /HPF (ref <11.0)

## 2024-11-12 ENCOUNTER — Ambulatory Visit (HOSPITAL_BASED_OUTPATIENT_CLINIC_OR_DEPARTMENT_OTHER): Payer: Self-pay | Admitting: Student in an Organized Health Care Education/Training Program

## 2024-11-12 ENCOUNTER — Other Ambulatory Visit: Payer: Self-pay

## 2024-11-12 MED ORDER — OXYCODONE 10 MG TABLET: 10.0000 mg | ORAL_TABLET | Freq: Four times a day (QID) | ORAL | 0 refills | Status: DC | PRN

## 2024-11-12 NOTE — Telephone Encounter (Signed)
 Last scheduled appointment with the encounter provider was 11/04/2024.  Currently scheduled next future appointment with the encounter provider is 11/13/2024  The next office visit in this department is 11/13/2024    Marijo Holt, RN  11/12/2024, 07:14

## 2024-11-13 ENCOUNTER — Ambulatory Visit (HOSPITAL_BASED_OUTPATIENT_CLINIC_OR_DEPARTMENT_OTHER)

## 2024-11-13 ENCOUNTER — Encounter (HOSPITAL_BASED_OUTPATIENT_CLINIC_OR_DEPARTMENT_OTHER): Payer: Self-pay | Admitting: Student in an Organized Health Care Education/Training Program

## 2024-11-13 ENCOUNTER — Ambulatory Visit
Payer: Self-pay | Attending: Student in an Organized Health Care Education/Training Program | Admitting: Student in an Organized Health Care Education/Training Program

## 2024-11-13 VITALS — BP 128/64 | HR 97 | Temp 96.8°F | Ht 63.0 in | Wt 108.5 lb

## 2024-11-13 DIAGNOSIS — F32A Depression, unspecified: Secondary | ICD-10-CM

## 2024-11-13 DIAGNOSIS — G629 Polyneuropathy, unspecified: Secondary | ICD-10-CM

## 2024-11-13 DIAGNOSIS — Z681 Body mass index (BMI) 19 or less, adult: Secondary | ICD-10-CM

## 2024-11-13 DIAGNOSIS — R634 Abnormal weight loss: Secondary | ICD-10-CM | POA: Insufficient documentation

## 2024-11-13 DIAGNOSIS — H547 Unspecified visual loss: Secondary | ICD-10-CM

## 2024-11-13 DIAGNOSIS — M792 Neuralgia and neuritis, unspecified: Secondary | ICD-10-CM | POA: Insufficient documentation

## 2024-11-13 DIAGNOSIS — R32 Unspecified urinary incontinence: Secondary | ICD-10-CM | POA: Insufficient documentation

## 2024-11-13 DIAGNOSIS — K5903 Drug induced constipation: Secondary | ICD-10-CM

## 2024-11-13 DIAGNOSIS — T402X5A Adverse effect of other opioids, initial encounter: Secondary | ICD-10-CM

## 2024-11-13 DIAGNOSIS — Z79891 Long term (current) use of opiate analgesic: Secondary | ICD-10-CM

## 2024-11-13 DIAGNOSIS — R0602 Shortness of breath: Secondary | ICD-10-CM | POA: Insufficient documentation

## 2024-11-13 DIAGNOSIS — R200 Anesthesia of skin: Secondary | ICD-10-CM

## 2024-11-13 DIAGNOSIS — F419 Anxiety disorder, unspecified: Secondary | ICD-10-CM

## 2024-11-13 LAB — CBC WITH DIFF
BASOPHIL #: <0.1 x10ˆ3/uL (ref <=0.20)
BASOPHIL %: 0.7 %
EOSINOPHIL #: 0.38 x10ˆ3/uL (ref <=0.50)
EOSINOPHIL %: 8.6 %
HCT: 35 % (ref 34.8–46.0)
HGB: 11.3 g/dL — ABNORMAL LOW (ref 11.5–16.0)
IMMATURE GRANULOCYTE #: <0.1 x10ˆ3/uL (ref <0.10)
IMMATURE GRANULOCYTE %: 0.2 % (ref 0.0–1.0)
LYMPHOCYTE #: 0.69 x10ˆ3/uL — ABNORMAL LOW (ref 1.00–4.80)
LYMPHOCYTE %: 15.6 %
MCH: 29.7 pg (ref 26.0–32.0)
MCHC: 32.3 g/dL (ref 31.0–35.5)
MCV: 92.1 fL (ref 78.0–100.0)
MONOCYTE #: 0.35 x10ˆ3/uL (ref 0.20–1.10)
MONOCYTE %: 7.9 %
MPV: 9.8 fL (ref 8.7–12.5)
NEUTROPHIL #: 2.96 x10ˆ3/uL (ref 1.50–7.70)
NEUTROPHIL %: 67 %
PLATELETS: 194 x10ˆ3/uL (ref 150–400)
RBC: 3.8 x10ˆ6/uL — ABNORMAL LOW (ref 3.85–5.22)
RDW-CV: 14.2 % (ref 11.5–15.5)
WBC: 4.4 x10ˆ3/uL (ref 3.7–11.0)

## 2024-11-13 LAB — THYROXINE, FREE (FREE T4): THYROXINE (T4), FREE: 0.9 ng/dL (ref 0.70–1.48)

## 2024-11-13 LAB — COMPREHENSIVE METABOLIC PANEL, NON-FASTING
ALBUMIN: 4.1 g/dL (ref 3.4–4.8)
ALKALINE PHOSPHATASE: 52 U/L — ABNORMAL LOW (ref 55–145)
ALT (SGPT): 14 U/L (ref <31)
ANION GAP: 5 mmol/L (ref 4–13)
AST (SGOT): 25 U/L (ref 11–34)
BILIRUBIN TOTAL: 0.2 mg/dL — ABNORMAL LOW (ref 0.3–1.3)
BUN/CREA RATIO: 22 (ref 6–22)
BUN: 18 mg/dL (ref 8–25)
CALCIUM: 8.9 mg/dL (ref 8.6–10.3)
CHLORIDE: 103 mmol/L (ref 96–111)
CO2 TOTAL: 30 mmol/L (ref 23–31)
CREATININE: 0.82 mg/dL (ref 0.60–1.05)
GLUCOSE: 86 mg/dL (ref 65–125)
POTASSIUM: 4.9 mmol/L (ref 3.7–5.3)
PROTEIN TOTAL: 6.9 g/dL (ref 5.6–7.6)
SODIUM: 138 mmol/L (ref 136–145)
eGFRcr - FEMALE: 71 mL/min/1.73mˆ2 (ref >=60)

## 2024-11-13 LAB — THYROID STIMULATING HORMONE WITH FREE T4 REFLEX: TSH: 1.053 u[IU]/mL (ref 0.350–4.940)

## 2024-11-16 ENCOUNTER — Ambulatory Visit (HOSPITAL_BASED_OUTPATIENT_CLINIC_OR_DEPARTMENT_OTHER): Payer: Self-pay | Admitting: Student in an Organized Health Care Education/Training Program

## 2024-11-16 NOTE — Progress Notes (Signed)
 INTERNAL MEDICINE   [facility]  Return Visit Progress Note     Name: Rachel [name] Date of Service: 11/16/2024   MRN:  [id number]  Age/DOB: 83 y.o.1941/03/05   PCP:  Benedict Marble, MD           CHIEF COMPLAINT:   Sinus Infection      SUBJECTIVE:  History of Present Illness  Rachel [name] is an 83 year old female who presents with generalized itching, incontinence, and vision changes.     She experiences generalized tingling, particularly severe since yesterday, affecting her legs, feet, and hands. She associates this sensation with her neuropathy.    She has urinary incontinence, needing to urinate every hour. She is currently taking Vesicare .    She reports a sudden change in her vision since yesterday, with difficulty focusing and a sensation as though something is on her glasses or right eye. She recently received new glasses, which initially improved her vision until the recent change.    She describes a general feeling of being 'out of sync' and not feeling well overall, which started yesterday. No weakness on one side or facial droop. She has a history of depression and has been in contact with counselors.    She reports significant weight loss, having lost 14 pounds over the past six months, attributed to stress and decreased appetite. She is trying to maintain her nutrition with Ensure and yogurt. Her nausea has resolved.    She experiences shortness of breath, particularly when walking, which has worsened over the past month. No chest pain but mentions breast pain, which she massages to check for lumps.    She has a history of falls but has not fallen recently. She is concerned about her balance and does not use a cane due to her husband's preferences.    OBJECTIVE:  Current Outpatient Medications   Medication Sig    azelastine  (ASTELIN ) 137 mcg (0.1 %) Nasal Spray, Non-Aerosol Administer 1 Spray into each nostril Twice daily for 180 days Use in each nostril as directed Indications:  seasonal runny nose (Patient not taking: Reported on 11/13/2024)    bisacodyL  (DULCOLAX) 5 mg Oral Tablet, Delayed Release (E.C.) Take 1 Tablet (5 mg total) by mouth Every 24 hours as needed for Constipation for up to 180 days Indications: constipation    Dexlansoprazole  (DEXILANT ) 60 mg Oral Cap, Delayed Rel., Multiphasic Take 1 Capsule (60 mg total) by mouth Daily Indications: gastroesophageal reflux disease, heartburn    DULoxetine  (CYMBALTA  DR) 30 mg Oral Capsule, Delayed Release(E.C.) Take 1 Capsule (30 mg total) by mouth Daily    fluticasone  propionate (FLONASE ) 50 mcg/actuation Nasal Spray, Suspension Administer 1 Spray into each nostril Once a day    fluticasone  propionate (FLONASE ) 50 mcg/actuation Nasal Spray, Suspension Administer 1 Spray into each nostril Daily    lactobacillus acidoph-pectin (ACIDOPHILUS-PECTIN) 75 million cell -100 mg Oral Capsule Take 1 Capsule by mouth Daily    Oxycodone  (ROXICODONE ) 10 mg Oral Tablet Take 1 Tablet (10 mg total) by mouth Every 6 hours as needed for Pain for up to 30 days Indications: pain, osteoarthritis    solifenacin  (VESICARE ) 10 mg Oral Tablet Take 1 Tablet (10 mg total) by mouth Daily    vibegron  (GEMTESA ) 75 mg Oral Tablet Take 1 Tablet (75 mg total) by mouth Every night     Past Medical History:   Diagnosis Date    Chronic pain     Depression  GERD (gastroesophageal reflux disease)     Low back pain     Memory changes     Migraine     Neck problem     Osteoarthritis     Shortness of breath     Wears glasses          Past Surgical History:   Procedure Laterality Date    ANTERIOR FUSION CERVICAL SPINE  12/30/2020    BREAST IMPLANT REMOVAL Bilateral     2022    CYSTOURETHROSCOPY      FOOT SURGERY Bilateral     HX BREAST AUGMENTATION Bilateral     x3, 1974, 1994, 2014    HX CATARACT REMOVAL Bilateral 01/12/2021    HX CERVICAL SPINE SURGERY      HX ROTATOR CUFF REPAIR Right     SACROILIAC JOINT INJECTION      TOE AMPUTATION           BP 128/64   Pulse 97    Temp 36 C (96.8 F)   Ht 1.6 m (5' 3)   Wt 49.2 kg (108 lb 7.5 oz)   SpO2 98%   BMI 19.21 kg/m       Physical Exam    Aox3  No acute distress  Well groomed  Breathing unlabored  No abdominal distention  Unlabored breathing  No notable rashes      RESULTS:  Results  LABS  Urinalysis: No UTI (11/12/2024)    DIAGNOSTIC  Echocardiogram: Ventricular thickening  Last CBC  (Last result in the past 2 years)        WBC   HGB   HCT   MCV   Platelets      11/13/24 1425 4.4   11.3   35.0   92.1   194            Last BMP  (Last result in the past 2 years)        Na   K   Cl   CO2   BUN   Cr   Calcium   Glucose   Glucose-Fasting        11/13/24 1425 138   4.9   103   30   18   0.82   8.9  Comment: Gadolinium-containing contrast can interfere with calcium measurement.     86               Lab Results   Component Value Date    TSH 1.053 11/13/2024    FREET4 0.90 11/13/2024     Lab Results   Component Value Date    HA1C 5.4 03/03/2024     Lab Results   Component Value Date    TRIG 167 (H) 09/13/2021    HDLCHOL 72 09/13/2021    LDLCHOL 125 (H) 09/13/2021    CHOLESTEROL 230 (H) 09/13/2021       ASSESSMENT/PLAN:  Assessment & Plan  Abnormal weight loss  Significant weight loss of 14 pounds over six months, potentially due to stress, decreased appetite, and dietary changes. Differential includes occult malignancy or other systemic issues.  - Ordered colonoscopy and endoscopy to rule out occult malignancy.  - Encouraged increased caloric intake and frequent snacking.  - Advised to continue with Ensure daily.    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  - Encouraged increased dietary intake to improve bowel movements.    Urinary incontinence  Increased urinary frequency  and incontinence. Currently on Vesicare , with Gemteza recently prescribed but not yet started.  - Start Gemteza in addition to Vesicare .    Peripheral neuropathy  New onset tingling  and numbness, possibly related to a neurological event such as a mini-stroke. Differential includes neurological causes.  - Ordered CT scan of the brain to evaluate for possible stroke.  - Performed blood work to assess for any abnormalities.    Unspecified visual loss  Recent onset of visual disturbances, including difficulty focusing. Differential includes neurological causes.  - Ordered CT scan of the brain to evaluate for possible neurological causes.    Shortness of breath  Exertion, possibly related to deconditioning or cardiac issues. Previous echocardiogram showed mild ventricular thickening, possibly related to hypertension.  - Ordered echocardiogram to assess cardiac function.    Depression and anxiety symptoms  Ongoing depression and anxiety symptoms, possibly exacerbated by recent stressors and health concerns.  - Encouraged follow-up with recommended counselors, including Amy Zyders and [employer].    Benedict Marble, MD  Assistant Professor of Internal Medicine  [facility]  [facility] Medicine

## 2024-11-18 ENCOUNTER — Encounter (INDEPENDENT_AMBULATORY_CARE_PROVIDER_SITE_OTHER): Payer: Self-pay

## 2024-12-08 ENCOUNTER — Other Ambulatory Visit (HOSPITAL_BASED_OUTPATIENT_CLINIC_OR_DEPARTMENT_OTHER): Payer: Self-pay | Admitting: Student in an Organized Health Care Education/Training Program

## 2024-12-08 NOTE — Telephone Encounter (Signed)
 Last scheduled appointment with you was 11/13/2024.    Currently scheduled future appointment is 01/04/2025.    Patient has been seen within the last year: Yes.    Confirmed preferred pharmacy for this refill encounter is   Preferred Pharmacy       [facility] #[id number] - [place], [place] - [address] AT [facility] OF [address]    [address] [facility] [address]    Phone: [phone] Fax: [phone]    Hours: Not open 24 hours    [facility]    [address] [facility] [place] [address]    Phone: [phone] Fax: [phone]    Hours: 24/7        .     Cecillia Devonshire, RN  12/08/2024, 11:35

## 2024-12-08 NOTE — Telephone Encounter (Signed)
 Last scheduled appointment with you was 11/13/2024.    Currently scheduled future appointment is 01/04/2025.    Patient has been seen within the last year: Yes.    Confirmed preferred pharmacy for this refill encounter is   Preferred Pharmacy       [facility] #[id number] - [place], [place] - [address] AT [facility] OF [address]    [address] [facility] [address]    Phone: [phone] Fax: [phone]    Hours: Not open 24 hours    [facility] Pharm[facility]    [address] [facility] [place] [address]    Phone: [phone] Fax: [phone]    Hours: 24/7        .     Cecillia Devonshire, RN  12/08/2024, 11:34

## 2024-12-09 ENCOUNTER — Ambulatory Visit

## 2024-12-09 MED ORDER — DULOXETINE 30 MG CAPSULE,DELAYED RELEASE: 30.0000 mg | DELAYED_RELEASE_CAPSULE | Freq: Every day | ORAL | 1 refills | Status: DC

## 2024-12-09 MED ORDER — BISACODYL 5 MG TABLET,DELAYED RELEASE: 5.0000 mg | DELAYED_RELEASE_TABLET | ORAL | 5 refills | Status: DC | PRN

## 2024-12-11 ENCOUNTER — Other Ambulatory Visit (HOSPITAL_BASED_OUTPATIENT_CLINIC_OR_DEPARTMENT_OTHER): Payer: Self-pay | Admitting: Student in an Organized Health Care Education/Training Program

## 2024-12-11 DIAGNOSIS — M1990 Unspecified osteoarthritis, unspecified site: Secondary | ICD-10-CM

## 2024-12-14 MED ORDER — OXYCODONE 10 MG TABLET: 10.0000 mg | ORAL_TABLET | Freq: Four times a day (QID) | ORAL | 0 refills | Status: DC | PRN

## 2024-12-14 NOTE — Telephone Encounter (Signed)
 Last scheduled appointment with the encounter provider was 11/13/2024.  Currently scheduled next future appointment with the encounter provider is 01/04/2025  The next office visit in this department is 01/04/2025    Marijo Holt, RN  12/14/2024, 07:25

## 2024-12-15 ENCOUNTER — Ambulatory Visit (INDEPENDENT_AMBULATORY_CARE_PROVIDER_SITE_OTHER): Payer: Self-pay | Admitting: Family

## 2024-12-21 ENCOUNTER — Encounter (HOSPITAL_COMMUNITY): Payer: Self-pay

## 2025-01-04 ENCOUNTER — Other Ambulatory Visit: Payer: Self-pay

## 2025-01-04 ENCOUNTER — Ambulatory Visit
Payer: Self-pay | Attending: Student in an Organized Health Care Education/Training Program | Admitting: Student in an Organized Health Care Education/Training Program

## 2025-01-04 ENCOUNTER — Encounter (HOSPITAL_BASED_OUTPATIENT_CLINIC_OR_DEPARTMENT_OTHER): Payer: Self-pay | Admitting: Student in an Organized Health Care Education/Training Program

## 2025-01-04 ENCOUNTER — Ambulatory Visit (HOSPITAL_BASED_OUTPATIENT_CLINIC_OR_DEPARTMENT_OTHER)
Admission: RE | Admit: 2025-01-04 | Discharge: 2025-01-04 | Disposition: A | Discharge status: Home / Self Care | Source: Ambulatory Visit | Attending: Student in an Organized Health Care Education/Training Program | Admitting: Student in an Organized Health Care Education/Training Program

## 2025-01-04 ENCOUNTER — Ambulatory Visit (HOSPITAL_COMMUNITY)
Admission: RE | Admit: 2025-01-04 | Discharge: 2025-01-04 | Disposition: A | Discharge status: Home / Self Care | Source: Ambulatory Visit

## 2025-01-04 VITALS — BP 128/66 | HR 100 | Temp 98.4°F | Ht 63.0 in | Wt 108.0 lb

## 2025-01-04 DIAGNOSIS — R200 Anesthesia of skin: Secondary | ICD-10-CM | POA: Insufficient documentation

## 2025-01-04 DIAGNOSIS — R42 Dizziness and giddiness: Secondary | ICD-10-CM | POA: Insufficient documentation

## 2025-01-04 DIAGNOSIS — M792 Neuralgia and neuritis, unspecified: Secondary | ICD-10-CM

## 2025-01-04 DIAGNOSIS — H547 Unspecified visual loss: Secondary | ICD-10-CM | POA: Insufficient documentation

## 2025-01-04 DIAGNOSIS — F32A Depression, unspecified: Secondary | ICD-10-CM | POA: Insufficient documentation

## 2025-01-04 DIAGNOSIS — G629 Polyneuropathy, unspecified: Secondary | ICD-10-CM

## 2025-01-04 DIAGNOSIS — F339 Major depressive disorder, recurrent, unspecified: Secondary | ICD-10-CM

## 2025-01-04 DIAGNOSIS — R634 Abnormal weight loss: Secondary | ICD-10-CM

## 2025-01-04 DIAGNOSIS — F39 Unspecified mood [affective] disorder: Secondary | ICD-10-CM | POA: Insufficient documentation

## 2025-01-04 DIAGNOSIS — R296 Repeated falls: Secondary | ICD-10-CM | POA: Insufficient documentation

## 2025-01-04 DIAGNOSIS — K219 Gastro-esophageal reflux disease without esophagitis: Secondary | ICD-10-CM | POA: Insufficient documentation

## 2025-01-04 MED ORDER — DULOXETINE 30 MG CAPSULE,DELAYED RELEASE: 60.0000 mg | DELAYED_RELEASE_CAPSULE | Freq: Every day | ORAL | 1 refills | Status: DC

## 2025-01-04 NOTE — Progress Notes (Signed)
 INTERNAL MEDICINE   [facility]  Return Visit Progress Note     Name: Rachel [name] Date of Service: 01/04/2025   MRN:  [id number]  Age/DOB: 84 y.o.1941/06/03   PCP:  Benedict Marble, MD           CHIEF COMPLAINT:   Annual Wellness Exam      SUBJECTIVE:  History of Present Illness  Rachel [name] is an 84 year old female who presents with dizziness and falls.    She has been experiencing dizziness and falls over the past month, with two recent falls. One fall resulted in a broken wrist, and the other caused significant pain in the same arm. She describes episodes of dizziness as 'kind of a blackout' and notes feeling lightheaded during exertion, such as climbing stairs. She also reports difficulty breathing and frequent shortness of breath.    She has a history of GERD and was taking Dexilant , which she stopped recently due to concerns it was causing fatigue and brain fog. She feels better after discontinuing it and prefers to manage her symptoms with over-the-counter options like Maalox or Pepcid . She mentions difficulty with tomato-based foods due to GERD and starts her mornings with Ensure and yogurt, which she finds beneficial despite her husband's criticism.    She discontinued Cymbalta  over a month ago, which she had been taking for depression, anxiety, and nerve pain. She was on a 30 mg dose but had previously been on 60 mg. She feels worse after stopping the medication, with increased fatigue and a sense of giving up after deciding not to move to an assisted living facility in [place].    She describes a challenging social situation, feeling stuck in her current living arrangement with her husband, Rachel [name], who is described as controlling and critical. She wants to move to an assisted living facility in [place], where her children live, but feels unable to do so due to financial and relational constraints. She reports a lack of autonomy in her daily life and hobbies, contributing to her  depressive symptoms.    She notes a decrease in physical activity since moving from [place] , where she used to play tennis, to a smaller house with fewer activities available. She wants to engage in hobbies like needlepoint and reading but feels restricted by her husband's constant presence and criticism.    OBJECTIVE:  Current Outpatient Medications   Medication Sig    azelastine  (ASTELIN ) 137 mcg (0.1 %) Nasal Spray, Non-Aerosol Administer 1 Spray into each nostril Twice daily for 180 days Use in each nostril as directed Indications: seasonal runny nose (Patient not taking: Reported on 01/04/2025)    bisacodyL  (DULCOLAX) 5 mg Oral Tablet, Delayed Release (E.C.) Take 1 Tablet (5 mg total) by mouth Every 24 hours as needed for Constipation for up to 180 days Indications: constipation    Dexlansoprazole  (DEXILANT ) 60 mg Oral Cap, Delayed Rel., Multiphasic Take 1 Capsule (60 mg total) by mouth Daily Indications: gastroesophageal reflux disease, heartburn    DULoxetine  (CYMBALTA  DR) 30 mg Oral Capsule, Delayed Release(E.C.) Take 2 Capsules (60 mg total) by mouth Daily    fluticasone  propionate (FLONASE ) 50 mcg/actuation Nasal Spray, Suspension Administer 1 Spray into each nostril Once a day    fluticasone  propionate (FLONASE ) 50 mcg/actuation Nasal Spray, Suspension Administer 1 Spray into each nostril Daily    lactobacillus acidoph-pectin (ACIDOPHILUS-PECTIN) 75 million cell -100 mg Oral Capsule Take 1 Capsule by mouth Daily    Oxycodone  (ROXICODONE ) 10 mg Oral  Tablet Take 1 Tablet (10 mg total) by mouth Every 6 hours as needed for Pain for up to 30 days Indications: pain, osteoarthritis    solifenacin  (VESICARE ) 10 mg Oral Tablet Take 1 Tablet (10 mg total) by mouth Daily    vibegron  (GEMTESA ) 75 mg Oral Tablet Take 1 Tablet (75 mg total) by mouth Every night     Past Medical History:   Diagnosis Date    Chronic pain     Depression     GERD (gastroesophageal reflux disease)     Low back pain     Memory changes      Migraine     Neck problem     Osteoarthritis     Shortness of breath     Wears glasses          Past Surgical History:   Procedure Laterality Date    ANTERIOR FUSION CERVICAL SPINE  12/30/2020    BREAST IMPLANT REMOVAL Bilateral     2022    CYSTOURETHROSCOPY      FOOT SURGERY Bilateral     HX BREAST AUGMENTATION Bilateral     x3, 1974, 1994, 2014    HX CATARACT REMOVAL Bilateral 01/12/2021    HX CERVICAL SPINE SURGERY      HX ROTATOR CUFF REPAIR Right     SACROILIAC JOINT INJECTION      TOE AMPUTATION           BP 128/66 (Site: Left Arm, Patient Position: Sitting, Cuff Size: Adult)   Pulse 100   Temp 36.9 C (98.4 F) (Thermal Scan)   Ht 1.6 m (5' 3)   Wt 49 kg (108 lb 0.4 oz)   SpO2 97%   BMI 19.14 kg/m       Physical Exam    Aox3  No acute distress  Well groomed  No abdominal distention  Unlabored breathing  No notable rashes      RESULTS:  Results  Electrocardiogram (EKG)  Last CBC  (Last result in the past 2 years)        WBC   HGB   HCT   MCV   Platelets      11/13/24 1425 4.4   11.3   35.0   92.1   194            Last BMP  (Last result in the past 2 years)        Na   K   Cl   CO2   BUN   Cr   Calcium   Glucose   Glucose-Fasting        11/13/24 1425 138   4.9   103   30   18   0.82   8.9  Comment: Gadolinium-containing contrast can interfere with calcium measurement.     86               Lab Results   Component Value Date    TSH 1.053 11/13/2024    FREET4 0.90 11/13/2024     Lab Results   Component Value Date    HA1C 5.4 03/03/2024     Lab Results   Component Value Date    TRIG 167 (H) 09/13/2021    HDLCHOL 72 09/13/2021    LDLCHOL 125 (H) 09/13/2021    CHOLESTEROL 230 (H) 09/13/2021       ASSESSMENT/PLAN:  Assessment & Plan  Major depressive disorder  Discontinued Cymbalta  due to spouse's influence, leading to increased fatigue and depressive  symptoms. Discussed autonomy in medical decisions and potential benefits of resuming Cymbalta .  - Prescribed Cymbalta  30 mg daily with option to increase to 60  mg if needed.  - Encouraged resumption of Cymbalta  to improve mood and manage neuropathic pain.  - Discussed strategies to manage spouse's influence on medication decisions.    Gastroesophageal reflux disease  Reports improvement in symptoms without medication. Discussed alternative over-the-counter options for managing GERD symptoms.  - Recommended over-the-counter options like Maalox or Pepcid  if GERD symptoms recur.    Recurrent falls and dizziness  Reports two falls in the past month, with dizziness and shortness of breath. Concerns about potential cardiac causes. Upcoming echocardiogram scheduled. Discussed potential dehydration and inadequate nutrition as contributing factors.  - Ordered Holter monitor for 7-day cardiac monitoring.  - Performed EKG in office.  - Encouraged adequate hydration and nutrition.    Peripheral neuropathy  Reports tenderness in hands, possibly related to arthritis. Cymbalta  may help with neuropathic pain.  - Encouraged resumption of Cymbalta  to manage neuropathic pain.    Abnormal weight loss  Reports ongoing weight loss. Discussed dietary habits and importance of adequate nutrition.  - Encouraged consumption of yogurt and Ensure for protein and calcium intake.    Benedict Marble, MD  Assistant Professor of Internal Medicine  [facility]  [facility]

## 2025-01-05 DIAGNOSIS — R200 Anesthesia of skin: Secondary | ICD-10-CM

## 2025-01-05 DIAGNOSIS — H547 Unspecified visual loss: Secondary | ICD-10-CM

## 2025-01-13 LAB — ECG 12-LEAD (UTC ONLY)
Atrial Rate: 80 {beats}/min
Calculated P Axis: 44 degrees
Calculated R Axis: -55 degrees
Calculated T Axis: 35 degrees
PR Interval: 136 ms
QRS Duration: 84 ms
QT Interval: 402 ms
QTC Calculation: 463 ms
Ventricular rate: 80 {beats}/min

## 2025-01-14 ENCOUNTER — Ambulatory Visit (HOSPITAL_BASED_OUTPATIENT_CLINIC_OR_DEPARTMENT_OTHER): Payer: Self-pay | Admitting: Student in an Organized Health Care Education/Training Program

## 2025-01-15 ENCOUNTER — Other Ambulatory Visit (HOSPITAL_BASED_OUTPATIENT_CLINIC_OR_DEPARTMENT_OTHER): Payer: Self-pay | Admitting: Student in an Organized Health Care Education/Training Program

## 2025-01-15 DIAGNOSIS — M1990 Unspecified osteoarthritis, unspecified site: Secondary | ICD-10-CM

## 2025-01-15 DIAGNOSIS — R110 Nausea: Secondary | ICD-10-CM

## 2025-01-15 DIAGNOSIS — K219 Gastro-esophageal reflux disease without esophagitis: Secondary | ICD-10-CM

## 2025-01-15 DIAGNOSIS — T7840XA Allergy, unspecified, initial encounter: Secondary | ICD-10-CM

## 2025-01-15 NOTE — Telephone Encounter (Signed)
 Regarding: Refill Request  ----- Message from Almarie SAILOR sent at 01/15/2025  2:15 PM EST -----  Copied From CRM #[id number].Doctor Name:  EDGARDO POINTER       Date of last appointment: 01/05/2024    Next scheduled visit: 03/15/2025    Medication Requested: Oxycodone  (ROXICODONE ) 10 mg Oral Tablet      Preferred Pharmacy     [id number] - [place], [place] - [address] AT   [facility] [address]    [address] [facility] [address]    Phone: [phone] Fax: [phone]    Hours: Not open 24 hours      Notes for Nurse or Physician: Pt is out of medication and asking if we can please send this in today so she can pick it up before the storm. Thank you!

## 2025-01-15 NOTE — Telephone Encounter (Addendum)
 Medical Group Practice  [address]  [place], [place] [address]       PATIENT NAME:  Rachel [name]  MRN: [id number]  DOB: [birth year]        Date: 01/15/2025  Time: 14:38      Spoke with patient and she stated that she is out of oxycodone  and isn't sure how she ran out without knowing. Stated she was unable to refill Gemtesa , will call [facility] regarding this medication. Stated she also needs refill on Dexilant . Patient stated that she is having a hard time managing her medications. Asked patient if she would be agreeable for pill packs, patient stated she has been trying to get them. Stated she received her heart monitor in the mail but is unable to understand the included instructions, provided number for [facility] to get proper directions. Patient stated if [facility] does not have stock of Gemtesa  we can sent it to [facility].   Pended order for [facility] for possible help with medication management.     Called [facility] and they stated that they have refills available for both the Dexilant  and Gemtesa .      Delon List, RN  Clinical Nurse Coordinator

## 2025-01-15 NOTE — Telephone Encounter (Signed)
 Medical Group Practice  [address]  [place], [place] [address]        Pain contract on file/ up to date? yes  Urine drug screen within the last year? no  Appointment with prescribing provider within the last three months? yes  Last office visit: 01/04/2025  Next office visit: 03/15/2025    Delon List, NURSE COORDINATOR

## 2025-01-16 MED ORDER — OXYCODONE 10 MG TABLET: 10.0000 mg | ORAL_TABLET | Freq: Four times a day (QID) | ORAL | 0 refills | Status: DC | PRN

## 2025-01-18 ENCOUNTER — Encounter (INDEPENDENT_AMBULATORY_CARE_PROVIDER_SITE_OTHER): Payer: Self-pay

## 2025-01-18 ENCOUNTER — Other Ambulatory Visit (INDEPENDENT_AMBULATORY_CARE_PROVIDER_SITE_OTHER): Payer: Self-pay

## 2025-01-18 DIAGNOSIS — Z7189 Other specified counseling: Secondary | ICD-10-CM

## 2025-01-18 NOTE — Nursing Note (Signed)
 POPULATION HEALTH    COMPLEX CARE MANAGEMENT  IDENTIFIED FOR ENROLLMENT      Pt identified through provider referral. [facility] briefly reviewed chart. Pt meets eligibility for program enrollment. DMC to contact to explain benefits of program and offer the opportunity to enroll. Plan to complete an assessment / review care gaps once consent is obtained.      Case Manager To Do List for the Next Interaction:  Contact pt to explain benefits of program and enrollment  Obtain Consent  Discuss Care Gaps  Assemble and send out a welcome folder      Camelia Lunger, RN, BSN, CCM, CPAHA-TT - Disease Management Coordinator  Population Health Department  [facility]  Medical Group Practice  Phone: [phone]

## 2025-01-19 ENCOUNTER — Other Ambulatory Visit (INDEPENDENT_AMBULATORY_CARE_PROVIDER_SITE_OTHER): Payer: Self-pay

## 2025-01-19 DIAGNOSIS — Z7189 Other specified counseling: Secondary | ICD-10-CM

## 2025-01-19 NOTE — Nursing Note (Signed)
 POPULATION HEALTH    COMPLEX CARE MANAGEMENT  ENROLLMENT    Chronic Care Management - CCM  Status: Enrolled  Effective Dates: 01/19/2025 - present  Responsible Staff: Gladis Salines, RN        Reason for Enrollment:  Rachel [name] is a 84 y.o. female who was enrolled into Chronic Care Management for the following reason: Referred by provider [1]  . I spoke to the patient by phone .   Spoke with patient via telephone regarding Chronic Care Management Program. [facility] introduced self, reason for call, and ensured patient was aware call was being recorded for quality. Discussed Chronic Care Management Program and the patient is agreeable to enrollment.  I obtained verbal consent. I have verified their address as: [address]  [place] [address]. I have verified their telephone number:   Home Phone [phone]   Work Phone [phone]   Mobile [phone]     The care manager is monitoring the following disease processes through a care plan: Osteoarthritis. Patient is being monitored by PCP for the following conditions: Problem List[1]. Patient concerns are exacerbation of disease and increase risk of falls.      Problem List[2]   Current Medications[3]  Social History[4]    Patient Reports:    [facility] was able to make contact with the pt today and offered enrollment in CCM service. Pt agreeable to terms of CCM program.    CV  HLD - not currently being treated, would need updated labs  Lab Results   Component Value Date    CHOLESTEROL 230 (H) 09/13/2021    HDLCHOL 72 09/13/2021    LDLCHOL 125 (H) 09/13/2021    TRIG 167 (H) 09/13/2021      Dizziness and Fall - Wearing a heart monitor now, EKG was normal, and TTE is scheduled for 2/12 to r/o cardiac etiology. Position changes precedes the dizziness and falls (bending over or standing from sitting). Mentioned that her glucose will occasionally drop into 60's. States this has happened all my life. Feels better after she eats/drinks something.      MSK  OA  in Hands - Has chronic pain that leaves her unable to be as active as she would like to be and unable to do some of that hobbies she enjoys (needle point).      EYES  Decreased Visual Field - Recently had eye exam. Feels the new prescription is not correct and in the process of having a new pair of lens made. Unclear if she only has reduce visual acuity or truly a field deficit.      GU  Urinary Incontinence - wears briefs, taking Vesicare  and Gemtesa .      GI  GERD - Controlled on Dexilant  and avoiding trigger foods (tomato based foods). 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 Tries to set up a pill box, but OA in her hands makes it difficult to open the bottles.    Migraines - not addressed this outreach    Mood - Feels anxious about missing her medication doses, finances, husbands health, and the state of the  country. States her husband reassures her that they are fine financially and not the worry. Has good social support from friends. Has a daughter and grandchild(ren) that live in [place]. Would like to resume Cymbalta  in the pill packs. States her husband in opposed to her taking it. Reports that he is paternalistic and critical of her behavior and choices. Shared that he is 83 years old, wonders how she will function without him.    Hypersomnia - not addressed this outreach, may be contributing to memory deficit.      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 Kenna office in [place].    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    F/U scheduled - unknown      Care  Gaps  Vaccines:  RSV    AWV - needs scheduled    PCP f/u 3/23    Patient has the following DME at home:  BP Cuff, Glucometer - uses husband's meter, and Shower Chair    Home Health:  No past utilization of services    Care Management Tasks Completed:  [x] Chart review completed.  [x] Full intake assessment completed.  [x] Social determinants of health completed.  [x] The care plan(s) initiated.  [x] Goals addressed and updated.  [x] The following education completed, as noted below.   [x] The following clinical references handout was provided, as noted below.  [x] Self management plan initiated and provided to patient.  See patient care coordination note.  [x] Barriers to health, care plan, and goals identified.  Interventions addressed below as applicable.  [x] Provided patient with direct contact information and 24 hour nurse navigator number.  [x] Provided patient with enrollment pamphlet, business card, and enrollment letter.  [x] The following Health maintenance/care gaps reviewed and discussed with patient.  AWV needs scheduled with Landry Mon, CNP. Health maintenance/care gaps updated as applicable.   [x] Discussed upcoming appointment dates and times.  Reinforced importance of keeping all provider appointments.  Future Appointments   Date Time Provider Department Center   01/20/2025  1:00 PM Fannie Domino, APRN, CNP GASTROENTEROLOGY-POC PHYSICIAN OF   02/04/2025  8:00 AM RUBY ECHO 3 CLINIC TECH HVIS-[facility]   02/16/2025 12:00 PM BCC MM1 MAMMOGRAPHY BP[facility]CC BETTY PUSKAR   02/16/2025 12:45 PM Plavi, Katlin, PA-C BREAST SURG-[facility]   03/15/2025 11:00 AM Edgardo Pointer, MD MGP-[facility] Shelly T   09/07/2025  1:10 PM Filbert Gun, APRN, CNP DERMATOLOGY-[facility]   10/07/2025  1:15 PM Constantine Collar, DPM PODIATRY-[facility]     [x] Reinforced the importance of taking all medications as prescribed.  [x] Medical history, surgical history, hospitalizations, and medications reviewed and updated as  applicable.    Education Completed  Education Documentation  coping strategies, taught by Gladis Salines, RN at 01/20/2025  8:49 AM.  Learner: Patient  Readiness: Acceptance  Method: Explanation  Response: Verbalizes Understanding    Education Comments  No comments found.    Clinical References Handout Provided  Self-Management Plan - Depression Mgt, Chronic Pain Mgt, and Understanding Carpometacarpal Arthritis   Clinical Reference - Understanding Opioid Medications for Pain Mgt    Care Plan in Progress  Osteoarthritis    ASSESSMENT INTERVENTIONS:  SDOH  CCM SDOH: No needs identified.  HUD - No  SNAP -No    Community Resources  Patient denies any needs at this time  Meals-On-Wheels - Yes, 3 x  week  ADWP - No  HH - No  Housekeeper - to start next week MWF, pays OOP    Visual & Hearing  Patient has glasses and/or hearing aids    ADLs  Patient is independent with ADL    Life Planning Activities  Copy of advanced directives requested from pt    Cultural and Linguistic Needs  Patient denies any cultural needs at this time   Patient denies any Linquistic needs at this time     Caregiver Resources:  Patient denies the use of caregivers at this time     Behavioral Health Status  Husband is opposed to her taking Cymbalta . Per pt, he tells her that it is for schizophrenics and she is not a schizophrenic. Pt wishes to restart medication for the mood and pain benefits it offers.    Assessment of Benefits:  Healthcare Benefits:[employer]  Healthcare Benefits Assessment:Assessed and adequate to cover care  Healthcare Benefits: Value Based Agreement     Additional Interventions:  Patient educated about nurse navigator 24 hour triage line.  Pended and routed medication refill request to PCP - pt requesting pill packs through [facility]  Cymbalta  - education provided benefits of mood and pain  Dizziness - encouraged to change positions slowly and stay hydrated, and increase food intake    Case Manager To Do List for the  Next Interaction:  Monthly outreachF/U on pill packs  Assist with EGD scheduling  Fall safety plan       Plan to call patient in ~ one month to reassess and update plan of care.  Instructed patient to call with change in symptoms or as needed prior to next follow up.        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    Foot pain, bilateral    History of foot surgery    Pain in toes of both feet    Tailor's bunion of right foot    Valgus deformity of both great toes    Fat pad atrophy of foot    Mood disorder (CMS HCC)    Other nonthrombocytopenic purpura    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)   [2]   Patient Active Problem List  Diagnosis    Healthcare maintenance    Visual impairment    Dysphagia    Hypoglycemia    Malnutrition    Osteoarthritis    Migraine    Left-sided chest wall pain    Pre-syncope    Spider veins    Thick nasal mucus    uds 09/29/21 11/23/21, 12/14/2021    Memory deficit    Cervicogenic headache    Medication overuse headache    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    Foot pain, bilateral  History of foot surgery    Pain in toes of both feet    Tailor's bunion of right foot    Valgus deformity of both great toes    Fat pad atrophy of foot    Mood disorder (CMS HCC)    Other nonthrombocytopenic purpura    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)   [3]   Current Outpatient Medications   Medication Sig Dispense  Refill    bisacodyL  (DULCOLAX) 5 mg Oral Tablet, Delayed Release (E.C.) Take 1 Tablet (5 mg total) by mouth Every 24 hours as needed for Constipation for up to 180 days Indications: constipation 30 Tablet 5    Dexlansoprazole  (DEXILANT ) 60 mg Oral Cap, Delayed Rel., Multiphasic Take 1 Capsule (60 mg total) by mouth Daily Indications: gastroesophageal reflux disease, heartburn 90 Capsule 3    DULoxetine  (CYMBALTA  DR) 30 mg Oral Capsule, Delayed Release(E.C.) Take 2 Capsules (60 mg total) by mouth Daily 180 Capsule 1    fluticasone  propionate (FLONASE ) 50 mcg/actuation Nasal Spray, Suspension Administer 1 Spray into each nostril Daily 48 g 3    lactobacillus acidoph-pectin (ACIDOPHILUS-PECTIN) 75 million cell -100 mg Oral Capsule Take 1 Capsule by mouth Daily 90 Capsule 1    mv-min/iron/folic/calcium/vitK (WOMEN'S MULTIVITAMIN ORAL) Take 1 Each by mouth Once a day      Oxycodone  (ROXICODONE ) 10 mg Oral Tablet Take 1 Tablet (10 mg total) by mouth Every 6 hours as needed for Pain for up to 30 days Indications: pain, osteoarthritis 120 Tablet 0    solifenacin  (VESICARE ) 10 mg Oral Tablet Take 1 Tablet (10 mg total) by mouth Daily 90 Tablet 3    vibegron  (GEMTESA ) 75 mg Oral Tablet Take 1 Tablet (75 mg total) by mouth Every night 90 Tablet 3     Current Facility-Administered Medications   Medication Dose Route Frequency Provider Last Rate Last Admin    lidocaine  4 % + phenylephrine  1 % (1:1) nasal solution  5 Drop INTRANASAL Once Makary, Chadi, MD       [4]   Social History  Tobacco Use    Smoking status: Never    Smokeless tobacco: Never   Vaping Use    Vaping status: Never Used   Substance Use Topics    Alcohol use: Not Currently    Drug use: Not Currently     Frequency: 5.0 times per week     Types: Marijuana     Comment: medical marijuana gel

## 2025-01-20 ENCOUNTER — Encounter (INDEPENDENT_AMBULATORY_CARE_PROVIDER_SITE_OTHER): Payer: Self-pay

## 2025-01-20 ENCOUNTER — Other Ambulatory Visit: Payer: Self-pay

## 2025-01-20 ENCOUNTER — Other Ambulatory Visit (INDEPENDENT_AMBULATORY_CARE_PROVIDER_SITE_OTHER): Payer: Self-pay

## 2025-01-20 ENCOUNTER — Ambulatory Visit (INDEPENDENT_AMBULATORY_CARE_PROVIDER_SITE_OTHER): Payer: Self-pay | Admitting: Family

## 2025-01-20 DIAGNOSIS — K219 Gastro-esophageal reflux disease without esophagitis: Secondary | ICD-10-CM

## 2025-01-20 DIAGNOSIS — Z7189 Other specified counseling: Secondary | ICD-10-CM

## 2025-01-20 DIAGNOSIS — R110 Nausea: Secondary | ICD-10-CM

## 2025-01-20 DIAGNOSIS — N3010 Interstitial cystitis (chronic) without hematuria: Secondary | ICD-10-CM

## 2025-01-20 DIAGNOSIS — M1990 Unspecified osteoarthritis, unspecified site: Secondary | ICD-10-CM

## 2025-01-20 MED ORDER — DEXLANSOPRAZOLE 60 MG CAPSULE,BIPHASE DELAYED RELEASE
60.0000 mg | DELAYED_RELEASE_CAPSULE | Freq: Every day | ORAL | 3 refills | Status: AC
  Filled 2025-01-20 (×2): qty 30, 30d supply, fill #0

## 2025-01-20 MED ORDER — FLUTICASONE PROPIONATE 50 MCG/ACTUATION NASAL SPRAY,SUSPENSION
1.0000 | Freq: Every day | NASAL | 3 refills | Status: AC
  Filled 2025-01-20: qty 16, 60d supply, fill #0

## 2025-01-20 MED ORDER — DULOXETINE 30 MG CAPSULE,DELAYED RELEASE
30.0000 mg | DELAYED_RELEASE_CAPSULE | Freq: Every day | ORAL | 1 refills | Status: AC
  Filled 2025-01-20 (×2): qty 30, 30d supply, fill #0

## 2025-01-20 MED ORDER — OXYCODONE 10 MG TABLET
10.0000 mg | ORAL_TABLET | Freq: Four times a day (QID) | ORAL | 0 refills | Status: AC | PRN
  Filled 2025-01-20: qty 120, 30d supply, fill #0

## 2025-01-20 MED ORDER — GEMTESA 75 MG TABLET
1.0000 | ORAL_TABLET | Freq: Every evening | ORAL | 3 refills | Status: DC
  Filled 2025-01-20 (×2): qty 30, 30d supply, fill #0

## 2025-01-20 MED ORDER — SOLIFENACIN 10 MG TABLET
10.0000 mg | ORAL_TABLET | Freq: Every day | ORAL | 3 refills | Status: DC
  Filled 2025-01-20 (×2): qty 30, 30d supply, fill #0

## 2025-01-20 MED ORDER — BISACODYL 5 MG TABLET,DELAYED RELEASE
5.0000 mg | DELAYED_RELEASE_TABLET | ORAL | 5 refills | Status: AC | PRN
  Filled 2025-01-20: qty 30, 30d supply, fill #0

## 2025-01-20 NOTE — Nursing Note (Signed)
 POPULATION HEALTH    COMPLEX CARE MANAGEMENT  CARE COORDINATION        DMC pended medications and routed to PCP for review and signature.    [facility] messaged [facility] making them aware of a new pill pack pt and that she has an outside provider that prescribes 2 medications.      Camelia Lunger, BSN, RN, CCM, [employer] - Disease Special Educational Needs Teacher Health Department  [facility]  Medical Group Practice  Phone: [phone]

## 2025-01-21 ENCOUNTER — Other Ambulatory Visit: Payer: Self-pay

## 2025-01-22 ENCOUNTER — Other Ambulatory Visit: Payer: Self-pay | Attending: Student in an Organized Health Care Education/Training Program

## 2025-01-22 DIAGNOSIS — E162 Hypoglycemia, unspecified: Secondary | ICD-10-CM | POA: Insufficient documentation

## 2025-01-22 DIAGNOSIS — H547 Unspecified visual loss: Secondary | ICD-10-CM | POA: Insufficient documentation

## 2025-01-22 DIAGNOSIS — M2550 Pain in unspecified joint: Secondary | ICD-10-CM | POA: Insufficient documentation

## 2025-01-22 DIAGNOSIS — Z7189 Other specified counseling: Secondary | ICD-10-CM | POA: Insufficient documentation

## 2025-01-22 DIAGNOSIS — R1310 Dysphagia, unspecified: Secondary | ICD-10-CM | POA: Insufficient documentation

## 2025-01-22 DIAGNOSIS — N3941 Urge incontinence: Secondary | ICD-10-CM | POA: Insufficient documentation

## 2025-01-22 DIAGNOSIS — M150 Primary generalized (osteo)arthritis: Secondary | ICD-10-CM | POA: Insufficient documentation

## 2025-01-22 DIAGNOSIS — G8929 Other chronic pain: Secondary | ICD-10-CM | POA: Insufficient documentation

## 2025-01-22 DIAGNOSIS — F39 Unspecified mood [affective] disorder: Secondary | ICD-10-CM | POA: Insufficient documentation

## 2025-01-23 ENCOUNTER — Other Ambulatory Visit (HOSPITAL_BASED_OUTPATIENT_CLINIC_OR_DEPARTMENT_OTHER): Payer: Self-pay | Admitting: INTERNAL MEDICINE

## 2025-01-25 ENCOUNTER — Other Ambulatory Visit: Payer: Self-pay

## 2025-01-26 ENCOUNTER — Ambulatory Visit (HOSPITAL_BASED_OUTPATIENT_CLINIC_OR_DEPARTMENT_OTHER): Payer: Self-pay | Admitting: Student in an Organized Health Care Education/Training Program

## 2025-01-27 NOTE — Telephone Encounter (Signed)
 Medical Group Practice  [address]  [place], [place] [address]       PATIENT NAME: Rachel [name]  MRN: [id number]  DOB: 01-Feb-1941        Date: 01/27/2025  Time: 11:13      Called and informed the patient of Dr. Lael message. Patient states she is not currently having chest pain and is not out of breath this morning. Patient understands it is our recommendation for her to be evaluated in the Emergency Department if she develops chest pain and/or shortness of breath prior to her visit.      Sotero Balling, BSN RN  Clinical Nurse Coordinator

## 2025-01-28 ENCOUNTER — Other Ambulatory Visit: Payer: Self-pay

## 2025-01-28 ENCOUNTER — Ambulatory Visit: Payer: Self-pay | Admitting: Student in an Organized Health Care Education/Training Program

## 2025-01-28 ENCOUNTER — Encounter (HOSPITAL_BASED_OUTPATIENT_CLINIC_OR_DEPARTMENT_OTHER): Payer: Self-pay | Admitting: Student in an Organized Health Care Education/Training Program

## 2025-01-28 VITALS — BP 128/82 | HR 82 | Temp 96.4°F | Ht 63.0 in | Wt 107.4 lb

## 2025-01-28 DIAGNOSIS — K219 Gastro-esophageal reflux disease without esophagitis: Secondary | ICD-10-CM

## 2025-01-28 DIAGNOSIS — R634 Abnormal weight loss: Secondary | ICD-10-CM

## 2025-01-28 DIAGNOSIS — N3941 Urge incontinence: Secondary | ICD-10-CM

## 2025-01-28 DIAGNOSIS — R42 Dizziness and giddiness: Secondary | ICD-10-CM

## 2025-01-28 DIAGNOSIS — M792 Neuralgia and neuritis, unspecified: Secondary | ICD-10-CM

## 2025-01-28 DIAGNOSIS — K5900 Constipation, unspecified: Secondary | ICD-10-CM

## 2025-01-28 DIAGNOSIS — F329 Major depressive disorder, single episode, unspecified: Secondary | ICD-10-CM

## 2025-02-04 ENCOUNTER — Ambulatory Visit (HOSPITAL_COMMUNITY): Payer: Self-pay

## 2025-02-16 ENCOUNTER — Ambulatory Visit (HOSPITAL_BASED_OUTPATIENT_CLINIC_OR_DEPARTMENT_OTHER): Payer: Self-pay | Admitting: Medical

## 2025-02-16 ENCOUNTER — Ambulatory Visit (HOSPITAL_COMMUNITY): Payer: Self-pay

## 2025-03-15 ENCOUNTER — Ambulatory Visit (HOSPITAL_BASED_OUTPATIENT_CLINIC_OR_DEPARTMENT_OTHER): Payer: Self-pay | Admitting: Student in an Organized Health Care Education/Training Program

## 2025-09-07 ENCOUNTER — Ambulatory Visit (HOSPITAL_BASED_OUTPATIENT_CLINIC_OR_DEPARTMENT_OTHER): Payer: Self-pay | Admitting: Family

## 2025-10-07 ENCOUNTER — Encounter (INDEPENDENT_AMBULATORY_CARE_PROVIDER_SITE_OTHER): Payer: Self-pay | Admitting: Podiatrist
